# Patient Record
Sex: Female | Born: 1980
Health system: Southern US, Community
[De-identification: ages and names within clinical notes are randomized; demographics above are authoritative.]

## PROBLEM LIST (undated history)

## (undated) DIAGNOSIS — I10 Essential (primary) hypertension: Secondary | ICD-10-CM

## (undated) DIAGNOSIS — N200 Calculus of kidney: Secondary | ICD-10-CM

## (undated) DIAGNOSIS — Q613 Polycystic kidney, unspecified: Secondary | ICD-10-CM

## (undated) DIAGNOSIS — R319 Hematuria, unspecified: Secondary | ICD-10-CM

## (undated) DIAGNOSIS — Z8619 Personal history of other infectious and parasitic diseases: Secondary | ICD-10-CM

## (undated) HISTORY — DX: Personal history of other infectious and parasitic diseases: Z86.19

## (undated) HISTORY — DX: Polycystic kidney, unspecified: Q61.3

## (undated) HISTORY — PX: LITHOTRIPSY: SUR834

## (undated) HISTORY — DX: Essential (primary) hypertension: I10

## (undated) HISTORY — DX: Hematuria, unspecified: R31.9

## (undated) HISTORY — PX: TUBAL LIGATION: SHX77

## (undated) HISTORY — DX: Calculus of kidney: N20.0

---

## 2004-01-27 ENCOUNTER — Ambulatory Visit: Payer: Self-pay | Admitting: Anesthesiology

## 2004-03-03 ENCOUNTER — Ambulatory Visit: Payer: Self-pay | Admitting: Anesthesiology

## 2004-03-25 ENCOUNTER — Ambulatory Visit: Payer: Self-pay | Admitting: Anesthesiology

## 2004-04-23 ENCOUNTER — Ambulatory Visit: Payer: Self-pay | Admitting: Anesthesiology

## 2004-05-21 ENCOUNTER — Ambulatory Visit: Payer: Self-pay | Admitting: Anesthesiology

## 2004-07-15 ENCOUNTER — Ambulatory Visit: Payer: Self-pay | Admitting: Anesthesiology

## 2004-09-07 ENCOUNTER — Ambulatory Visit: Payer: Self-pay | Admitting: Anesthesiology

## 2004-10-29 ENCOUNTER — Ambulatory Visit: Payer: Self-pay | Admitting: Anesthesiology

## 2004-12-04 ENCOUNTER — Ambulatory Visit: Payer: Self-pay

## 2004-12-28 ENCOUNTER — Ambulatory Visit: Payer: Self-pay | Admitting: Anesthesiology

## 2005-02-09 ENCOUNTER — Ambulatory Visit: Payer: Self-pay | Admitting: Anesthesiology

## 2005-03-08 ENCOUNTER — Observation Stay: Payer: Self-pay | Admitting: Obstetrics & Gynecology

## 2005-03-22 ENCOUNTER — Inpatient Hospital Stay: Payer: Self-pay | Admitting: Unknown Physician Specialty

## 2005-04-08 ENCOUNTER — Ambulatory Visit: Payer: Self-pay | Admitting: Anesthesiology

## 2005-07-06 ENCOUNTER — Ambulatory Visit: Payer: Self-pay | Admitting: Anesthesiology

## 2005-07-21 ENCOUNTER — Ambulatory Visit: Payer: Self-pay | Admitting: Anesthesiology

## 2005-09-17 ENCOUNTER — Ambulatory Visit: Payer: Self-pay | Admitting: Urology

## 2005-09-21 ENCOUNTER — Ambulatory Visit: Payer: Self-pay | Admitting: Anesthesiology

## 2005-11-09 ENCOUNTER — Ambulatory Visit: Payer: Self-pay | Admitting: Anesthesiology

## 2005-12-07 ENCOUNTER — Ambulatory Visit: Payer: Self-pay | Admitting: Urology

## 2005-12-22 ENCOUNTER — Ambulatory Visit: Payer: Self-pay | Admitting: Anesthesiology

## 2006-01-18 ENCOUNTER — Ambulatory Visit: Payer: Self-pay | Admitting: Anesthesiology

## 2006-02-09 ENCOUNTER — Ambulatory Visit: Payer: Self-pay | Admitting: Anesthesiology

## 2006-03-14 ENCOUNTER — Ambulatory Visit: Payer: Self-pay | Admitting: Anesthesiology

## 2006-05-19 ENCOUNTER — Ambulatory Visit: Payer: Self-pay | Admitting: Anesthesiology

## 2006-06-15 ENCOUNTER — Ambulatory Visit: Payer: Self-pay | Admitting: Anesthesiology

## 2006-08-17 ENCOUNTER — Ambulatory Visit: Payer: Self-pay | Admitting: Anesthesiology

## 2006-09-05 ENCOUNTER — Ambulatory Visit: Payer: Self-pay | Admitting: Anesthesiology

## 2006-10-03 ENCOUNTER — Ambulatory Visit: Payer: Self-pay | Admitting: Anesthesiology

## 2006-10-07 ENCOUNTER — Ambulatory Visit: Payer: Self-pay | Admitting: Urology

## 2006-11-01 ENCOUNTER — Ambulatory Visit: Payer: Self-pay | Admitting: Anesthesiology

## 2006-11-21 ENCOUNTER — Ambulatory Visit: Payer: Self-pay | Admitting: Anesthesiology

## 2006-12-08 ENCOUNTER — Ambulatory Visit: Payer: Self-pay | Admitting: Urology

## 2006-12-14 ENCOUNTER — Ambulatory Visit: Payer: Self-pay | Admitting: Urology

## 2006-12-27 ENCOUNTER — Ambulatory Visit: Payer: Self-pay | Admitting: Anesthesiology

## 2007-01-27 ENCOUNTER — Ambulatory Visit: Payer: Self-pay | Admitting: Anesthesiology

## 2007-02-21 ENCOUNTER — Ambulatory Visit: Payer: Self-pay | Admitting: Anesthesiology

## 2007-03-23 ENCOUNTER — Ambulatory Visit: Payer: Self-pay | Admitting: Anesthesiology

## 2007-04-03 ENCOUNTER — Ambulatory Visit: Payer: Self-pay | Admitting: Urology

## 2007-04-26 ENCOUNTER — Ambulatory Visit: Payer: Self-pay | Admitting: Anesthesiology

## 2007-05-26 ENCOUNTER — Ambulatory Visit: Payer: Self-pay | Admitting: Anesthesiology

## 2007-06-26 ENCOUNTER — Ambulatory Visit: Payer: Self-pay | Admitting: Anesthesiology

## 2007-07-26 ENCOUNTER — Ambulatory Visit: Payer: Self-pay | Admitting: Anesthesiology

## 2007-08-30 ENCOUNTER — Ambulatory Visit: Payer: Self-pay | Admitting: Anesthesiology

## 2007-09-29 ENCOUNTER — Ambulatory Visit: Payer: Self-pay | Admitting: Anesthesiology

## 2007-10-26 ENCOUNTER — Ambulatory Visit: Payer: Self-pay | Admitting: Anesthesiology

## 2007-11-20 ENCOUNTER — Ambulatory Visit: Payer: Self-pay | Admitting: Anesthesiology

## 2007-11-29 ENCOUNTER — Ambulatory Visit: Payer: Self-pay | Admitting: Unknown Physician Specialty

## 2007-12-26 ENCOUNTER — Ambulatory Visit: Payer: Self-pay | Admitting: Anesthesiology

## 2008-01-23 ENCOUNTER — Ambulatory Visit: Payer: Self-pay | Admitting: Anesthesiology

## 2008-02-22 ENCOUNTER — Ambulatory Visit: Payer: Self-pay | Admitting: Anesthesiology

## 2008-04-04 ENCOUNTER — Encounter: Payer: Self-pay | Admitting: Maternal & Fetal Medicine

## 2008-04-09 ENCOUNTER — Ambulatory Visit: Payer: Self-pay | Admitting: Obstetrics & Gynecology

## 2008-04-25 ENCOUNTER — Ambulatory Visit: Payer: Self-pay | Admitting: Anesthesiology

## 2008-05-16 ENCOUNTER — Encounter: Payer: Self-pay | Admitting: Maternal and Fetal Medicine

## 2008-05-20 ENCOUNTER — Encounter: Payer: Self-pay | Admitting: Obstetrics and Gynecology

## 2008-06-18 ENCOUNTER — Ambulatory Visit: Payer: Self-pay | Admitting: Anesthesiology

## 2008-06-27 ENCOUNTER — Encounter: Payer: Self-pay | Admitting: Obstetrics and Gynecology

## 2008-07-08 ENCOUNTER — Encounter: Payer: Self-pay | Admitting: Obstetrics and Gynecology

## 2008-08-13 ENCOUNTER — Ambulatory Visit: Payer: Self-pay | Admitting: Anesthesiology

## 2008-09-11 ENCOUNTER — Ambulatory Visit: Payer: Self-pay | Admitting: Anesthesiology

## 2008-10-21 ENCOUNTER — Inpatient Hospital Stay: Payer: Self-pay | Admitting: Obstetrics and Gynecology

## 2008-11-18 ENCOUNTER — Ambulatory Visit: Payer: Self-pay | Admitting: Anesthesiology

## 2008-12-09 ENCOUNTER — Ambulatory Visit: Payer: Self-pay | Admitting: Urology

## 2008-12-13 ENCOUNTER — Ambulatory Visit: Payer: Self-pay | Admitting: Anesthesiology

## 2009-01-14 ENCOUNTER — Ambulatory Visit: Payer: Self-pay | Admitting: Anesthesiology

## 2009-02-10 ENCOUNTER — Ambulatory Visit: Payer: Self-pay | Admitting: Anesthesiology

## 2009-03-19 ENCOUNTER — Ambulatory Visit: Payer: Self-pay | Admitting: Anesthesiology

## 2009-04-22 ENCOUNTER — Ambulatory Visit: Payer: Self-pay | Admitting: Urology

## 2009-04-25 ENCOUNTER — Ambulatory Visit: Payer: Self-pay | Admitting: Urology

## 2009-05-07 ENCOUNTER — Ambulatory Visit: Payer: Self-pay | Admitting: Urology

## 2009-05-14 ENCOUNTER — Ambulatory Visit: Payer: Self-pay | Admitting: Anesthesiology

## 2009-05-22 ENCOUNTER — Ambulatory Visit: Payer: Self-pay | Admitting: Urology

## 2009-06-05 ENCOUNTER — Ambulatory Visit: Payer: Self-pay | Admitting: Urology

## 2009-06-23 ENCOUNTER — Ambulatory Visit: Payer: Self-pay | Admitting: Anesthesiology

## 2009-08-12 ENCOUNTER — Ambulatory Visit: Payer: Self-pay | Admitting: Anesthesiology

## 2009-10-08 ENCOUNTER — Ambulatory Visit: Payer: Self-pay | Admitting: Urology

## 2009-10-16 ENCOUNTER — Ambulatory Visit: Payer: Self-pay | Admitting: Anesthesiology

## 2009-12-31 ENCOUNTER — Ambulatory Visit: Payer: Self-pay | Admitting: Anesthesiology

## 2010-03-24 ENCOUNTER — Ambulatory Visit: Payer: Self-pay | Admitting: Anesthesiology

## 2010-04-01 ENCOUNTER — Ambulatory Visit: Payer: Self-pay | Admitting: Urology

## 2010-05-14 ENCOUNTER — Ambulatory Visit: Payer: Self-pay | Admitting: Anesthesiology

## 2010-07-06 ENCOUNTER — Ambulatory Visit: Payer: Self-pay | Admitting: Anesthesiology

## 2010-09-08 ENCOUNTER — Ambulatory Visit: Payer: Self-pay | Admitting: Anesthesiology

## 2010-10-05 ENCOUNTER — Ambulatory Visit: Payer: Self-pay | Admitting: Urology

## 2010-11-03 ENCOUNTER — Ambulatory Visit: Payer: Self-pay | Admitting: Anesthesiology

## 2011-01-01 ENCOUNTER — Ambulatory Visit: Payer: Self-pay | Admitting: Anesthesiology

## 2011-02-24 ENCOUNTER — Ambulatory Visit: Payer: Self-pay | Admitting: Anesthesiology

## 2011-04-21 ENCOUNTER — Ambulatory Visit: Payer: Self-pay | Admitting: Urology

## 2011-04-21 DIAGNOSIS — N23 Unspecified renal colic: Secondary | ICD-10-CM | POA: Diagnosis not present

## 2011-04-21 DIAGNOSIS — Q612 Polycystic kidney, adult type: Secondary | ICD-10-CM | POA: Diagnosis not present

## 2011-04-21 DIAGNOSIS — N200 Calculus of kidney: Secondary | ICD-10-CM | POA: Diagnosis not present

## 2011-04-23 ENCOUNTER — Ambulatory Visit: Payer: Self-pay | Admitting: Urology

## 2011-04-23 DIAGNOSIS — N200 Calculus of kidney: Secondary | ICD-10-CM | POA: Diagnosis not present

## 2011-04-23 DIAGNOSIS — N23 Unspecified renal colic: Secondary | ICD-10-CM | POA: Diagnosis not present

## 2011-04-23 DIAGNOSIS — N281 Cyst of kidney, acquired: Secondary | ICD-10-CM | POA: Diagnosis not present

## 2011-04-23 DIAGNOSIS — Q618 Other cystic kidney diseases: Secondary | ICD-10-CM | POA: Diagnosis not present

## 2011-04-27 ENCOUNTER — Ambulatory Visit: Payer: Self-pay | Admitting: Anesthesiology

## 2011-04-27 DIAGNOSIS — I10 Essential (primary) hypertension: Secondary | ICD-10-CM | POA: Diagnosis not present

## 2011-04-27 DIAGNOSIS — G894 Chronic pain syndrome: Secondary | ICD-10-CM | POA: Diagnosis not present

## 2011-04-27 DIAGNOSIS — Z9889 Other specified postprocedural states: Secondary | ICD-10-CM | POA: Diagnosis not present

## 2011-04-27 DIAGNOSIS — Z79899 Other long term (current) drug therapy: Secondary | ICD-10-CM | POA: Diagnosis not present

## 2011-04-27 DIAGNOSIS — M545 Low back pain: Secondary | ICD-10-CM | POA: Diagnosis not present

## 2011-04-27 DIAGNOSIS — Q613 Polycystic kidney, unspecified: Secondary | ICD-10-CM | POA: Diagnosis not present

## 2011-04-27 DIAGNOSIS — Z87891 Personal history of nicotine dependence: Secondary | ICD-10-CM | POA: Diagnosis not present

## 2011-07-07 ENCOUNTER — Ambulatory Visit: Payer: Self-pay | Admitting: Anesthesiology

## 2011-07-07 DIAGNOSIS — Z79899 Other long term (current) drug therapy: Secondary | ICD-10-CM | POA: Diagnosis not present

## 2011-07-07 DIAGNOSIS — I10 Essential (primary) hypertension: Secondary | ICD-10-CM | POA: Diagnosis not present

## 2011-07-07 DIAGNOSIS — F17200 Nicotine dependence, unspecified, uncomplicated: Secondary | ICD-10-CM | POA: Diagnosis not present

## 2011-07-07 DIAGNOSIS — Q613 Polycystic kidney, unspecified: Secondary | ICD-10-CM | POA: Diagnosis not present

## 2011-09-03 DIAGNOSIS — F339 Major depressive disorder, recurrent, unspecified: Secondary | ICD-10-CM | POA: Diagnosis not present

## 2011-09-07 ENCOUNTER — Ambulatory Visit: Payer: Self-pay | Admitting: Anesthesiology

## 2011-09-07 DIAGNOSIS — R109 Unspecified abdominal pain: Secondary | ICD-10-CM | POA: Diagnosis not present

## 2011-09-07 DIAGNOSIS — Z79899 Other long term (current) drug therapy: Secondary | ICD-10-CM | POA: Diagnosis not present

## 2011-09-07 DIAGNOSIS — Q613 Polycystic kidney, unspecified: Secondary | ICD-10-CM | POA: Diagnosis not present

## 2011-10-01 DIAGNOSIS — F339 Major depressive disorder, recurrent, unspecified: Secondary | ICD-10-CM | POA: Diagnosis not present

## 2011-11-03 ENCOUNTER — Ambulatory Visit: Payer: Self-pay | Admitting: Urology

## 2011-11-03 DIAGNOSIS — N23 Unspecified renal colic: Secondary | ICD-10-CM | POA: Diagnosis not present

## 2011-11-03 DIAGNOSIS — N201 Calculus of ureter: Secondary | ICD-10-CM | POA: Diagnosis not present

## 2011-11-03 DIAGNOSIS — R5381 Other malaise: Secondary | ICD-10-CM | POA: Diagnosis not present

## 2011-11-03 DIAGNOSIS — Q612 Polycystic kidney, adult type: Secondary | ICD-10-CM | POA: Diagnosis not present

## 2011-11-03 DIAGNOSIS — R310 Gross hematuria: Secondary | ICD-10-CM | POA: Diagnosis not present

## 2011-11-03 DIAGNOSIS — N200 Calculus of kidney: Secondary | ICD-10-CM | POA: Diagnosis not present

## 2011-11-03 LAB — TSH: TSH: 4.99 u[IU]/mL (ref <=5.90)

## 2011-11-05 DIAGNOSIS — N200 Calculus of kidney: Secondary | ICD-10-CM | POA: Diagnosis not present

## 2011-11-06 DIAGNOSIS — F339 Major depressive disorder, recurrent, unspecified: Secondary | ICD-10-CM | POA: Diagnosis not present

## 2011-11-18 DIAGNOSIS — F17200 Nicotine dependence, unspecified, uncomplicated: Secondary | ICD-10-CM | POA: Diagnosis not present

## 2011-11-18 DIAGNOSIS — E039 Hypothyroidism, unspecified: Secondary | ICD-10-CM | POA: Diagnosis not present

## 2011-11-29 ENCOUNTER — Ambulatory Visit: Payer: Self-pay | Admitting: Anesthesiology

## 2011-11-29 DIAGNOSIS — R109 Unspecified abdominal pain: Secondary | ICD-10-CM | POA: Diagnosis not present

## 2011-11-29 DIAGNOSIS — Q613 Polycystic kidney, unspecified: Secondary | ICD-10-CM | POA: Diagnosis not present

## 2011-11-29 DIAGNOSIS — Z79899 Other long term (current) drug therapy: Secondary | ICD-10-CM | POA: Diagnosis not present

## 2011-12-03 DIAGNOSIS — F339 Major depressive disorder, recurrent, unspecified: Secondary | ICD-10-CM | POA: Diagnosis not present

## 2011-12-24 ENCOUNTER — Ambulatory Visit: Payer: Self-pay | Admitting: Urology

## 2011-12-24 DIAGNOSIS — Q612 Polycystic kidney, adult type: Secondary | ICD-10-CM | POA: Diagnosis not present

## 2011-12-24 DIAGNOSIS — R300 Dysuria: Secondary | ICD-10-CM | POA: Diagnosis not present

## 2011-12-24 DIAGNOSIS — N23 Unspecified renal colic: Secondary | ICD-10-CM | POA: Diagnosis not present

## 2011-12-24 DIAGNOSIS — R109 Unspecified abdominal pain: Secondary | ICD-10-CM | POA: Diagnosis not present

## 2011-12-24 DIAGNOSIS — N200 Calculus of kidney: Secondary | ICD-10-CM | POA: Diagnosis not present

## 2011-12-30 ENCOUNTER — Ambulatory Visit: Payer: Self-pay | Admitting: Urology

## 2011-12-30 DIAGNOSIS — Q612 Polycystic kidney, adult type: Secondary | ICD-10-CM | POA: Diagnosis not present

## 2011-12-30 DIAGNOSIS — Z8489 Family history of other specified conditions: Secondary | ICD-10-CM | POA: Diagnosis not present

## 2011-12-30 DIAGNOSIS — F17200 Nicotine dependence, unspecified, uncomplicated: Secondary | ICD-10-CM | POA: Diagnosis not present

## 2011-12-30 DIAGNOSIS — R310 Gross hematuria: Secondary | ICD-10-CM | POA: Diagnosis not present

## 2011-12-30 DIAGNOSIS — N200 Calculus of kidney: Secondary | ICD-10-CM | POA: Diagnosis not present

## 2011-12-30 DIAGNOSIS — Z79899 Other long term (current) drug therapy: Secondary | ICD-10-CM | POA: Diagnosis not present

## 2011-12-30 DIAGNOSIS — R5381 Other malaise: Secondary | ICD-10-CM | POA: Diagnosis not present

## 2012-01-01 DIAGNOSIS — F339 Major depressive disorder, recurrent, unspecified: Secondary | ICD-10-CM | POA: Diagnosis not present

## 2012-01-22 DIAGNOSIS — F339 Major depressive disorder, recurrent, unspecified: Secondary | ICD-10-CM | POA: Diagnosis not present

## 2012-01-24 DIAGNOSIS — Z23 Encounter for immunization: Secondary | ICD-10-CM | POA: Diagnosis not present

## 2012-01-27 ENCOUNTER — Ambulatory Visit: Payer: Self-pay | Admitting: Urology

## 2012-01-27 DIAGNOSIS — R310 Gross hematuria: Secondary | ICD-10-CM | POA: Diagnosis not present

## 2012-01-27 DIAGNOSIS — Q612 Polycystic kidney, adult type: Secondary | ICD-10-CM | POA: Diagnosis not present

## 2012-01-27 DIAGNOSIS — N23 Unspecified renal colic: Secondary | ICD-10-CM | POA: Diagnosis not present

## 2012-01-27 DIAGNOSIS — N200 Calculus of kidney: Secondary | ICD-10-CM | POA: Diagnosis not present

## 2012-02-19 DIAGNOSIS — F339 Major depressive disorder, recurrent, unspecified: Secondary | ICD-10-CM | POA: Diagnosis not present

## 2012-03-06 ENCOUNTER — Ambulatory Visit: Payer: Self-pay | Admitting: Anesthesiology

## 2012-03-06 DIAGNOSIS — R109 Unspecified abdominal pain: Secondary | ICD-10-CM | POA: Diagnosis not present

## 2012-03-06 DIAGNOSIS — Z79899 Other long term (current) drug therapy: Secondary | ICD-10-CM | POA: Diagnosis not present

## 2012-03-06 DIAGNOSIS — Q613 Polycystic kidney, unspecified: Secondary | ICD-10-CM | POA: Diagnosis not present

## 2012-03-18 DIAGNOSIS — F339 Major depressive disorder, recurrent, unspecified: Secondary | ICD-10-CM | POA: Diagnosis not present

## 2012-04-15 DIAGNOSIS — F339 Major depressive disorder, recurrent, unspecified: Secondary | ICD-10-CM | POA: Diagnosis not present

## 2012-05-13 DIAGNOSIS — F339 Major depressive disorder, recurrent, unspecified: Secondary | ICD-10-CM | POA: Diagnosis not present

## 2012-05-30 ENCOUNTER — Ambulatory Visit: Payer: Self-pay | Admitting: Anesthesiology

## 2012-05-30 DIAGNOSIS — R109 Unspecified abdominal pain: Secondary | ICD-10-CM | POA: Diagnosis not present

## 2012-05-30 DIAGNOSIS — Q613 Polycystic kidney, unspecified: Secondary | ICD-10-CM | POA: Diagnosis not present

## 2012-05-30 DIAGNOSIS — Z79899 Other long term (current) drug therapy: Secondary | ICD-10-CM | POA: Diagnosis not present

## 2012-05-30 DIAGNOSIS — M545 Low back pain: Secondary | ICD-10-CM | POA: Diagnosis not present

## 2012-06-10 DIAGNOSIS — F339 Major depressive disorder, recurrent, unspecified: Secondary | ICD-10-CM | POA: Diagnosis not present

## 2012-07-08 DIAGNOSIS — F339 Major depressive disorder, recurrent, unspecified: Secondary | ICD-10-CM | POA: Diagnosis not present

## 2012-07-18 ENCOUNTER — Ambulatory Visit: Payer: Self-pay | Admitting: Urology

## 2012-07-18 DIAGNOSIS — N200 Calculus of kidney: Secondary | ICD-10-CM | POA: Diagnosis not present

## 2012-07-18 DIAGNOSIS — N23 Unspecified renal colic: Secondary | ICD-10-CM | POA: Diagnosis not present

## 2012-07-21 DIAGNOSIS — N23 Unspecified renal colic: Secondary | ICD-10-CM | POA: Diagnosis not present

## 2012-07-21 DIAGNOSIS — Q612 Polycystic kidney, adult type: Secondary | ICD-10-CM | POA: Diagnosis not present

## 2012-07-21 DIAGNOSIS — N200 Calculus of kidney: Secondary | ICD-10-CM | POA: Diagnosis not present

## 2012-08-01 DIAGNOSIS — N281 Cyst of kidney, acquired: Secondary | ICD-10-CM | POA: Diagnosis not present

## 2012-08-01 DIAGNOSIS — N200 Calculus of kidney: Secondary | ICD-10-CM | POA: Diagnosis not present

## 2012-08-01 DIAGNOSIS — Q613 Polycystic kidney, unspecified: Secondary | ICD-10-CM | POA: Diagnosis not present

## 2012-08-03 DIAGNOSIS — Q612 Polycystic kidney, adult type: Secondary | ICD-10-CM | POA: Insufficient documentation

## 2012-08-03 DIAGNOSIS — N200 Calculus of kidney: Secondary | ICD-10-CM | POA: Diagnosis not present

## 2012-08-03 DIAGNOSIS — R310 Gross hematuria: Secondary | ICD-10-CM | POA: Diagnosis not present

## 2012-08-03 DIAGNOSIS — N23 Unspecified renal colic: Secondary | ICD-10-CM | POA: Diagnosis not present

## 2012-08-05 DIAGNOSIS — F339 Major depressive disorder, recurrent, unspecified: Secondary | ICD-10-CM | POA: Diagnosis not present

## 2012-09-05 ENCOUNTER — Ambulatory Visit: Payer: Self-pay | Admitting: Anesthesiology

## 2012-09-05 DIAGNOSIS — R109 Unspecified abdominal pain: Secondary | ICD-10-CM | POA: Diagnosis not present

## 2012-09-05 DIAGNOSIS — M545 Low back pain: Secondary | ICD-10-CM | POA: Diagnosis not present

## 2012-09-05 DIAGNOSIS — G894 Chronic pain syndrome: Secondary | ICD-10-CM | POA: Diagnosis not present

## 2012-09-05 DIAGNOSIS — Z79899 Other long term (current) drug therapy: Secondary | ICD-10-CM | POA: Diagnosis not present

## 2012-09-05 DIAGNOSIS — Q613 Polycystic kidney, unspecified: Secondary | ICD-10-CM | POA: Diagnosis not present

## 2012-09-30 DIAGNOSIS — F339 Major depressive disorder, recurrent, unspecified: Secondary | ICD-10-CM | POA: Diagnosis not present

## 2012-11-28 ENCOUNTER — Ambulatory Visit: Payer: Self-pay | Admitting: Anesthesiology

## 2012-11-28 DIAGNOSIS — R109 Unspecified abdominal pain: Secondary | ICD-10-CM | POA: Diagnosis not present

## 2012-11-28 DIAGNOSIS — Q613 Polycystic kidney, unspecified: Secondary | ICD-10-CM | POA: Diagnosis not present

## 2012-11-28 DIAGNOSIS — Z79899 Other long term (current) drug therapy: Secondary | ICD-10-CM | POA: Diagnosis not present

## 2012-12-05 DIAGNOSIS — Z23 Encounter for immunization: Secondary | ICD-10-CM | POA: Diagnosis not present

## 2012-12-23 DIAGNOSIS — F339 Major depressive disorder, recurrent, unspecified: Secondary | ICD-10-CM | POA: Diagnosis not present

## 2013-01-19 ENCOUNTER — Ambulatory Visit: Payer: Self-pay | Admitting: Urology

## 2013-01-19 DIAGNOSIS — K5900 Constipation, unspecified: Secondary | ICD-10-CM | POA: Diagnosis not present

## 2013-01-19 DIAGNOSIS — N23 Unspecified renal colic: Secondary | ICD-10-CM | POA: Diagnosis not present

## 2013-01-19 DIAGNOSIS — N200 Calculus of kidney: Secondary | ICD-10-CM | POA: Diagnosis not present

## 2013-01-24 DIAGNOSIS — Q612 Polycystic kidney, adult type: Secondary | ICD-10-CM | POA: Diagnosis not present

## 2013-01-24 DIAGNOSIS — N23 Unspecified renal colic: Secondary | ICD-10-CM | POA: Diagnosis not present

## 2013-01-24 DIAGNOSIS — N200 Calculus of kidney: Secondary | ICD-10-CM | POA: Diagnosis not present

## 2013-02-19 DIAGNOSIS — M7650 Patellar tendinitis, unspecified knee: Secondary | ICD-10-CM | POA: Diagnosis not present

## 2013-02-19 DIAGNOSIS — E039 Hypothyroidism, unspecified: Secondary | ICD-10-CM | POA: Diagnosis not present

## 2013-03-01 ENCOUNTER — Ambulatory Visit: Payer: Self-pay | Admitting: Anesthesiology

## 2013-03-01 DIAGNOSIS — M545 Low back pain, unspecified: Secondary | ICD-10-CM | POA: Diagnosis not present

## 2013-03-01 DIAGNOSIS — Z79899 Other long term (current) drug therapy: Secondary | ICD-10-CM | POA: Diagnosis not present

## 2013-03-01 DIAGNOSIS — Q613 Polycystic kidney, unspecified: Secondary | ICD-10-CM | POA: Diagnosis not present

## 2013-03-17 DIAGNOSIS — F339 Major depressive disorder, recurrent, unspecified: Secondary | ICD-10-CM | POA: Diagnosis not present

## 2013-03-20 DIAGNOSIS — M2240 Chondromalacia patellae, unspecified knee: Secondary | ICD-10-CM | POA: Diagnosis not present

## 2013-05-15 ENCOUNTER — Ambulatory Visit: Payer: Self-pay | Admitting: Anesthesiology

## 2013-05-15 DIAGNOSIS — M545 Low back pain, unspecified: Secondary | ICD-10-CM | POA: Diagnosis not present

## 2013-05-15 DIAGNOSIS — Z79899 Other long term (current) drug therapy: Secondary | ICD-10-CM | POA: Diagnosis not present

## 2013-05-15 DIAGNOSIS — Q613 Polycystic kidney, unspecified: Secondary | ICD-10-CM | POA: Diagnosis not present

## 2013-06-09 DIAGNOSIS — F339 Major depressive disorder, recurrent, unspecified: Secondary | ICD-10-CM | POA: Diagnosis not present

## 2013-08-22 ENCOUNTER — Ambulatory Visit: Payer: Self-pay | Admitting: Anesthesiology

## 2013-08-22 DIAGNOSIS — Z79899 Other long term (current) drug therapy: Secondary | ICD-10-CM | POA: Diagnosis not present

## 2013-08-22 DIAGNOSIS — Q612 Polycystic kidney, adult type: Secondary | ICD-10-CM | POA: Diagnosis not present

## 2013-08-22 DIAGNOSIS — Q613 Polycystic kidney, unspecified: Secondary | ICD-10-CM | POA: Diagnosis not present

## 2013-08-22 DIAGNOSIS — R109 Unspecified abdominal pain: Secondary | ICD-10-CM | POA: Diagnosis not present

## 2013-08-22 DIAGNOSIS — N23 Unspecified renal colic: Secondary | ICD-10-CM | POA: Diagnosis not present

## 2013-08-22 DIAGNOSIS — N200 Calculus of kidney: Secondary | ICD-10-CM | POA: Diagnosis not present

## 2013-09-01 DIAGNOSIS — F339 Major depressive disorder, recurrent, unspecified: Secondary | ICD-10-CM | POA: Diagnosis not present

## 2013-11-22 ENCOUNTER — Ambulatory Visit: Payer: Self-pay | Admitting: Anesthesiology

## 2013-11-22 DIAGNOSIS — M545 Low back pain, unspecified: Secondary | ICD-10-CM | POA: Diagnosis not present

## 2013-11-22 DIAGNOSIS — Z79899 Other long term (current) drug therapy: Secondary | ICD-10-CM | POA: Diagnosis not present

## 2013-11-22 DIAGNOSIS — Q613 Polycystic kidney, unspecified: Secondary | ICD-10-CM | POA: Diagnosis not present

## 2013-11-24 DIAGNOSIS — F339 Major depressive disorder, recurrent, unspecified: Secondary | ICD-10-CM | POA: Diagnosis not present

## 2013-12-24 DIAGNOSIS — Z23 Encounter for immunization: Secondary | ICD-10-CM | POA: Diagnosis not present

## 2014-02-13 DIAGNOSIS — N200 Calculus of kidney: Secondary | ICD-10-CM | POA: Diagnosis not present

## 2014-02-13 DIAGNOSIS — N23 Unspecified renal colic: Secondary | ICD-10-CM | POA: Diagnosis not present

## 2014-02-13 DIAGNOSIS — N3941 Urge incontinence: Secondary | ICD-10-CM | POA: Diagnosis not present

## 2014-02-13 DIAGNOSIS — Q612 Polycystic kidney, adult type: Secondary | ICD-10-CM | POA: Diagnosis not present

## 2014-02-13 DIAGNOSIS — Z720 Tobacco use: Secondary | ICD-10-CM | POA: Diagnosis not present

## 2014-02-13 DIAGNOSIS — R5383 Other fatigue: Secondary | ICD-10-CM | POA: Diagnosis not present

## 2014-02-13 DIAGNOSIS — Q613 Polycystic kidney, unspecified: Secondary | ICD-10-CM | POA: Diagnosis not present

## 2014-02-13 DIAGNOSIS — R934 Abnormal findings on diagnostic imaging of urinary organs: Secondary | ICD-10-CM | POA: Diagnosis not present

## 2014-02-13 DIAGNOSIS — R5381 Other malaise: Secondary | ICD-10-CM | POA: Diagnosis not present

## 2014-02-13 DIAGNOSIS — R310 Gross hematuria: Secondary | ICD-10-CM | POA: Diagnosis not present

## 2014-02-13 DIAGNOSIS — Z79899 Other long term (current) drug therapy: Secondary | ICD-10-CM | POA: Diagnosis not present

## 2014-02-16 DIAGNOSIS — F339 Major depressive disorder, recurrent, unspecified: Secondary | ICD-10-CM | POA: Diagnosis not present

## 2014-03-19 ENCOUNTER — Ambulatory Visit: Payer: Self-pay | Admitting: Anesthesiology

## 2014-03-19 DIAGNOSIS — Z79891 Long term (current) use of opiate analgesic: Secondary | ICD-10-CM | POA: Diagnosis not present

## 2014-03-19 DIAGNOSIS — M545 Low back pain: Secondary | ICD-10-CM | POA: Diagnosis not present

## 2014-03-19 DIAGNOSIS — R109 Unspecified abdominal pain: Secondary | ICD-10-CM | POA: Diagnosis not present

## 2014-04-25 DIAGNOSIS — H1110 Unspecified conjunctival degenerations: Secondary | ICD-10-CM | POA: Diagnosis not present

## 2014-05-09 DIAGNOSIS — H1110 Unspecified conjunctival degenerations: Secondary | ICD-10-CM | POA: Diagnosis not present

## 2014-05-10 DIAGNOSIS — F339 Major depressive disorder, recurrent, unspecified: Secondary | ICD-10-CM | POA: Diagnosis not present

## 2014-06-12 ENCOUNTER — Ambulatory Visit: Payer: Self-pay | Admitting: Anesthesiology

## 2014-06-12 DIAGNOSIS — Q613 Polycystic kidney, unspecified: Secondary | ICD-10-CM | POA: Diagnosis not present

## 2014-06-12 DIAGNOSIS — I10 Essential (primary) hypertension: Secondary | ICD-10-CM | POA: Diagnosis not present

## 2014-06-12 DIAGNOSIS — F17200 Nicotine dependence, unspecified, uncomplicated: Secondary | ICD-10-CM | POA: Diagnosis not present

## 2014-06-12 DIAGNOSIS — Z79891 Long term (current) use of opiate analgesic: Secondary | ICD-10-CM | POA: Diagnosis not present

## 2014-06-12 DIAGNOSIS — M545 Low back pain: Secondary | ICD-10-CM | POA: Diagnosis not present

## 2014-07-22 DIAGNOSIS — F339 Major depressive disorder, recurrent, unspecified: Secondary | ICD-10-CM | POA: Diagnosis not present

## 2014-08-13 DIAGNOSIS — N23 Unspecified renal colic: Secondary | ICD-10-CM | POA: Diagnosis not present

## 2014-08-13 DIAGNOSIS — N200 Calculus of kidney: Secondary | ICD-10-CM | POA: Diagnosis not present

## 2014-08-13 DIAGNOSIS — Q612 Polycystic kidney, adult type: Secondary | ICD-10-CM | POA: Diagnosis not present

## 2014-09-18 ENCOUNTER — Ambulatory Visit: Payer: Medicare Other | Attending: Anesthesiology | Admitting: Anesthesiology

## 2014-09-18 ENCOUNTER — Encounter: Payer: Self-pay | Admitting: Anesthesiology

## 2014-09-18 VITALS — BP 147/95 | HR 84 | Temp 98.8°F | Resp 16 | Ht 64.0 in | Wt 160.0 lb

## 2014-09-18 DIAGNOSIS — Q613 Polycystic kidney, unspecified: Secondary | ICD-10-CM

## 2014-09-18 DIAGNOSIS — G8929 Other chronic pain: Secondary | ICD-10-CM

## 2014-09-18 DIAGNOSIS — Z79891 Long term (current) use of opiate analgesic: Secondary | ICD-10-CM | POA: Diagnosis not present

## 2014-09-18 DIAGNOSIS — M545 Low back pain, unspecified: Secondary | ICD-10-CM

## 2014-09-18 DIAGNOSIS — F1190 Opioid use, unspecified, uncomplicated: Secondary | ICD-10-CM

## 2014-09-18 MED ORDER — HYDROCODONE-ACETAMINOPHEN 10-325 MG PO TABS: 1.0000 | ORAL_TABLET | Freq: Two times a day (BID) | ORAL | Status: DC

## 2014-09-18 MED ORDER — MORPHINE SULFATE ER 60 MG PO TBCR: 60.0000 mg | EXTENDED_RELEASE_TABLET | Freq: Three times a day (TID) | ORAL | Status: DC

## 2014-09-18 NOTE — Progress Notes (Signed)
Discharged to home. Scripts given as ordered. Patient to call July-august for return before 12-19-14.

## 2014-09-18 NOTE — Progress Notes (Addendum)
Chief complaint is low back pain and flank pain  Procedure: none  History of present illness: Megan Brock continues to do reasonably  well with current medication management. Pain continues to stay  well controlled with current medications and no significant changes are  noted in baseline symptom complex. No change in lower extremity strength or function or bowel bladder function. Based on the  narcotic assessment sheet, the  patient continues to do well with this current regimen with no evidence of diverting or illicit use. She continues follow-up with Dr. Achilles Dunkope.  Physical exam pupils are equally round and reactive to light  Extraocular muscles are intact   Heart is regular rate and rhythm and lower extremity strength and function remains a baseline with no significant changes noted. She still has baseline flank muscle tenderness.  Assessment #1 chronic low back pain and flank pain #2 chronic opioid management #3 Polycystic Kidney Disease  Plan: We'll refill medications at present with return to clinic in the next 2-3 months for reevaluation. Patient is to continue with physical therapy exercises and aerobic conditioning as tolerated and continue follow-up with her primary care physician  Dr. Gwenyth BenderJames Adams 2:21 PM

## 2014-09-18 NOTE — Patient Instructions (Signed)

## 2014-10-03 ENCOUNTER — Other Ambulatory Visit: Payer: Self-pay | Admitting: Anesthesiology

## 2014-10-23 DIAGNOSIS — F339 Major depressive disorder, recurrent, unspecified: Secondary | ICD-10-CM | POA: Diagnosis not present

## 2014-12-05 ENCOUNTER — Ambulatory Visit: Payer: Medicare Other | Attending: Anesthesiology | Admitting: Anesthesiology

## 2014-12-05 ENCOUNTER — Encounter: Payer: Self-pay | Admitting: Anesthesiology

## 2014-12-05 VITALS — BP 136/80 | HR 99 | Temp 98.3°F | Resp 18 | Ht 64.0 in | Wt 155.0 lb

## 2014-12-05 DIAGNOSIS — Q613 Polycystic kidney, unspecified: Secondary | ICD-10-CM | POA: Insufficient documentation

## 2014-12-05 DIAGNOSIS — M545 Low back pain, unspecified: Secondary | ICD-10-CM

## 2014-12-05 DIAGNOSIS — F1190 Opioid use, unspecified, uncomplicated: Secondary | ICD-10-CM | POA: Diagnosis not present

## 2014-12-05 DIAGNOSIS — G8929 Other chronic pain: Secondary | ICD-10-CM | POA: Diagnosis not present

## 2014-12-05 DIAGNOSIS — R109 Unspecified abdominal pain: Secondary | ICD-10-CM | POA: Diagnosis present

## 2014-12-05 MED ORDER — MORPHINE SULFATE ER 60 MG PO TBCR: 60.0000 mg | EXTENDED_RELEASE_TABLET | Freq: Three times a day (TID) | ORAL | Status: DC

## 2014-12-05 MED ORDER — HYDROCODONE-ACETAMINOPHEN 10-325 MG PO TABS: 1.0000 | ORAL_TABLET | Freq: Two times a day (BID) | ORAL | Status: DC

## 2014-12-05 NOTE — Progress Notes (Signed)
Safety precautions to be maintained throughout the outpatient stay will include: orient to surroundings, keep bed in low position, maintain call bell within reach at all times, provide assistance with transfer out of bed and ambulation.  

## 2014-12-09 NOTE — Progress Notes (Signed)
Chief complaint is low back pain and flank pain  Procedure: none  History of present illness: CABRINI RUGGIERI continues to do reasonably  well with current medication management. Pain continues to stay  well controlled with current medications and no significant changes are  noted in baseline symptom complex. No change in lower extremity strength or function or bowel bladder function. Based on the  narcotic assessment sheet, the  patient continues to do well with this current regimen with no evidence of diverting or illicit use. She continues follow-up with Dr. Achilles Dunk. Overall she seems to be doing quite well and stable with this regimen. It is of any diverting or illicit use is noted.  Physical exam pupils are equally round and reactive to light  Extraocular muscles are intact   Heart is regular rate and rhythm and lower extremity strength and function remains a baseline with no significant changes noted. She still has baseline flank muscle tenderness.  Assessment #1 chronic low back pain and flank pain #2 chronic opioid management #3 Polycystic Kidney Disease  Plan: We'll refill medications at present with return to clinic in the next 2-3 months for reevaluation. Patient is to continue with physical therapy exercises and aerobic conditioning as tolerated and continue follow-up with her primary care physician  Dr. Gwenyth Bender 4:43 PM

## 2014-12-25 DIAGNOSIS — Z23 Encounter for immunization: Secondary | ICD-10-CM | POA: Diagnosis not present

## 2015-01-08 DIAGNOSIS — N23 Unspecified renal colic: Secondary | ICD-10-CM | POA: Diagnosis not present

## 2015-01-08 DIAGNOSIS — Q612 Polycystic kidney, adult type: Secondary | ICD-10-CM | POA: Diagnosis not present

## 2015-01-08 DIAGNOSIS — N200 Calculus of kidney: Secondary | ICD-10-CM | POA: Diagnosis not present

## 2015-01-08 DIAGNOSIS — R938 Abnormal findings on diagnostic imaging of other specified body structures: Secondary | ICD-10-CM | POA: Diagnosis not present

## 2015-01-21 DIAGNOSIS — F339 Major depressive disorder, recurrent, unspecified: Secondary | ICD-10-CM | POA: Diagnosis not present

## 2015-03-20 ENCOUNTER — Encounter: Payer: Self-pay | Admitting: Anesthesiology

## 2015-03-20 ENCOUNTER — Ambulatory Visit: Payer: Medicare Other | Attending: Anesthesiology | Admitting: Anesthesiology

## 2015-03-20 ENCOUNTER — Other Ambulatory Visit: Payer: Self-pay | Admitting: Anesthesiology

## 2015-03-20 VITALS — BP 146/99 | HR 117 | Temp 98.4°F | Resp 18 | Ht 64.0 in | Wt 155.0 lb

## 2015-03-20 DIAGNOSIS — R109 Unspecified abdominal pain: Secondary | ICD-10-CM | POA: Diagnosis present

## 2015-03-20 DIAGNOSIS — F1190 Opioid use, unspecified, uncomplicated: Secondary | ICD-10-CM | POA: Insufficient documentation

## 2015-03-20 DIAGNOSIS — M545 Low back pain, unspecified: Secondary | ICD-10-CM

## 2015-03-20 DIAGNOSIS — Q613 Polycystic kidney, unspecified: Secondary | ICD-10-CM | POA: Diagnosis not present

## 2015-03-20 DIAGNOSIS — G8929 Other chronic pain: Secondary | ICD-10-CM | POA: Diagnosis not present

## 2015-03-20 MED ORDER — HYDROCODONE-ACETAMINOPHEN 10-325 MG PO TABS: 1.0000 | ORAL_TABLET | Freq: Two times a day (BID) | ORAL | Status: DC

## 2015-03-20 MED ORDER — MORPHINE SULFATE ER 60 MG PO TBCR: 60.0000 mg | EXTENDED_RELEASE_TABLET | Freq: Three times a day (TID) | ORAL | Status: DC

## 2015-03-20 MED ORDER — HYDROCODONE-ACETAMINOPHEN 10-325 MG PO TABS: 1.0000 | ORAL_TABLET | Freq: Four times a day (QID) | ORAL | Status: DC | PRN

## 2015-03-20 NOTE — Progress Notes (Signed)
[facility], was put on bedrest for 3 weeks.

## 2015-03-21 NOTE — Progress Notes (Signed)
Chief complaint is low back pain and flank pain  Procedure: none  History of present illness: Stefanie Libelshley D Lehmkuhl continues to do reasonably  well with current medication management. Pain continues to stay  well controlled with current medications and no significant changes are  noted in baseline symptom complex. No change in lower extremity strength or function or bowel bladder function. She did have a recent evaluation with Dr. cope secondary to a sudden increase in her flank pain which was attributed to the bursting of a cyst in her kidney. Since then her pain is under more reasonable control and more well managed by her medication regimen which has been stable. Based on the  narcotic assessment sheet, the  patient continues to do well with this current regimen with no evidence of diverting or illicit use. She continues follow-up with Dr. Achilles Dunkope. Overall she seems to be doing quite well and stable with this regimen.  Physical exam pupils are equally round and reactive to light  Extraocular muscles are intact   Heart is regular rate and rhythm and lower extremity strength and function remains a baseline with no significant changes noted. She still has baseline flank muscle tenderness.  Assessment #1 chronic low back pain and flank pain #2 chronic opioid management #3 Polycystic Kidney Disease  Plan: We'll refill medications at present with return to clinic in the next 2 months for reevaluation. Patient is to continue with physical therapy exercises and aerobic conditioning as tolerated and continue follow-up with her primary care physician  Dr. Gwenyth BenderJames Adams 4:54 PM

## 2015-03-28 LAB — TOXASSURE SELECT 13 (MW), URINE: PDF: 0

## 2015-05-14 ENCOUNTER — Ambulatory Visit: Payer: Medicare Other | Attending: Anesthesiology | Admitting: Anesthesiology

## 2015-05-14 ENCOUNTER — Encounter: Payer: Self-pay | Admitting: Anesthesiology

## 2015-05-14 VITALS — BP 150/80 | HR 120 | Temp 98.3°F | Resp 18 | Ht 64.0 in | Wt 155.0 lb

## 2015-05-14 DIAGNOSIS — R109 Unspecified abdominal pain: Secondary | ICD-10-CM | POA: Diagnosis not present

## 2015-05-14 DIAGNOSIS — Q613 Polycystic kidney, unspecified: Secondary | ICD-10-CM | POA: Diagnosis not present

## 2015-05-14 DIAGNOSIS — F339 Major depressive disorder, recurrent, unspecified: Secondary | ICD-10-CM | POA: Diagnosis not present

## 2015-05-14 DIAGNOSIS — M545 Low back pain, unspecified: Secondary | ICD-10-CM

## 2015-05-14 DIAGNOSIS — G8929 Other chronic pain: Secondary | ICD-10-CM | POA: Diagnosis not present

## 2015-05-14 DIAGNOSIS — F1190 Opioid use, unspecified, uncomplicated: Secondary | ICD-10-CM

## 2015-05-14 MED ORDER — HYDROCODONE-ACETAMINOPHEN 10-325 MG PO TABS: 1.0000 | ORAL_TABLET | Freq: Four times a day (QID) | ORAL | Status: DC | PRN

## 2015-05-14 MED ORDER — MORPHINE SULFATE ER 60 MG PO TBCR: 60.0000 mg | EXTENDED_RELEASE_TABLET | Freq: Three times a day (TID) | ORAL | Status: DC

## 2015-05-14 NOTE — Progress Notes (Signed)
Safety precautions to be maintained throughout the outpatient stay will include: orient to surroundings, keep bed in low position, maintain call bell within reach at all times, provide assistance with transfer out of bed and ambulation.  

## 2015-05-15 NOTE — Progress Notes (Signed)
Chief complaint is low back pain and flank pain  Procedure: none  History of present illness: Megan Brock continues to do reasonably  well with current medication management. Pain continues to stay  well controlled with current medications and no significant changes are  noted in baseline symptom complex. No change in lower extremity strength or function or bowel bladder function. She did have a recent evaluation with Dr. cope secondary to a sudden increase in her flank pain which was attributed to the bursting of a cyst in her kidney. Since then her pain is under more reasonable control and more well managed by her medication regimen which has been stable. Based on the  narcotic assessment sheet, the  patient continues to do well with this current regimen with no evidence of diverting or illicit use. She continues follow-up with Dr. Achilles Dunk.     Physical exam pupils are equally round and reactive to light  Extraocular muscles are intact   Heart is regular rate and rhythm and lower extremity strength and function remains a baseline with no significant changes noted. She still has baseline flank muscle tenderness.  Assessment #1 chronic low back pain and flank pain #2 chronic opioid management #3 Polycystic Kidney Disease  Plan: We'll refill medications at present with return to clinic in the next 2 months for reevaluation. Patient is to continue with physical therapy exercises and aerobic conditioning as tolerated and continue follow-up with her primary care physician furthermore I am going to have her evaluated by Dr. Laban Emperor  for consideration for a dorsal column stimulator to see if this could help manage some of the intermittent severe pain she is experiencing despite the chronic opioids that she takes.  Dr. Gwenyth Bender 10:00 AM

## 2015-07-03 ENCOUNTER — Encounter: Payer: Self-pay | Admitting: Anesthesiology

## 2015-07-03 ENCOUNTER — Ambulatory Visit: Payer: Medicare Other | Attending: Anesthesiology | Admitting: Anesthesiology

## 2015-07-03 VITALS — BP 130/65 | HR 105 | Resp 18 | Ht 64.0 in | Wt 155.0 lb

## 2015-07-03 DIAGNOSIS — G8929 Other chronic pain: Secondary | ICD-10-CM | POA: Diagnosis not present

## 2015-07-03 DIAGNOSIS — R109 Unspecified abdominal pain: Secondary | ICD-10-CM | POA: Insufficient documentation

## 2015-07-03 DIAGNOSIS — M545 Low back pain, unspecified: Secondary | ICD-10-CM

## 2015-07-03 DIAGNOSIS — Q613 Polycystic kidney, unspecified: Secondary | ICD-10-CM

## 2015-07-03 DIAGNOSIS — F1120 Opioid dependence, uncomplicated: Secondary | ICD-10-CM | POA: Diagnosis not present

## 2015-07-03 DIAGNOSIS — F1190 Opioid use, unspecified, uncomplicated: Secondary | ICD-10-CM

## 2015-07-03 MED ORDER — HYDROCODONE-ACETAMINOPHEN 10-325 MG PO TABS: 1.0000 | ORAL_TABLET | Freq: Four times a day (QID) | ORAL | Status: DC | PRN

## 2015-07-03 MED ORDER — MORPHINE SULFATE ER 60 MG PO TBCR: 60.0000 mg | EXTENDED_RELEASE_TABLET | Freq: Three times a day (TID) | ORAL | Status: DC

## 2015-07-03 NOTE — Progress Notes (Signed)
Safety precautions to be maintained throughout the outpatient stay will include: orient to surroundings, keep bed in low position, maintain call bell within reach at all times, provide assistance with transfer out of bed and ambulation.  

## 2015-07-04 ENCOUNTER — Encounter: Payer: Self-pay | Admitting: Anesthesiology

## 2015-07-04 DIAGNOSIS — K759 Inflammatory liver disease, unspecified: Secondary | ICD-10-CM | POA: Insufficient documentation

## 2015-07-04 NOTE — Progress Notes (Signed)
Chief complaint is low back pain and flank pain  Procedure: none  History of present illness: Megan Brock presents today for reevaluation. The characteristic distribution of her pain has been stable and she is tolerating her medications well. She continues to follow-up with Dr.Cope. No significant changes are noted in her pain complaint and based on her narcotic assessment sheet she is doing well    Physical exam pupils are equally round and reactive to light  Extraocular muscles are intact   Heart is regular rate and rhythm and lower extremity strength and function remains a baseline with no significant changes noted. She still has baseline flank muscle tenderness.  Assessment #1 chronic low back pain and flank pain #2 chronic opioid management #3 Polycystic Kidney Disease  Plan: We'll refill medications at present with return to clinic in the next 2 months for reevaluation. Patient is to continue with physical therapy exercises and aerobic conditioning as tolerated and continue follow-up with her primary care physician furthermore I am going to have her evaluated by Dr. Laban Brock  for consideration for a dorsal column stimulator to see if this could help manage some of the intermittent severe pain she is experiencing despite the chronic opioids that she takes.  Dr. Gwenyth BenderJames Faustine Brock 5:21 PM

## 2015-07-15 DIAGNOSIS — Q612 Polycystic kidney, adult type: Secondary | ICD-10-CM | POA: Diagnosis not present

## 2015-07-15 DIAGNOSIS — R5381 Other malaise: Secondary | ICD-10-CM | POA: Diagnosis not present

## 2015-07-15 DIAGNOSIS — N23 Unspecified renal colic: Secondary | ICD-10-CM | POA: Diagnosis not present

## 2015-07-15 DIAGNOSIS — N200 Calculus of kidney: Secondary | ICD-10-CM | POA: Diagnosis not present

## 2015-07-15 DIAGNOSIS — R5383 Other fatigue: Secondary | ICD-10-CM | POA: Diagnosis not present

## 2015-07-15 DIAGNOSIS — R310 Gross hematuria: Secondary | ICD-10-CM | POA: Diagnosis not present

## 2015-07-16 ENCOUNTER — Ambulatory Visit (INDEPENDENT_AMBULATORY_CARE_PROVIDER_SITE_OTHER): Payer: Medicare Other | Admitting: Family Medicine

## 2015-07-16 ENCOUNTER — Encounter: Payer: Self-pay | Admitting: Family Medicine

## 2015-07-16 VITALS — BP 144/82 | HR 93 | Temp 98.0°F | Resp 16 | Ht 64.0 in | Wt 172.0 lb

## 2015-07-16 DIAGNOSIS — E039 Hypothyroidism, unspecified: Secondary | ICD-10-CM

## 2015-07-16 DIAGNOSIS — R030 Elevated blood-pressure reading, without diagnosis of hypertension: Secondary | ICD-10-CM

## 2015-07-16 DIAGNOSIS — N200 Calculus of kidney: Secondary | ICD-10-CM

## 2015-07-16 DIAGNOSIS — Q613 Polycystic kidney, unspecified: Secondary | ICD-10-CM

## 2015-07-16 DIAGNOSIS — IMO0001 Reserved for inherently not codable concepts without codable children: Secondary | ICD-10-CM

## 2015-07-16 DIAGNOSIS — Z136 Encounter for screening for cardiovascular disorders: Secondary | ICD-10-CM | POA: Insufficient documentation

## 2015-07-16 DIAGNOSIS — F419 Anxiety disorder, unspecified: Secondary | ICD-10-CM | POA: Insufficient documentation

## 2015-07-16 NOTE — Progress Notes (Signed)
Patient ID: Megan Brock, female   DOB: March 23, 1981, 35 y.o.   MRN: [id number]       Patient: Megan Brock Female    DOB: March 23, 1981   35 y.o.   MRN: [id number] Visit Date: 07/16/2015  Today's Provider: Mila Merryonald Fisher, MD   Chief Complaint  Patient presents with  . Hypertension   Subjective:    HPI Hypertension: Patient here for follow-up of elevated blood pressure. She is exercising and is not adherent to low salt diet.  Blood pressure is not well controlled at home. Cardiac symptoms none. Patient denies chest pain.  Cardiovascular risk factors: smoking/ tobacco exposure. Use of agents associated with hypertension: none. History of target organ damage: chronic kidney disease. Patient reports that at Urology yesterday her BP was 158/89 and was advised to schedule ov. She states BP has consistently in the 150s at her urology appointments. She sees. Dr Achilles Dunkope for PCKD and chronic kidney stones. She does have strong family history for hypertension. Also has family history of strokes and MI.   She states Dr. Achilles Dunkope checks her kidney functions every 6 months and it has been normal. Was last checked today be her report.      No Known Allergies Previous Medications   HYDROCODONE-ACETAMINOPHEN (NORCO) 10-325 MG TABLET    Take 1 tablet by mouth every 6 (six) hours as needed. prn   MORPHINE (MS CONTIN) 60 MG 12 HR TABLET    Take 1 tablet (60 mg total) by mouth 3 (three) times daily. prn    Review of Systems  Constitutional: Negative.  Negative for fever, chills, appetite change and fatigue.  Respiratory: Negative for chest tightness and shortness of breath.   Cardiovascular: Negative.  Negative for chest pain and palpitations.  Gastrointestinal: Negative for nausea, vomiting and abdominal pain.  Neurological: Negative for dizziness and weakness.    Social History  Substance Use Topics  . Smoking status: Current Every Day Smoker -- 1.00 packs/day    Types: Cigarettes  . Smokeless tobacco:  Never Used  . Alcohol Use: No   Family History  Problem Relation Age of Onset  . Arthritis Mother   . Hypertension Mother   . Arthritis Father   . Cancer Father   . Early death Father   . Heart disease Father   . Hypertension Father   . Kidney disease Father     POLYCYSTIC KIDNEY DISEASE  . Heart attack Father   . Hypertension Brother     Objective:   BP 130/76 mmHg  Pulse 93  Temp(Src) 98 F (36.7 C) (Oral)  Resp 16  Ht 5\' 4"  (1.626 m)  Wt 172 lb (78.019 kg)  BMI 29.51 kg/m2  SpO2 97%  LMP 07/04/2015 (Approximate)  Physical Exam   General Appearance:    Alert, cooperative, no distress  Eyes:    PERRL, conjunctiva/corneas clear, EOM's intact       Lungs:     Clear to auscultation bilaterally, respirations unlabored  Heart:    Regular rate and rhythm  Neurologic:   Awake, alert, oriented x 3. No apparent focal neurological           defect.          Assessment & Plan:     1. Elevated blood pressure Although BP is fairly good today, it has been consistently elevated outside the office. Considering this, family history of vascular diseases, and PCKD, she is interested in starting antihypertensives to help protect her kidneys. She has had  BTL so childbearing is not an issue. Discussed regular exercise, weight control and avoid sodium in diet.   2. Hypothyroidism, unspecified hypothyroidism type  - TSH  3. Screening for cardiovascular condition  - Lipid panel  4. Polycystic kidney disease Followed by Dr. Achilles Dunk  5. Nephrolithiasis Followed by Dr. Achilles Dunk.        Mila Merry, MD  [facility]

## 2015-07-17 DIAGNOSIS — Z136 Encounter for screening for cardiovascular disorders: Secondary | ICD-10-CM | POA: Diagnosis not present

## 2015-07-17 DIAGNOSIS — E039 Hypothyroidism, unspecified: Secondary | ICD-10-CM | POA: Diagnosis not present

## 2015-07-18 ENCOUNTER — Encounter: Payer: Self-pay | Admitting: Family Medicine

## 2015-07-18 ENCOUNTER — Telehealth: Payer: Self-pay | Admitting: *Deleted

## 2015-07-18 LAB — TSH: TSH: 4.62 u[IU]/mL — AB (ref 0.450–4.500)

## 2015-07-18 LAB — LIPID PANEL
CHOL/HDL RATIO: 4.2 ratio (ref 0.0–4.4)
Cholesterol, Total: 174 mg/dL (ref 100–199)
HDL: 41 mg/dL (ref >39)
LDL CALC: 105 mg/dL — AB (ref 0–99)
TRIGLYCERIDES: 139 mg/dL (ref 0–149)
VLDL Cholesterol Cal: 28 mg/dL (ref 5–40)

## 2015-07-18 MED ORDER — LISINOPRIL 5 MG PO TABS: 5.0000 mg | ORAL_TABLET | Freq: Every day | ORAL | Status: DC

## 2015-07-18 NOTE — Telephone Encounter (Signed)
Patient was notified of results. Patient expressed understanding. Rx sent to pharmacy. Scheduled f/u appt.

## 2015-07-18 NOTE — Telephone Encounter (Signed)
-----   Message from Malva Limesonald E Fisher, MD sent at 07/18/2015 10:11 AM EDT ----- Cholesterol is good at 174, normal thyroid functions. Can go ahead and start lisinopril 5mg  daily, #30, rf x 1 for blood pressure. Follow up 4 weeks for BP check .

## 2015-08-12 DIAGNOSIS — F339 Major depressive disorder, recurrent, unspecified: Secondary | ICD-10-CM | POA: Diagnosis not present

## 2015-08-18 ENCOUNTER — Encounter: Payer: Self-pay | Admitting: Family Medicine

## 2015-08-18 ENCOUNTER — Ambulatory Visit (INDEPENDENT_AMBULATORY_CARE_PROVIDER_SITE_OTHER): Payer: Medicare Other | Admitting: Family Medicine

## 2015-08-18 VITALS — BP 128/90 | HR 83 | Temp 99.2°F | Resp 16 | Ht 64.0 in | Wt 170.0 lb

## 2015-08-18 DIAGNOSIS — Q613 Polycystic kidney, unspecified: Secondary | ICD-10-CM

## 2015-08-18 DIAGNOSIS — R030 Elevated blood-pressure reading, without diagnosis of hypertension: Secondary | ICD-10-CM | POA: Diagnosis not present

## 2015-08-18 DIAGNOSIS — IMO0001 Reserved for inherently not codable concepts without codable children: Secondary | ICD-10-CM

## 2015-08-18 NOTE — Progress Notes (Signed)
       Patient: Megan Brock Female    DOB: 03-Apr-1981   35 y.o.   MRN: [id number] Visit Date: 08/18/2015  Today's Provider: Mila Merryonald Carlita Whitcomb, MD   Chief Complaint  Patient presents with  . Follow-up  . Blood Pressure Check   Subjective:    HPI     Hypertension, follow-up:  BP Readings from Last 3 Encounters:  08/18/15 120/90  07/16/15 144/82  07/03/15 130/65    She was last seen for hypertension 1 months ago.  BP at that visit was 144/82. Management since that visit includes; started lisinopril 5 mg qd.She reports good compliance with treatment. She is not having side effects. none  She is exercising. She is adherent to low salt diet.   Outside blood pressures are n/a. She is experiencing fatigue.  Patient denies none.   Cardiovascular risk factors include none.  Use of agents associated with hypertension: none.   ----------------------------------------------------------------------    No Known Allergies Previous Medications   HYDROCODONE-ACETAMINOPHEN (NORCO) 10-325 MG TABLET    Take 1 tablet by mouth every 6 (six) hours as needed. prn   LISINOPRIL (PRINIVIL,ZESTRIL) 5 MG TABLET    Take 1 tablet (5 mg total) by mouth daily.   MORPHINE (MS CONTIN) 60 MG 12 HR TABLET    Take 1 tablet (60 mg total) by mouth 3 (three) times daily. prn    Review of Systems  Constitutional: Negative for fever, chills, appetite change and fatigue.  Respiratory: Negative for chest tightness and shortness of breath.   Cardiovascular: Negative for chest pain and palpitations.  Gastrointestinal: Negative for nausea, vomiting and abdominal pain.  Neurological: Negative for dizziness and weakness.    Social History  Substance Use Topics  . Smoking status: Current Every Day Smoker -- 1.00 packs/day    Types: Cigarettes  . Smokeless tobacco: Never Used  . Alcohol Use: No   Objective:   BP 120/90 mmHg  Pulse 83  Temp(Src) 99.2 F (37.3 C) (Oral)  Resp 16  Ht 5\' 4"  (1.626 m)   Wt 170 lb (77.111 kg)  BMI 29.17 kg/m2  SpO2 98%   Repeat BP=128/90  Physical Exam  General appearance: alert, well developed, well nourished, cooperative and in no distress Head: Normocephalic, without obvious abnormality, atraumatic Lungs: Respirations even and unlabored Extremities: No gross deformities Skin: Skin color, texture, turgor normal. No rashes seen  Psych: Appropriate mood and affect. Neurologic: Mental status: Alert, oriented to person, place, and time, thought content appropriate.     Assessment & Plan:     1. Polycystic kidney disease  - Renal function panel  2. Elevated blood pressure BP is borderline on 5mg  lisinopril. If renal functions are stable will increase to 10mg  daily. She just had rx filled so will double on 5mg  tablets until gone.  - Renal function panel       Mila Merryonald Arvind Mexicano, MD  [facility]

## 2015-08-19 LAB — RENAL FUNCTION PANEL
Albumin: 4.4 g/dL (ref 3.5–5.5)
BUN / CREAT RATIO: 27 — AB (ref 9–23)
BUN: 23 mg/dL — AB (ref 6–20)
CALCIUM: 9.5 mg/dL (ref 8.7–10.2)
CO2: 19 mmol/L (ref 18–29)
CREATININE: 0.84 mg/dL (ref 0.57–1.00)
Chloride: 104 mmol/L (ref 96–106)
GFR calc non Af Amer: 92 mL/min/{1.73_m2} (ref >59)
GFR, EST AFRICAN AMERICAN: 106 mL/min/{1.73_m2} (ref >59)
Glucose: 99 mg/dL (ref 65–99)
Phosphorus: 4.2 mg/dL (ref 2.5–4.5)
Potassium: 4.2 mmol/L (ref 3.5–5.2)
SODIUM: 142 mmol/L (ref 134–144)

## 2015-08-25 ENCOUNTER — Other Ambulatory Visit: Payer: Self-pay | Admitting: Family Medicine

## 2015-08-25 MED ORDER — LISINOPRIL 10 MG PO TABS: 10.0000 mg | ORAL_TABLET | Freq: Every day | ORAL | Status: DC

## 2015-09-01 ENCOUNTER — Encounter: Payer: Self-pay | Admitting: Anesthesiology

## 2015-09-01 ENCOUNTER — Ambulatory Visit: Payer: Medicare Other | Attending: Anesthesiology | Admitting: Anesthesiology

## 2015-09-01 VITALS — BP 160/94 | HR 76 | Temp 98.4°F | Resp 16 | Ht 64.0 in | Wt 160.0 lb

## 2015-09-01 DIAGNOSIS — M545 Low back pain, unspecified: Secondary | ICD-10-CM

## 2015-09-01 DIAGNOSIS — Q613 Polycystic kidney, unspecified: Secondary | ICD-10-CM | POA: Diagnosis not present

## 2015-09-01 DIAGNOSIS — F1190 Opioid use, unspecified, uncomplicated: Secondary | ICD-10-CM | POA: Diagnosis not present

## 2015-09-01 DIAGNOSIS — R109 Unspecified abdominal pain: Secondary | ICD-10-CM | POA: Diagnosis not present

## 2015-09-01 DIAGNOSIS — G8929 Other chronic pain: Secondary | ICD-10-CM | POA: Insufficient documentation

## 2015-09-01 DIAGNOSIS — M5431 Sciatica, right side: Secondary | ICD-10-CM

## 2015-09-01 DIAGNOSIS — F1120 Opioid dependence, uncomplicated: Secondary | ICD-10-CM | POA: Insufficient documentation

## 2015-09-01 DIAGNOSIS — R52 Pain, unspecified: Secondary | ICD-10-CM | POA: Diagnosis present

## 2015-09-01 MED ORDER — HYDROCODONE-ACETAMINOPHEN 10-325 MG PO TABS: 1.0000 | ORAL_TABLET | Freq: Four times a day (QID) | ORAL | Status: DC | PRN

## 2015-09-01 MED ORDER — MORPHINE SULFATE ER 60 MG PO TBCR: 60.0000 mg | EXTENDED_RELEASE_TABLET | Freq: Three times a day (TID) | ORAL | Status: DC

## 2015-09-01 NOTE — Progress Notes (Signed)
Patient here for medication management.  Patient is being managed for chronic kidney disease.  Safety precautions to be maintained throughout the outpatient stay will include: orient to surroundings, keep bed in low position, maintain call bell within reach at all times, provide assistance with transfer out of bed and ambulation.

## 2015-09-01 NOTE — Patient Instructions (Signed)
Epidural Steroid Injection Patient Information  Description: The epidural space surrounds the nerves as they exit the spinal cord.  In some patients, the nerves can be compressed and inflamed by a bulging disc or a tight spinal canal (spinal stenosis).  By injecting steroids into the epidural space, we can bring irritated nerves into direct contact with a potentially helpful medication.  These steroids act directly on the irritated nerves and can reduce swelling and inflammation which often leads to decreased pain.  Epidural steroids may be injected anywhere along the spine and from the neck to the low back depending upon the location of your pain.   After numbing the skin with local anesthetic (like Novocaine), a small needle is passed into the epidural space slowly.  You may experience a sensation of pressure while this is being done.  The entire block usually last less than 10 minutes.  Conditions which may be treated by epidural steroids:   Low back and leg pain  Neck and arm pain  Spinal stenosis  Post-laminectomy syndrome  Herpes zoster (shingles) pain  Pain from compression fractures  Preparation for the injection:  1. Do not eat any solid food or dairy products within 8 hours of your appointment.  2. You may drink clear liquids up to 3 hours before appointment.  Clear liquids include water, black coffee, juice or soda.  No milk or cream please. 3. You may take your regular medication, including pain medications, with a sip of water before your appointment  Diabetics should hold regular insulin (if taken separately) and take 1/2 normal NPH dos the morning of the procedure.  Carry some sugar containing items with you to your appointment. 4. A driver must accompany you and be prepared to drive you home after your procedure.  5. Bring all your current medications with your. 6. An IV may be inserted and sedation may be given at the discretion of the physician.   7. A blood pressure  cuff, EKG and other monitors will often be applied during the procedure.  Some patients may need to have extra oxygen administered for a short period. 8. You will be asked to provide medical information, including your allergies, prior to the procedure.  We must know immediately if you are taking blood thinners (like Coumadin/Warfarin)  Or if you are allergic to IV iodine contrast (dye). We must know if you could possible be pregnant.  Possible side-effects:  Bleeding from needle site  Infection (rare, may require surgery)  Nerve injury (rare)  Numbness & tingling (temporary)  Difficulty urinating (rare, temporary)  Spinal headache ( a headache worse with upright posture)  Light -headedness (temporary)  Pain at injection site (several days)  Decreased blood pressure (temporary)  Weakness in arm/leg (temporary)  Pressure sensation in back/neck (temporary)  Call if you experience:  Fever/chills associated with headache or increased back/neck pain.  Headache worsened by an upright position.  New onset weakness or numbness of an extremity below the injection site  Hives or difficulty breathing (go to the emergency room)  Inflammation or drainage at the infection site  Severe back/neck pain  Any new symptoms which are concerning to you  Please note:  Although the local anesthetic injected can often make your back or neck feel good for several hours after the injection, the pain will likely return.  It takes 3-7 days for steroids to work in the epidural space.  You may not notice any pain relief for at least that one week.    If effective, we will often do a series of three injections spaced 3-6 weeks apart to maximally decrease your pain.  After the initial series, we generally will wait several months before considering a repeat injection of the same type.  If you have any questions, please call [phone] [facility] R[facility] AND  COMPLICATIONS  What are the risk, side effects and possible complications? Generally speaking, most procedures are safe.  However, with any procedure there are risks, side effects, and the possibility of complications.  The risks and complications are dependent upon the sites that are lesioned, or the type of nerve block to be performed.  The closer the procedure is to the spine, the more serious the risks are.  Great care is taken when placing the radio frequency needles, block needles or lesioning probes, but sometimes complications can occur. 1. Infection: Any time there is an injection through the skin, there is a risk of infection.  This is why sterile conditions are used for these blocks.  There are four possible types of infection. 1. Localized skin infection. 2. Central Nervous System Infection-This can be in the form of Meningitis, which can be deadly. 3. Epidural Infections-This can be in the form of an epidural abscess, which can cause pressure inside of the spine, causing compression of the spinal cord with subsequent paralysis. This would require an emergency surgery to decompress, and there are no guarantees that the patient would recover from the paralysis. 4. Discitis-This is an infection of the intervertebral discs.  It occurs in about 1% of discography procedures.  It is difficult to treat and it may lead to surgery.        2. Pain: the needles have to go through skin and soft tissues, will cause soreness.       3. Damage to internal structures:  The nerves to be lesioned may be near blood vessels or    other nerves which can be potentially damaged.       4. Bleeding: Bleeding is more common if the patient is taking blood thinners such as  aspirin, Coumadin, Ticiid, Plavix, etc., or if he/she have some genetic predisposition  such as hemophilia. Bleeding into the spinal canal can cause compression of the spinal  cord with subsequent paralysis.  This would require an emergency surgery  to  decompress and there are no guarantees that the patient would recover from the  paralysis.       5. Pneumothorax:  Puncturing of a lung is a possibility, every time a needle is introduced in  the area of the chest or upper back.  Pneumothorax refers to free air around the  collapsed lung(s), inside of the thoracic cavity (chest cavity).  Another two possible  complications related to a similar event would include: Hemothorax and Chylothorax.   These are variations of the Pneumothorax, where instead of air around the collapsed  lung(s), you may have blood or chyle, respectively.       6. Spinal headaches: They may occur with any procedures in the area of the spine.       7. Persistent CSF (Cerebro-Spinal Fluid) leakage: This is a rare problem, but may occur  with prolonged intrathecal or epidural catheters either due to the formation of a fistulous  track or a dural tear.       8. Nerve damage: By working so close to the spinal cord, there is always a possibility of  nerve damage, which could be   as serious as a permanent spinal cord injury with  paralysis.       9. Death:  Although rare, severe deadly allergic reactions known as "Anaphylactic  reaction" can occur to any of the medications used.      10. Worsening of the symptoms:  We can always make thing worse.  What are the chances of something like this happening? Chances of any of this occuring are extremely low.  By statistics, you have more of a chance of getting killed in a motor vehicle accident: while driving to the hospital than any of the above occurring .  Nevertheless, you should be aware that they are possibilities.  In general, it is similar to taking a shower.  Everybody knows that you can slip, hit your head and get killed.  Does that mean that you should not shower again?  Nevertheless always keep in mind that statistics do not mean anything if you happen to be on the wrong side of them.  Even if a procedure has a 1 (one) in a 1,000,000  (million) chance of going wrong, it you happen to be that one..Also, keep in mind that by statistics, you have more of a chance of having something go wrong when taking medications.  Who should not have this procedure? If you are on a blood thinning medication (e.g. Coumadin, Plavix, see list of "Blood Thinners"), or if you have an active infection going on, you should not have the procedure.  If you are taking any blood thinners, please inform your physician.  How should I prepare for this procedure?  Do not eat or drink anything at least six hours prior to the procedure.  Bring a driver with you .  It cannot be a taxi.  Come accompanied by an adult that can drive you back, and that is strong enough to help you if your legs get weak or numb from the local anesthetic.  Take all of your medicines the morning of the procedure with just enough water to swallow them.  If you have diabetes, make sure that you are scheduled to have your procedure done first thing in the morning, whenever possible.  If you have diabetes, take only half of your insulin dose and notify our nurse that you have done so as soon as you arrive at the clinic.  If you are diabetic, but only take blood sugar pills (oral hypoglycemic), then do not take them on the morning of your procedure.  You may take them after you have had the procedure.  Do not take aspirin or any aspirin-containing medications, at least eleven (11) days prior to the procedure.  They may prolong bleeding.  Wear loose fitting clothing that may be easy to take off and that you would not mind if it got stained with Betadine or blood.  Do not wear any jewelry or perfume  Remove any nail coloring.  It will interfere with some of our monitoring equipment.  NOTE: Remember that this is not meant to be interpreted as a complete list of all possible complications.  Unforeseen problems may occur.  BLOOD THINNERS The following drugs contain aspirin or other  products, which can cause increased bleeding during surgery and should not be taken for 2 weeks prior to and 1 week after surgery.  If you should need take something for relief of minor pain, you may take acetaminophen which is found in Tylenol,m Datril, Anacin-3 and Panadol. It is not blood thinner. The products listed below   are.  Do not take any of the products listed below in addition to any listed on your instruction sheet.  A.P.C or A.P.C with Codeine Codeine Phosphate Capsules #3 Ibuprofen Ridaura  ABC compound Congesprin Imuran rimadil  Advil Cope Indocin Robaxisal  Alka-Seltzer Effervescent Pain Reliever and Antacid Coricidin or Coricidin-D  Indomethacin Rufen  Alka-Seltzer plus Cold Medicine Cosprin Ketoprofen S-A-C Tablets  Anacin Analgesic Tablets or Capsules Coumadin Korlgesic Salflex  Anacin Extra Strength Analgesic tablets or capsules CP-2 Tablets Lanoril Salicylate  Anaprox Cuprimine Capsules Levenox Salocol  Anexsia-D Dalteparin Magan Salsalate  Anodynos Darvon compound Magnesium Salicylate Sine-off  Ansaid Dasin Capsules Magsal Sodium Salicylate  Anturane Depen Capsules Marnal Soma  APF Arthritis pain formula Dewitt's Pills Measurin Stanback  Argesic Dia-Gesic Meclofenamic Sulfinpyrazone  Arthritis Bayer Timed Release Aspirin Diclofenac Meclomen Sulindac  Arthritis pain formula Anacin Dicumarol Medipren Supac  Analgesic (Safety coated) Arthralgen Diffunasal Mefanamic Suprofen  Arthritis Strength Bufferin Dihydrocodeine Mepro Compound Suprol  Arthropan liquid Dopirydamole Methcarbomol with Aspirin Synalgos  ASA tablets/Enseals Disalcid Micrainin Tagament  Ascriptin Doan's Midol Talwin  Ascriptin A/D Dolene Mobidin Tanderil  Ascriptin Extra Strength Dolobid Moblgesic Ticlid  Ascriptin with Codeine Doloprin or Doloprin with Codeine Momentum Tolectin  Asperbuf Duoprin Mono-gesic Trendar  Aspergum Duradyne Motrin or Motrin IB Triminicin  Aspirin plain, buffered or enteric  coated Durasal Myochrisine Trigesic  Aspirin Suppositories Easprin Nalfon Trillsate  Aspirin with Codeine Ecotrin Regular or Extra Strength Naprosyn Uracel  Atromid-S Efficin Naproxen Ursinus  Auranofin Capsules Elmiron Neocylate Vanquish  Axotal Emagrin Norgesic Verin  Azathioprine Empirin or Empirin with Codeine Normiflo Vitamin E  Azolid Emprazil Nuprin Voltaren  Bayer Aspirin plain, buffered or children's or timed BC Tablets or powders Encaprin Orgaran Warfarin Sodium  Buff-a-Comp Enoxaparin Orudis Zorpin  Buff-a-Comp with Codeine Equegesic Os-Cal-Gesic   Buffaprin Excedrin plain, buffered or Extra Strength Oxalid   Bufferin Arthritis Strength Feldene Oxphenbutazone   Bufferin plain or Extra Strength Feldene Capsules Oxycodone with Aspirin   Bufferin with Codeine Fenoprofen Fenoprofen Pabalate or Pabalate-SF   Buffets II Flogesic Panagesic   Buffinol plain or Extra Strength Florinal or Florinal with Codeine Panwarfarin   Buf-Tabs Flurbiprofen Penicillamine   Butalbital Compound Four-way cold tablets Penicillin   Butazolidin Fragmin Pepto-Bismol   Carbenicillin Geminisyn Percodan   Carna Arthritis Reliever Geopen Persantine   Carprofen Gold's salt Persistin   Chloramphenicol Goody's Phenylbutazone   Chloromycetin Haltrain Piroxlcam   Clmetidine heparin Plaquenil   Cllnoril Hyco-pap Ponstel   Clofibrate Hydroxy chloroquine Propoxyphen         Before stopping any of these medications, be sure to consult the physician who ordered them.  Some, such as Coumadin (Warfarin) are ordered to prevent or treat serious conditions such as "deep thrombosis", "pumonary embolisms", and other heart problems.  The amount of time that you may need off of the medication may also vary with the medication and the reason for which you were taking it.  If you are taking any of these medications, please make sure you notify your pain physician before you undergo any procedures.          

## 2015-09-02 NOTE — Progress Notes (Signed)
Chief complaint is low back pain and flank pain  Procedure: none  History of present illness: Megan Brock presents today for reevaluation. She has been doing reasonably well since her last visit. Quality characteristic distribution of pain have remained stable. She is taking her medications as prescribed and these continue to work reasonably well. She did follow-up with Dr. cope and has evidence that the renal cysts are enlarging and they are causing some compression upon the lumbar vertebral complex. She is also occasionally getting some radiating lower extremity pain recently. No lower extremity weakness or bowel bladder changes are noted.    Physical exam pupils are equally round and reactive to light  Extraocular muscles are intact   Heart is regular rate and rhythm and lower extremity strength and function remains a baseline with no significant changes noted. She still has baseline flank muscle tenderness.  Assessment #1 chronic low back pain and flank pain #2 chronic opioid management #3 Polycystic Kidney Disease  Plan: We'll refill medications at present with return to clinic in the next 2 months for reevaluation. Patient is to continue with physical therapy exercises and aerobic conditioning as tolerated and continue follow-up with her primary care physician furthermore she was unable to follow-up with [name] because of insurance reasons. We will continue her on her baseline opioid medications today.   Dr. Gwenyth BenderJames Jayleena Stille 4:49 PM

## 2015-09-09 LAB — TOXASSURE SELECT 13 (MW), URINE

## 2015-10-23 ENCOUNTER — Telehealth: Payer: Self-pay | Admitting: Anesthesiology

## 2015-10-23 NOTE — Telephone Encounter (Signed)
Patient needs epid and meds refill appt, none available, please ask Dr. Pernell DupreAdams when patient can be added

## 2015-10-28 ENCOUNTER — Telehealth: Payer: Self-pay | Admitting: *Deleted

## 2015-10-28 NOTE — Telephone Encounter (Signed)
Patient scheduled for 11/10/2015. Notified and confirmed.

## 2015-11-04 DIAGNOSIS — F339 Major depressive disorder, recurrent, unspecified: Secondary | ICD-10-CM | POA: Diagnosis not present

## 2015-11-10 ENCOUNTER — Ambulatory Visit: Payer: Medicare Other | Attending: Anesthesiology | Admitting: Anesthesiology

## 2015-11-10 ENCOUNTER — Encounter: Payer: Self-pay | Admitting: Anesthesiology

## 2015-11-10 VITALS — BP 110/72 | HR 76 | Temp 98.7°F | Resp 16 | Ht 64.0 in | Wt 160.0 lb

## 2015-11-10 DIAGNOSIS — M5431 Sciatica, right side: Secondary | ICD-10-CM

## 2015-11-10 DIAGNOSIS — M5136 Other intervertebral disc degeneration, lumbar region: Secondary | ICD-10-CM

## 2015-11-10 DIAGNOSIS — Q613 Polycystic kidney, unspecified: Secondary | ICD-10-CM | POA: Insufficient documentation

## 2015-11-10 DIAGNOSIS — G8929 Other chronic pain: Secondary | ICD-10-CM | POA: Diagnosis not present

## 2015-11-10 DIAGNOSIS — M5116 Intervertebral disc disorders with radiculopathy, lumbar region: Secondary | ICD-10-CM | POA: Diagnosis not present

## 2015-11-10 DIAGNOSIS — M545 Low back pain: Secondary | ICD-10-CM | POA: Diagnosis not present

## 2015-11-10 DIAGNOSIS — R202 Paresthesia of skin: Secondary | ICD-10-CM | POA: Insufficient documentation

## 2015-11-10 DIAGNOSIS — Z79891 Long term (current) use of opiate analgesic: Secondary | ICD-10-CM | POA: Insufficient documentation

## 2015-11-10 DIAGNOSIS — R109 Unspecified abdominal pain: Secondary | ICD-10-CM | POA: Insufficient documentation

## 2015-11-10 DIAGNOSIS — M791 Myalgia: Secondary | ICD-10-CM | POA: Diagnosis not present

## 2015-11-10 MED ORDER — MORPHINE SULFATE ER 60 MG PO TBCR: 60.0000 mg | EXTENDED_RELEASE_TABLET | Freq: Three times a day (TID) | ORAL | 0 refills | Status: DC

## 2015-11-10 MED ORDER — IOPAMIDOL (ISOVUE-M 200) INJECTION 41%
INTRAMUSCULAR | Status: AC
  Administered 2015-11-10: 14:00:00
  Filled 2015-11-10: qty 10

## 2015-11-10 MED ORDER — ROPIVACAINE HCL 2 MG/ML IJ SOLN
INTRAMUSCULAR | Status: AC
  Administered 2015-11-10: 14:00:00
  Filled 2015-11-10: qty 10

## 2015-11-10 MED ORDER — LIDOCAINE HCL (PF) 1 % IJ SOLN
INTRAMUSCULAR | Status: AC
  Administered 2015-11-10: 14:00:00
  Filled 2015-11-10: qty 5

## 2015-11-10 MED ORDER — HYDROCODONE-ACETAMINOPHEN 10-325 MG PO TABS: 1.0000 | ORAL_TABLET | Freq: Four times a day (QID) | ORAL | 0 refills | Status: DC | PRN

## 2015-11-10 MED ORDER — TRIAMCINOLONE ACETONIDE 40 MG/ML IJ SUSP
INTRAMUSCULAR | Status: AC
  Administered 2015-11-10: 14:00:00
  Filled 2015-11-10: qty 1

## 2015-11-10 MED ORDER — SODIUM CHLORIDE 0.9 % IJ SOLN
INTRAMUSCULAR | Status: AC
  Administered 2015-11-10: 14:00:00
  Filled 2015-11-10: qty 20

## 2015-11-10 NOTE — Patient Instructions (Signed)

## 2015-11-10 NOTE — Progress Notes (Signed)
Chief complaint is low back pain and flank pain  Procedure: L5-S1 epidural steroid injection under Fluoroscopic guidance with no sedation  History of present illness: Megan Brock presents for reevaluation. She has had an issue with some severe right lower back pain and radiation down her leg into the right calf that is been severe. She has a history of degenerative disc disease and this is generally been quiescent and also has a long-standing history of chronic low back pain with bilateral flank pain associated with her polycystic kidney disease. The pain going down the buttock is associated with some numbness and tingling radiating into the right foot with calf cramping that's worse with prolonged standing. No problems with bowel or bladder function have been noted and she does not report any motor weakness. She has been taking her medications as prescribed.   Physical exam pupils are equally round and reactive to light  Extraocular muscles are intact   Heart is regular rate and rhythm and lower extremity strength and function remains a baseline with no significant changes noted. She still has baseline flank muscle tenderness.  In the supine position she does have a positive straight leg raise on the right side. Negative on the left.  Assessment #1 L5 S1 radiculitis right side with lumbar degenerative disc disease  #2 chronic low back pain and flank pain # 3 chronic opioid management # 4 Polycystic Kidney Disease  Plan: We will plan on a lumbar epidural steroid injection today as discussed and reviewed with the patient. The risks and benefits have been reviewed with her in detail. We'll have her return to clinic in 2 months for reevaluation and med refill at that time. Should she have any acute occurrence of this same pain or worsening she is instructed to contact us for possible early return for an epidural repeat procedure. We'll refill medications at present with return to clinic in the next  2 months for reevaluation. Patient is to continue with physical therapy exercises and aerobic conditioning as tolerated and continue follow-up with her primary care physician furthermore she was unable to follow-up with Dr. Laban Emperor because of insurance reasons. We will continue her on her baseline opioid medications today.   Procedure: L5-S1 LESI with fluoroscopic guidance and moderate sedation  NOTE: The risks, benefits, and expectations of the procedure have been discussed and explained to the patient who was understanding and in agreement with suggested treatment plan. No guarantees were made.  DESCRIPTION OF PROCEDURE: Lumbar epidural steroid injection with IV Versed, EKG, blood pressure, pulse, and pulse oximetry monitoring. The procedure was performed with the patient in the prone position under fluoroscopic guidance. A local anesthetic skin wheal of 1.5% plain lidocaine was performed at the appropriate site after fluoroscopic identifictation  Using strict aseptic technique, I then advanced an 18-gauge Tuohy epidural needle in the midline via loss-of-resistance to saline technique. There was negative aspiration for heme or  CSF.  I then confirmed position with both AP and Lateral fluoroscan. At L5-S1  A total of 5 mL of Preservative-Free normal saline mixed with 40 mg of Kenalog and 1cc Ropicaine 0.2 percent was injected incrementally via the  epidurally placed needle. The needle was removed. The patient tolerated the injection well and was convalesced and discharged to home in stable condition. Should the patient have any post procedure difficulty they have been instructed on how to contact us for assistance.   Dr. Gwenyth Bender 3:58 PM

## 2015-11-19 ENCOUNTER — Ambulatory Visit: Payer: Self-pay | Admitting: Family Medicine

## 2015-11-25 ENCOUNTER — Ambulatory Visit (INDEPENDENT_AMBULATORY_CARE_PROVIDER_SITE_OTHER): Payer: Medicare Other | Admitting: Family Medicine

## 2015-11-25 ENCOUNTER — Encounter: Payer: Self-pay | Admitting: Family Medicine

## 2015-11-25 VITALS — BP 110/70 | HR 96 | Temp 98.8°F | Resp 16 | Ht 64.0 in | Wt 164.0 lb

## 2015-11-25 DIAGNOSIS — Q613 Polycystic kidney, unspecified: Secondary | ICD-10-CM | POA: Diagnosis not present

## 2015-11-25 NOTE — Progress Notes (Signed)
       Patient: Megan Brock Female    DOB: 03-Jul-1980   35 y.o.   MRN: [id number] Visit Date: 11/25/2015  Today's Provider: Mila Merryonald Adit Riddles, MD   Chief Complaint  Patient presents with  . Follow-up  . Hypertension   Subjective:    HPI    Hypertension, follow-up:  BP Readings from Last 3 Encounters:  11/25/15 110/70  11/10/15 (P) 129/74  09/01/15 (!) 160/94    She was last seen for hypertension 3 months ago.  BP at that visit was 160/94. Management since that visit includes; increased lisinopril to 10 mg qd.She reports good compliance with treatment. She is not having side effects.  She is exercising. She is adherent to low salt diet.   Outside blood pressures are variable from 110s to 150s.May be affected by chronic pain medications.  Has follow up with urology next month. . .  ----------------------------------------------------------------    No Known Allergies Current Meds  Medication Sig  . HYDROcodone-acetaminophen (NORCO) 10-325 MG tablet Take 1 tablet by mouth every 6 (six) hours as needed. Do not fill until [id number]  . lisinopril (PRINIVIL,ZESTRIL) 10 MG tablet Take 1 tablet (10 mg total) by mouth daily.  Marland Kitchen. morphine (MS CONTIN) 60 MG 12 hr tablet Take 1 tablet (60 mg total) by mouth 3 (three) times daily. Do not fill until [id number]    Review of Systems  Constitutional: Negative for appetite change, chills, fatigue and fever.  Respiratory: Negative for chest tightness and shortness of breath.   Cardiovascular: Negative for chest pain and palpitations.  Gastrointestinal: Negative for abdominal pain, nausea and vomiting.  Neurological: Negative for dizziness and weakness.    Social History  Substance Use Topics  . Smoking status: Current Every Day Smoker    Packs/day: 1.00    Types: Cigarettes  . Smokeless tobacco: Never Used  . Alcohol use No   Objective:   BP 110/70 (BP Location: Right Arm, Patient Position: Sitting, Cuff Size: Normal)   Pulse 96    Temp 98.8 F (37.1 C) (Oral)   Resp 16   Ht 5\' 4"  (1.626 m)   Wt 164 lb (74.4 kg)   LMP 10/27/2015   SpO2 99%   BMI 28.15 kg/m   Physical Exam  General appearance: alert, well developed, well nourished, cooperative and in no distress Head: Normocephalic, without obvious abnormality, atraumatic Respiratory: Respirations even and unlabored, normal respiratory rate Extremities: No gross deformities Skin: Skin color, texture, turgor normal. No rashes seen  Psych: Appropriate mood and affect. Neurologic: Mental status: Alert, oriented to person, place, and time, thought content appropriate.     Assessment & Plan:     1. Polycystic kidney disease Doing well since being on ACEI. Continue routine follow up with Dr. Achilles Dunkope who will bet checking renal functions. .Follow up annually if renal functions are stable.        Mila Merryonald Kamali Sakata, MD  [facility]

## 2015-12-22 ENCOUNTER — Other Ambulatory Visit: Payer: Self-pay | Admitting: Family Medicine

## 2015-12-26 ENCOUNTER — Ambulatory Visit: Payer: Medicare Other | Attending: Anesthesiology | Admitting: Anesthesiology

## 2015-12-26 ENCOUNTER — Encounter: Payer: Self-pay | Admitting: Anesthesiology

## 2015-12-26 VITALS — BP 138/89 | HR 65 | Temp 98.6°F | Resp 16 | Ht 64.0 in | Wt 160.0 lb

## 2015-12-26 DIAGNOSIS — Q613 Polycystic kidney, unspecified: Secondary | ICD-10-CM | POA: Diagnosis not present

## 2015-12-26 DIAGNOSIS — M545 Low back pain, unspecified: Secondary | ICD-10-CM

## 2015-12-26 DIAGNOSIS — F1120 Opioid dependence, uncomplicated: Secondary | ICD-10-CM | POA: Diagnosis not present

## 2015-12-26 DIAGNOSIS — R200 Anesthesia of skin: Secondary | ICD-10-CM | POA: Diagnosis not present

## 2015-12-26 DIAGNOSIS — M5431 Sciatica, right side: Secondary | ICD-10-CM

## 2015-12-26 DIAGNOSIS — G8929 Other chronic pain: Secondary | ICD-10-CM | POA: Insufficient documentation

## 2015-12-26 DIAGNOSIS — M5116 Intervertebral disc disorders with radiculopathy, lumbar region: Secondary | ICD-10-CM | POA: Diagnosis not present

## 2015-12-26 DIAGNOSIS — R109 Unspecified abdominal pain: Secondary | ICD-10-CM | POA: Diagnosis present

## 2015-12-26 MED ORDER — SODIUM CHLORIDE 0.9% FLUSH: 10.0000 mL | Freq: Once | INTRAVENOUS | Status: DC

## 2015-12-26 MED ORDER — ROPIVACAINE HCL 2 MG/ML IJ SOLN
10.0000 mL | Freq: Once | INTRAMUSCULAR | Status: AC
  Administered 2015-12-26: 10 mL via EPIDURAL

## 2015-12-26 MED ORDER — SODIUM CHLORIDE 0.9 % IJ SOLN
INTRAMUSCULAR | Status: AC
  Administered 2015-12-26: 13:00:00
  Filled 2015-12-26: qty 10

## 2015-12-26 MED ORDER — TRIAMCINOLONE ACETONIDE 40 MG/ML IJ SUSP
40.0000 mg | Freq: Once | INTRAMUSCULAR | Status: AC
Start: 2015-12-26 — End: 2015-12-26
  Administered 2015-12-26: 40 mg

## 2015-12-26 MED ORDER — IOPAMIDOL (ISOVUE-M 200) INJECTION 41%
20.0000 mL | Freq: Once | INTRAMUSCULAR | Status: DC | PRN
  Administered 2016-01-19: 20 mL
  Filled 2015-12-26: qty 20

## 2015-12-26 MED ORDER — HYDROCODONE-ACETAMINOPHEN 10-325 MG PO TABS: 1.0000 | ORAL_TABLET | Freq: Four times a day (QID) | ORAL | 0 refills | Status: DC | PRN

## 2015-12-26 MED ORDER — LIDOCAINE HCL (PF) 1 % IJ SOLN: 5.0000 mL | Freq: Once | INTRAMUSCULAR | Status: DC

## 2015-12-26 MED ORDER — MIDAZOLAM HCL 2 MG/2ML IJ SOLN: 5.0000 mg | Freq: Once | INTRAMUSCULAR | Status: DC

## 2015-12-26 MED ORDER — LIDOCAINE HCL (PF) 1 % IJ SOLN
INTRAMUSCULAR | Status: AC
  Administered 2015-12-26: 13:00:00
  Filled 2015-12-26: qty 5

## 2015-12-26 MED ORDER — MORPHINE SULFATE ER 60 MG PO TBCR: 60.0000 mg | EXTENDED_RELEASE_TABLET | Freq: Three times a day (TID) | ORAL | 0 refills | Status: DC

## 2015-12-26 MED ORDER — ROPIVACAINE HCL 2 MG/ML IJ SOLN
INTRAMUSCULAR | Status: AC
  Filled 2015-12-26: qty 10

## 2015-12-26 MED ORDER — IOPAMIDOL (ISOVUE-M 200) INJECTION 41%
INTRAMUSCULAR | Status: AC
  Administered 2015-12-26: 13:00:00
  Filled 2015-12-26: qty 10

## 2015-12-26 MED ORDER — LACTATED RINGERS IV SOLN
1000.0000 mL | INTRAVENOUS | Status: DC
  Administered 2015-12-26: 1000 mL via INTRAVENOUS

## 2015-12-26 MED ORDER — TRIAMCINOLONE ACETONIDE 40 MG/ML IJ SUSP
INTRAMUSCULAR | Status: AC
  Filled 2015-12-26: qty 1

## 2015-12-26 NOTE — Progress Notes (Signed)
Chief complaint is low back pain and flank pain  Procedure: L 4 L5 epidural steroid injection #2 under Fluoroscopic guidance with no sedation  History of present illness: Megan Brock presents for reevaluation. She has had an issue with some severe right lower back pain and radiation down her leg into the right calf that is been severe. She has a history of degenerative disc disease and this is generally been quiescent and also has a long-standing history of chronic low back pain with bilateral flank pain associated with her polycystic kidney disease. At her last visit she had an L5-S1 epidural steroid injection without significant improvement in her leg pain. No other change in her symptom complex is noted and her strength and sensory function are at baseline. The pain going down the buttock is associated with some numbness and tingling radiating into the right foot with calf cramping that's worse with prolonged standing. No problems with bowel or bladder function have been noted and she does not report any motor weakness. She has been taking her medications as prescribed.  Previous procedure: L5-S1 epidural steroid injection in August 2017   Physical exam pupils are equally round and reactive to light  Extraocular muscles are intact   Heart is regular rate and rhythm and lower extremity strength and function remains a baseline with no significant changes noted. She still has baseline flank muscle tenderness.  In the supine position she does have a positive straight leg raise on the right side. Negative on the left.  Assessment #1 L5 S1 radiculitis right side with lumbar degenerative disc disease  #2 chronic low back pain and flank pain # 3 chronic opioid management # 4 Polycystic Kidney Disease  Plan: We will plan on a lumbar epidural steroid injection #2 today at the L4 L5 interspace as discussed and reviewed with the patient. The risks and benefits have been reviewed with her in detail.  We'll have her return to clinic in 2 months for reevaluation and med refill at that time. Should she have any acute occurrence of this same pain or worsening she is instructed to contact us for possible early return for an epidural repeat procedure. We'll refill medications at present with return to clinic in the next 2 months for reevaluation. Patient is to continue with physical therapy exercises and aerobic conditioning as tolerated and continue follow-up with her primary care physician furthermore she was unable to follow-up with Megan Brock because of insurance reasons. We will continue her on her baseline opioid medications today.   Procedure: L 4 L5 LESI with fluoroscopic guidance and moderate sedation  NOTE: The risks, benefits, and expectations of the procedure have been discussed and explained to the patient who was understanding and in agreement with suggested treatment plan. No guarantees were made.  DESCRIPTION OF PROCEDURE: Lumbar epidural steroid injection with IV Versed, EKG, blood pressure, pulse, and pulse oximetry monitoring. The procedure was performed with the patient in the prone position under fluoroscopic guidance. A local anesthetic skin wheal of 1.5% plain lidocaine was performed at the appropriate site after fluoroscopic identifictation  Using strict aseptic technique, I then advanced an 18-gauge Tuohy epidural needle in the midline via loss-of-resistance to saline technique. There was negative aspiration for heme or  CSF.  I then confirmed position with both AP and Lateral fluoroscan. At L 4 L5  A total of 5 mL of Preservative-Free normal saline mixed with 40 mg of Kenalog and 1cc Ropicaine 0.2 percent was injected incrementally via the  epidurally placed needle. The needle was removed. The patient tolerated the injection well and was convalesced and discharged to home in stable condition. Should the patient have any post procedure difficulty they have been instructed on how to  contact us for assistance.   Dr. Gwenyth Brock 12:28 PM

## 2015-12-26 NOTE — Progress Notes (Signed)
Safety precautions to be maintained throughout the outpatient stay will include: orient to surroundings, keep bed in low position, maintain call bell within reach at all times, provide assistance with transfer out of bed and ambulation.  

## 2015-12-26 NOTE — Patient Instructions (Signed)

## 2016-01-14 DIAGNOSIS — E039 Hypothyroidism, unspecified: Secondary | ICD-10-CM | POA: Diagnosis not present

## 2016-01-14 DIAGNOSIS — N200 Calculus of kidney: Secondary | ICD-10-CM | POA: Diagnosis not present

## 2016-01-14 DIAGNOSIS — Q612 Polycystic kidney, adult type: Secondary | ICD-10-CM | POA: Diagnosis not present

## 2016-01-14 DIAGNOSIS — N23 Unspecified renal colic: Secondary | ICD-10-CM | POA: Diagnosis not present

## 2016-01-14 LAB — TSH: TSH: 1.6 (ref <=5.90)

## 2016-01-19 ENCOUNTER — Encounter: Payer: Self-pay | Admitting: Anesthesiology

## 2016-01-19 ENCOUNTER — Ambulatory Visit: Payer: Medicare Other | Attending: Anesthesiology | Admitting: Anesthesiology

## 2016-01-19 VITALS — BP 110/77 | HR 77 | Temp 98.7°F | Resp 16 | Ht 64.0 in | Wt 160.0 lb

## 2016-01-19 DIAGNOSIS — M5116 Intervertebral disc disorders with radiculopathy, lumbar region: Secondary | ICD-10-CM | POA: Diagnosis not present

## 2016-01-19 DIAGNOSIS — F1120 Opioid dependence, uncomplicated: Secondary | ICD-10-CM | POA: Diagnosis not present

## 2016-01-19 DIAGNOSIS — G894 Chronic pain syndrome: Secondary | ICD-10-CM

## 2016-01-19 DIAGNOSIS — R109 Unspecified abdominal pain: Secondary | ICD-10-CM | POA: Insufficient documentation

## 2016-01-19 DIAGNOSIS — M5136 Other intervertebral disc degeneration, lumbar region: Secondary | ICD-10-CM

## 2016-01-19 DIAGNOSIS — M539 Dorsopathy, unspecified: Secondary | ICD-10-CM | POA: Diagnosis not present

## 2016-01-19 DIAGNOSIS — Q613 Polycystic kidney, unspecified: Secondary | ICD-10-CM | POA: Insufficient documentation

## 2016-01-19 DIAGNOSIS — M5441 Lumbago with sciatica, right side: Secondary | ICD-10-CM

## 2016-01-19 DIAGNOSIS — F1190 Opioid use, unspecified, uncomplicated: Secondary | ICD-10-CM

## 2016-01-19 DIAGNOSIS — M545 Low back pain: Secondary | ICD-10-CM | POA: Insufficient documentation

## 2016-01-19 DIAGNOSIS — G8929 Other chronic pain: Secondary | ICD-10-CM | POA: Insufficient documentation

## 2016-01-19 DIAGNOSIS — M5386 Other specified dorsopathies, lumbar region: Secondary | ICD-10-CM

## 2016-01-19 MED ORDER — TRIAMCINOLONE ACETONIDE 40 MG/ML IJ SUSP
40.0000 mg | Freq: Once | INTRAMUSCULAR | Status: AC
  Administered 2016-01-19: 40 mg
  Filled 2016-01-19: qty 1

## 2016-01-19 MED ORDER — MORPHINE SULFATE ER 60 MG PO TBCR: 60.0000 mg | EXTENDED_RELEASE_TABLET | Freq: Three times a day (TID) | ORAL | 0 refills | Status: DC

## 2016-01-19 MED ORDER — SODIUM CHLORIDE 0.9% FLUSH
10.0000 mL | Freq: Once | INTRAVENOUS | Status: AC
Start: 2016-01-19 — End: 2016-01-19
  Administered 2016-01-19: 10 mL

## 2016-01-19 MED ORDER — ROPIVACAINE HCL 2 MG/ML IJ SOLN
10.0000 mL | Freq: Once | INTRAMUSCULAR | Status: AC
  Administered 2016-01-19: 10 mL via EPIDURAL
  Filled 2016-01-19: qty 10

## 2016-01-19 MED ORDER — HYDROCODONE-ACETAMINOPHEN 10-325 MG PO TABS: 1.0000 | ORAL_TABLET | Freq: Four times a day (QID) | ORAL | 0 refills | Status: DC | PRN

## 2016-01-19 MED ORDER — LIDOCAINE HCL (PF) 1 % IJ SOLN
5.0000 mL | Freq: Once | INTRAMUSCULAR | Status: AC
  Administered 2016-01-19: 5 mL via SUBCUTANEOUS
  Filled 2016-01-19: qty 5

## 2016-01-19 MED ORDER — IOPAMIDOL (ISOVUE-M 200) INJECTION 41%
20.0000 mL | Freq: Once | INTRAMUSCULAR | Status: DC | PRN
  Administered 2016-01-19: 20 mL
  Filled 2016-01-19: qty 20

## 2016-01-19 NOTE — Patient Instructions (Addendum)
Pain Management Discharge Instructions  General Discharge Instructions :  If you need to reach your doctor call: Monday-Friday 8:00 am - 4:00 pm at [phone] or toll free [phone].  After clinic hours [phone] to have operator reach doctor.  Bring all of your medication bottles to all your appointments in the pain clinic.  To cancel or reschedule your appointment with Pain Management please remember to call 24 hours in advance to avoid a fee.  Refer to the educational materials which you have been given on: General Risks, I had my Procedure. Discharge Instructions, Post Sedation.  Post Procedure Instructions:  The drugs you were given will stay in your system until tomorrow, so for the next 24 hours you should not drive, make any legal decisions or drink any alcoholic beverages.  You may eat anything you prefer, but it is better to start with liquids then soups and crackers, and gradually work up to solid foods.  Please notify your doctor immediately if you have any unusual bleeding, trouble breathing or pain that is not related to your normal pain.  Depending on the type of procedure that was done, some parts of your body may feel week and/or numb.  This usually clears up by tonight or the next day.  Walk with the use of an assistive device or accompanied by an adult for the 24 hours.  You may use ice on the affected area for the first 24 hours.  Put ice in a Ziploc bag and cover with a towel and place against area 15 minutes on 15 minutes off.  You may switch to heat after 24 hours.Epidural Steroid Injection Patient Information  Description: The epidural space surrounds the nerves as they exit the spinal cord.  In some patients, the nerves can be compressed and inflamed by a bulging disc or a tight spinal canal (spinal stenosis).  By injecting steroids into the epidural space, we can bring irritated nerves into direct contact with a potentially helpful medication.  These  steroids act directly on the irritated nerves and can reduce swelling and inflammation which often leads to decreased pain.  Epidural steroids may be injected anywhere along the spine and from the neck to the low back depending upon the location of your pain.   After numbing the skin with local anesthetic (like Novocaine), a small needle is passed into the epidural space slowly.  You may experience a sensation of pressure while this is being done.  The entire block usually last less than 10 minutes.  Conditions which may be treated by epidural steroids:   Low back and leg pain  Neck and arm pain  Spinal stenosis  Post-laminectomy syndrome  Herpes zoster (shingles) pain  Pain from compression fractures  Preparation for the injection:  1. Do not eat any solid food or dairy products within 8 hours of your appointment.  2. You may drink clear liquids up to 3 hours before appointment.  Clear liquids include water, black coffee, juice or soda.  No milk or cream please. 3. You may take your regular medication, including pain medications, with a sip of water before your appointment  Diabetics should hold regular insulin (if taken separately) and take 1/2 normal NPH dos the morning of the procedure.  Carry some sugar containing items with you to your appointment. 4. A driver must accompany you and be prepared to drive you home after your procedure.  5. Bring all your current medications with your. 6. An IV may be inserted and   sedation may be given at the discretion of the physician.   7. A blood pressure cuff, EKG and other monitors will often be applied during the procedure.  Some patients may need to have extra oxygen administered for a short period. 8. You will be asked to provide medical information, including your allergies, prior to the procedure.  We must know immediately if you are taking blood thinners (like Coumadin/Warfarin)  Or if you are allergic to IV iodine contrast (dye). We must  know if you could possible be pregnant.  Possible side-effects:  Bleeding from needle site  Infection (rare, may require surgery)  Nerve injury (rare)  Numbness & tingling (temporary)  Difficulty urinating (rare, temporary)  Spinal headache ( a headache worse with upright posture)  Light -headedness (temporary)  Pain at injection site (several days)  Decreased blood pressure (temporary)  Weakness in arm/leg (temporary)  Pressure sensation in back/neck (temporary)  Call if you experience:  Fever/chills associated with headache or increased back/neck pain.  Headache worsened by an upright position.  New onset weakness or numbness of an extremity below the injection site  Hives or difficulty breathing (go to the emergency room)  Inflammation or drainage at the infection site  Severe back/neck pain  Any new symptoms which are concerning to you  Please note:  Although the local anesthetic injected can often make your back or neck feel good for several hours after the injection, the pain will likely return.  It takes 3-7 days for steroids to work in the epidural space.  You may not notice any pain relief for at least that one week.  If effective, we will often do a series of three injections spaced 3-6 weeks apart to maximally decrease your pain.  After the initial series, we generally will wait several months before considering a repeat injection of the same type.  If you have any questions, please call [phone] [facility]  General Discharge Instructions :  If you need to reach your doctor call: Monday-Friday 8:00 am - 4:00 pm at [phone] or toll free [phone].  After clinic hours [phone] to have operator reach doctor.  Bring all of your medication bottles to all your appointments in the pain clinic.  To cancel or reschedule your appointment with Pain Management please  remember to call 24 hours in advance to avoid a fee.  Refer to the educational materials which you have been given on: General Risks, I had my Procedure. Discharge Instructions, Post Sedation.  Post Procedure Instructions:  The drugs you were given will stay in your system until tomorrow, so for the next 24 hours you should not drive, make any legal decisions or drink any alcoholic beverages.  You may eat anything you prefer, but it is better to start with liquids then soups and crackers, and gradually work up to solid foods.  Please notify your doctor immediately if you have any unusual bleeding, trouble breathing or pain that is not related to your normal pain.  Depending on the type of procedure that was done, some parts of your body may feel week and/or numb.  This usually clears up by tonight or the next day.  Walk with the use of an assistive device or accompanied by an adult for the 24 hours.  You may use ice on the affected area for the first 24 hours.  Put ice in a Ziploc bag and cover with a towel and place against area 15  minutes on 15 minutes off.  You may switch to heat after 24 hours.Lumbar Sympathetic Block Patient Information  Description: The lumbar plexus is a group of nerves that are part of the sympathetic nervous system.  These nerves supply organs in the pelvis and legs.  Lumbar sympathetic blocks are utilized for the diagnosis and treatment of painful conditions in these areas.   The lumbar plexus is located on both sides of the aorta at approximately the level of the second lumbar vertebral body.  The block will be performed with you lying on your abdomen with a pillow underneath.  Using direct x-ray guidance,   The plexus will be located on both sides of the spine.  Numbing medicine will be used to deaden the skin prior to needle insertion.  In most cases, a small amount of sedation can be give by IV prior to the numbing medicine.  One or two small needles will be placed  near the plexus and local anesthetic will be injected.  This may make your leg(s) feel warm.  The Entire block usually lasts about 15-25 minutes.  Conditions which may be treated by lumbar sympathetic block:   Reflex sympathetic dystrophy  Phantom limb pain  Peripheral neuropathy  Peripheral vascular disease ( inadequate blood flow )  Cancer pain of pelvis, leg and kidney  Preparation for the injection:  12. Do note eat any solid food or diary products within 8 hours of your appointment. 13. You may drink clear liquids up to 3 hours before appointment.  Clear liquids include water, black coffee, juice or soda.  No milk or cream please. 14. You may take your regular medication, including pain medications, with a sip of water before you appointment.  Diabetics should hold regular insulin ( if taken separately ) and take 1/2 NPH dose the morning of the procedure .  Carry some sugar containing items with you to your appointment. 15. A driver must accompany you and be prepared to drive you home after your procedure. 16. Bring all your current medication with you. 17. An IV may be inserted and sedation may be given at the discretion of the physician.  18. A blood pressure cuff, EKG and other monitors will often be applied during the procedure.  Some patients may need to have extra oxygen administered for a short period. 19. You will be asked to provide medical information, including your allergies and medications, prior to the procedure.  We must know immediately if your taking blood thinners (like Coumadin/Warfarin) or if you are allergic to IV iodine contrast (dye).  We must know if you could possibly be pregnant.  Possible side-effects   Bleeding from needle site or deeper  Infection (rare, can require surgery)  Nerve injury (rare)  Numbness & tingling (temporary)  Collapsed lung (rare)  Spinal headache (a headache worse with upright posture)  Light-headedness  (temporary)  Pain at injection site (several days)  Decreased blood pressure (temporary)  Weakness in legs (temporary)  Seizure or other drug reaction (rare)  Call if you experience:   Fever/chills associated with headache or increased back/ neck pain  Headache worsened by an upright position  New onset weakness or numbness of an extremity below the injection site  Hives or difficulty breathing ( go to the emergency room)  Inflammation or drainage at the injections site(s)  New symptoms which are concerning to you  Please note:  If effective, we will often do a series of 2-3 injections spaced 3-6 weeks apart  to maximally decrease your pain.  If initial series is effective, you may be a candidate for a more permanent block of the lumbar sympathetic plexus.  If you have any questions please call [phone] [facility] GENERAL RISKS AND COMPLICATIONS  What are the risk, side effects and possible complications? Generally speaking, most procedures are safe.  However, with any procedure there are risks, side effects, and the possibility of complications.  The risks and complications are dependent upon the sites that are lesioned, or the type of nerve block to be performed.  The closer the procedure is to the spine, the more serious the risks are.  Great care is taken when placing the radio frequency needles, block needles or lesioning probes, but sometimes complications can occur. 1. Infection: Any time there is an injection through the skin, there is a risk of infection.  This is why sterile conditions are used for these blocks.  There are four possible types of infection. 1. Localized skin infection. 2. Central Nervous System Infection-This can be in the form of Meningitis, which can be deadly. 3. Epidural Infections-This can be in the form of an epidural abscess, which can cause pressure inside of the spine, causing compression of the spinal cord  with subsequent paralysis. This would require an emergency surgery to decompress, and there are no guarantees that the patient would recover from the paralysis. 4. Discitis-This is an infection of the intervertebral discs.  It occurs in about 1% of discography procedures.  It is difficult to treat and it may lead to surgery.        2. Pain: the needles have to go through skin and soft tissues, will cause soreness.       3. Damage to internal structures:  The nerves to be lesioned may be near blood vessels or    other nerves which can be potentially damaged.       4. Bleeding: Bleeding is more common if the patient is taking blood thinners such as  aspirin, Coumadin, Ticiid, Plavix, etc., or if he/she have some genetic predisposition  such as hemophilia. Bleeding into the spinal canal can cause compression of the spinal  cord with subsequent paralysis.  This would require an emergency surgery to  decompress and there are no guarantees that the patient would recover from the  paralysis.       5. Pneumothorax:  Puncturing of a lung is a possibility, every time a needle is introduced in  the area of the chest or upper back.  Pneumothorax refers to free air around the  collapsed lung(s), inside of the thoracic cavity (chest cavity).  Another two possible  complications related to a similar event would include: Hemothorax and Chylothorax.   These are variations of the Pneumothorax, where instead of air around the collapsed  lung(s), you may have blood or chyle, respectively.       6. Spinal headaches: They may occur with any procedures in the area of the spine.       7. Persistent CSF (Cerebro-Spinal Fluid) leakage: This is a rare problem, but may occur  with prolonged intrathecal or epidural catheters either due to the formation of a fistulous  track or a dural tear.       8. Nerve damage: By working so close to the spinal cord, there is always a possibility of  nerve damage, which could be as serious as a  permanent spinal cord injury with  paralysis.  9. Death:  Although rare, severe deadly allergic reactions known as "Anaphylactic  reaction" can occur to any of the medications used.      10. Worsening of the symptoms:  We can always make thing worse.  What are the chances of something like this happening? Chances of any of this occuring are extremely low.  By statistics, you have more of a chance of getting killed in a motor vehicle accident: while driving to the hospital than any of the above occurring .  Nevertheless, you should be aware that they are possibilities.  In general, it is similar to taking a shower.  Everybody knows that you can slip, hit your head and get killed.  Does that mean that you should not shower again?  Nevertheless always keep in mind that statistics do not mean anything if you happen to be on the wrong side of them.  Even if a procedure has a 1 (one) in a 1,000,000 (million) chance of going wrong, it you happen to be that one..Also, keep in mind that by statistics, you have more of a chance of having something go wrong when taking medications.  Who should not have this procedure? If you are on a blood thinning medication (e.g. Coumadin, Plavix, see list of "Blood Thinners"), or if you have an active infection going on, you should not have the procedure.  If you are taking any blood thinners, please inform your physician.  How should I prepare for this procedure?  Do not eat or drink anything at least six hours prior to the procedure.  Bring a driver with you .  It cannot be a taxi.  Come accompanied by an adult that can drive you back, and that is strong enough to help you if your legs get weak or numb from the local anesthetic.  Take all of your medicines the morning of the procedure with just enough water to swallow them.  If you have diabetes, make sure that you are scheduled to have your procedure done first thing in the morning, whenever possible.  If you  have diabetes, take only half of your insulin dose and notify our nurse that you have done so as soon as you arrive at the clinic.  If you are diabetic, but only take blood sugar pills (oral hypoglycemic), then do not take them on the morning of your procedure.  You may take them after you have had the procedure.  Do not take aspirin or any aspirin-containing medications, at least eleven (11) days prior to the procedure.  They may prolong bleeding.  Wear loose fitting clothing that may be easy to take off and that you would not mind if it got stained with Betadine or blood.  Do not wear any jewelry or perfume  Remove any nail coloring.  It will interfere with some of our monitoring equipment.  NOTE: Remember that this is not meant to be interpreted as a complete list of all possible complications.  Unforeseen problems may occur.  BLOOD THINNERS The following drugs contain aspirin or other products, which can cause increased bleeding during surgery and should not be taken for 2 weeks prior to and 1 week after surgery.  If you should need take something for relief of minor pain, you may take acetaminophen which is found in Tylenol,m Datril, Anacin-3 and Panadol. It is not blood thinner. The products listed below are.  Do not take any of the products listed below in addition to any listed on your  instruction sheet.  A.P.C or A.P.C with Codeine Codeine Phosphate Capsules #3 Ibuprofen Ridaura  ABC compound Congesprin Imuran rimadil  Advil Cope Indocin Robaxisal  Alka-Seltzer Effervescent Pain Reliever and Antacid Coricidin or Coricidin-D  Indomethacin Rufen  Alka-Seltzer plus Cold Medicine Cosprin Ketoprofen S-A-C Tablets  Anacin Analgesic Tablets or Capsules Coumadin Korlgesic Salflex  Anacin Extra Strength Analgesic tablets or capsules CP-2 Tablets Lanoril Salicylate  Anaprox Cuprimine Capsules Levenox Salocol  Anexsia-D Dalteparin Magan Salsalate  Anodynos Darvon compound Magnesium  Salicylate Sine-off  Ansaid Dasin Capsules Magsal Sodium Salicylate  Anturane Depen Capsules Marnal Soma  APF Arthritis pain formula Dewitt's Pills Measurin Stanback  Argesic Dia-Gesic Meclofenamic Sulfinpyrazone  Arthritis Bayer Timed Release Aspirin Diclofenac Meclomen Sulindac  Arthritis pain formula Anacin Dicumarol Medipren Supac  Analgesic (Safety coated) Arthralgen Diffunasal Mefanamic Suprofen  Arthritis Strength Bufferin Dihydrocodeine Mepro Compound Suprol  Arthropan liquid Dopirydamole Methcarbomol with Aspirin Synalgos  ASA tablets/Enseals Disalcid Micrainin Tagament  Ascriptin Doan's Midol Talwin  Ascriptin A/D Dolene Mobidin Tanderil  Ascriptin Extra Strength Dolobid Moblgesic Ticlid  Ascriptin with Codeine Doloprin or Doloprin with Codeine Momentum Tolectin  Asperbuf Duoprin Mono-gesic Trendar  Aspergum Duradyne Motrin or Motrin IB Triminicin  Aspirin plain, buffered or enteric coated Durasal Myochrisine Trigesic  Aspirin Suppositories Easprin Nalfon Trillsate  Aspirin with Codeine Ecotrin Regular or Extra Strength Naprosyn Uracel  Atromid-S Efficin Naproxen Ursinus  Auranofin Capsules Elmiron Neocylate Vanquish  Axotal Emagrin Norgesic Verin  Azathioprine Empirin or Empirin with Codeine Normiflo Vitamin E  Azolid Emprazil Nuprin Voltaren  Bayer Aspirin plain, buffered or children's or timed BC Tablets or powders Encaprin Orgaran Warfarin Sodium  Buff-a-Comp Enoxaparin Orudis Zorpin  Buff-a-Comp with Codeine Equegesic Os-Cal-Gesic   Buffaprin Excedrin plain, buffered or Extra Strength Oxalid   Bufferin Arthritis Strength Feldene Oxphenbutazone   Bufferin plain or Extra Strength Feldene Capsules Oxycodone with Aspirin   Bufferin with Codeine Fenoprofen Fenoprofen Pabalate or Pabalate-SF   Buffets II Flogesic Panagesic   Buffinol plain or Extra Strength Florinal or Florinal with Codeine Panwarfarin   Buf-Tabs Flurbiprofen Penicillamine   Butalbital Compound Four-way  cold tablets Penicillin   Butazolidin Fragmin Pepto-Bismol   Carbenicillin Geminisyn Percodan   Carna Arthritis Reliever Geopen Persantine   Carprofen Gold's salt Persistin   Chloramphenicol Goody's Phenylbutazone   Chloromycetin Haltrain Piroxlcam   Clmetidine heparin Plaquenil   Cllnoril Hyco-pap Ponstel   Clofibrate Hydroxy chloroquine Propoxyphen         Before stopping any of these medications, be sure to consult the physician who ordered them.  Some, such as Coumadin (Warfarin) are ordered to prevent or treat serious conditions such as "deep thrombosis", "pumonary embolisms", and other heart problems.  The amount of time that you may need off of the medication may also vary with the medication and the reason for which you were taking it.  If you are taking any of these medications, please make sure you notify your pain physician before you undergo any procedures.

## 2016-01-20 NOTE — Progress Notes (Signed)
Chief complaint is low back pain and flank pain  Procedure: L 4 L5 epidural steroid injection #3 under Fluoroscopic guidance with no sedation  History of present illness: Megan Brock presents for reevaluation. She was last seen approximately 1 month ago and had her second epidural injection. She states that the right leg and posterior lateral leg pain is significantly better. The pain generally is worse on the right more so than left side. She is having some right anterior thigh pain but the right posterior leg pain is significantly improved. Otherwise she is in her usual state of health. She did follow-up with Dr. cope and is due for an ultrasound of her kidney seen to see if there are any cysts that need to be decompressed. She's taking her medications as prescribed without any diverting or illicit use or problems based on her narcotic assessment sheet. She desires to proceed with her third epidural injection today.   Previous procedure: L5-S1 2 epidural steroid injection in  2017   Physical exam pupils are equally round and reactive to light  Extraocular muscles are intact   Heart is regular rate and rhythm and lower extremity strength and function remains a baseline with no significant changes noted. She still has baseline flank muscle tenderness.  In the supine position she does have a positive straight leg raise on the right side. Negative on the left.  Assessment #1 L5 S1 radiculitis right side with lumbar degenerative disc disease that has improved but persistent right anterior thigh pain #2 chronic low back pain and flank pain # 3 chronic opioid management # 4 Polycystic Kidney Disease  Plan: We will plan on a lumbar epidural steroid injection #2 today at the L4 L5 interspace as discussed and reviewed with the patient. She reports that the relief with the second epidural was superior 10 the first which was done at L5-S1. The risks and benefits have been reviewed with her in detail.  We'll have her return to clinic in 2 months for reevaluation and med refill at that time.  Patient is to continue with physical therapy exercises and aerobic conditioning as tolerated and continue follow-up with her primary care physician furthermore she was unable to follow-up with [name] because of insurance reasons. We will continue her on her baseline opioid medications today.   Procedure: L 4 L5 LESI #3 with fluoroscopic guidance and moderate sedation  NOTE: The risks, benefits, and expectations of the procedure have been discussed and explained to the patient who was understanding and in agreement with suggested treatment plan. No guarantees were made.  DESCRIPTION OF PROCEDURE: Lumbar epidural steroid injection with IV Versed, EKG, blood pressure, pulse, and pulse oximetry monitoring. The procedure was performed with the patient in the prone position under fluoroscopic guidance. A local anesthetic skin wheal of 1.5% plain lidocaine was performed at the appropriate site after fluoroscopic identifictation  Using strict aseptic technique, I then advanced an 18-gauge Tuohy epidural needle in the midline via loss-of-resistance to saline technique. There was negative aspiration for heme or  CSF.  I then confirmed position with both AP and Lateral fluoroscan. At L 4 L5  A total of 5 mL of Preservative-Free normal saline mixed with 40 mg of Kenalog and 1cc Ropicaine 0.2 percent was injected incrementally via the  epidurally placed needle. The needle was removed. The patient tolerated the injection well and was convalesced and discharged to home in stable condition. Should the patient have any post procedure difficulty they have been  instructed on how to contact us for assistance.   Dr. Gwenyth Bender 5:28 PM

## 2016-02-03 DIAGNOSIS — F339 Major depressive disorder, recurrent, unspecified: Secondary | ICD-10-CM | POA: Diagnosis not present

## 2016-02-26 ENCOUNTER — Telehealth: Payer: Self-pay | Admitting: Family Medicine

## 2016-02-26 NOTE — Telephone Encounter (Signed)
Pt called wanting to know if she needs to get a pnumonia vaccine.  She said her My Chart acct says she does, but she has never had one before  Her call back is [phone]  Thanks Barth Kirkseri

## 2016-02-26 NOTE — Telephone Encounter (Signed)
Returned call to pt and left detailed message explaining that she does not need a pneumonia vaccine.

## 2016-02-27 DIAGNOSIS — Z23 Encounter for immunization: Secondary | ICD-10-CM | POA: Diagnosis not present

## 2016-04-14 ENCOUNTER — Ambulatory Visit: Payer: Medicare Other | Attending: Anesthesiology | Admitting: Anesthesiology

## 2016-04-14 ENCOUNTER — Encounter: Payer: Self-pay | Admitting: Anesthesiology

## 2016-04-14 VITALS — BP 130/78 | HR 115 | Temp 98.2°F | Resp 16 | Ht 64.0 in | Wt 160.0 lb

## 2016-04-14 DIAGNOSIS — Q613 Polycystic kidney, unspecified: Secondary | ICD-10-CM | POA: Insufficient documentation

## 2016-04-14 DIAGNOSIS — M5441 Lumbago with sciatica, right side: Secondary | ICD-10-CM | POA: Diagnosis not present

## 2016-04-14 DIAGNOSIS — G8929 Other chronic pain: Secondary | ICD-10-CM | POA: Diagnosis not present

## 2016-04-14 DIAGNOSIS — M5116 Intervertebral disc disorders with radiculopathy, lumbar region: Secondary | ICD-10-CM | POA: Diagnosis not present

## 2016-04-14 DIAGNOSIS — G894 Chronic pain syndrome: Secondary | ICD-10-CM

## 2016-04-14 DIAGNOSIS — M545 Low back pain: Secondary | ICD-10-CM | POA: Insufficient documentation

## 2016-04-14 DIAGNOSIS — M79669 Pain in unspecified lower leg: Secondary | ICD-10-CM | POA: Diagnosis not present

## 2016-04-14 DIAGNOSIS — M5137 Other intervertebral disc degeneration, lumbosacral region: Secondary | ICD-10-CM | POA: Insufficient documentation

## 2016-04-14 DIAGNOSIS — R109 Unspecified abdominal pain: Secondary | ICD-10-CM | POA: Diagnosis not present

## 2016-04-14 MED ORDER — MORPHINE SULFATE ER 60 MG PO TBCR: 60.0000 mg | EXTENDED_RELEASE_TABLET | Freq: Three times a day (TID) | ORAL | 0 refills | Status: DC

## 2016-04-14 MED ORDER — HYDROCODONE-ACETAMINOPHEN 10-325 MG PO TABS: 1.0000 | ORAL_TABLET | Freq: Four times a day (QID) | ORAL | 0 refills | Status: DC | PRN

## 2016-04-14 MED ORDER — CYCLOBENZAPRINE HCL 10 MG PO TABS: 10.0000 mg | ORAL_TABLET | Freq: Every day | ORAL | 3 refills | Status: DC

## 2016-04-14 NOTE — Progress Notes (Signed)
Safety precautions to be maintained throughout the outpatient stay will include: orient to surroundings, keep bed in low position, maintain call bell within reach at all times, provide assistance with transfer out of bed and ambulation.  

## 2016-04-15 ENCOUNTER — Other Ambulatory Visit: Payer: Self-pay | Admitting: Anesthesiology

## 2016-04-15 NOTE — Progress Notes (Signed)
Chief complaint is low back pain and flank pain  Procedure: None Previous Procedure: L 4 L5 epidural steroid injection #3 October, under Fluoroscopic guidance with no sedation  History of present illness: Megan Brock presents for reevaluation. She has been doing fairly well with her lower leg pain. She still having some back pain of the same nature. She is followed by Dr. cope in this regards. She has chronic polycystic kidney disease causing chronic low back pain. Her radicular type pain has improved following the series of epidural steroids. She is tolerating her medications well. Based on her narcotic assessment sheet she feels that the medications are doing and overall lifestyle and affect. No significant side effects are noted. Otherwise she is in her usual state of health at this time.   Previous procedure: L5-S1 2 epidural steroid injection in  2017   Physical exam pupils are equally round and reactive to light  Extraocular muscles are intact   Heart is regular rate and rhythm and lower extremity strength and function remains a baseline with no significant changes noted. She still has baseline flank muscle tenderness.  She has some mild paraspinous muscle tenderness but no overt trigger points. She is ambulating well   Assessment   #1 L5 S1 radiculitis right side with lumbar degenerative disc disease that has improved following epidural steroid intervention. #2 chronic low back pain and flank pain  # 3 chronic opioid management  # 4 Polycystic Kidney Disease with chronic opioid maintenance therapy  Plan:  At this point I think it's reasonable to defer on any repeat injections. I will refill her medications today for the next 2 months. She has been compliant with the regimen. We have reviewed the practitioner database and it is appropriate. She is to continue following up with her primary care physicians and Dr. cope.  Dr. Gwenyth BenderJames Adams 4:19 PM

## 2016-04-16 LAB — SPECIMEN STATUS REPORT

## 2016-04-20 ENCOUNTER — Other Ambulatory Visit: Payer: Self-pay | Admitting: *Deleted

## 2016-04-20 DIAGNOSIS — F1190 Opioid use, unspecified, uncomplicated: Secondary | ICD-10-CM

## 2016-04-24 LAB — TOXASSURE SELECT 13 (MW), URINE

## 2016-04-24 LAB — SPECIMEN STATUS REPORT

## 2016-05-04 DIAGNOSIS — F339 Major depressive disorder, recurrent, unspecified: Secondary | ICD-10-CM | POA: Diagnosis not present

## 2016-05-20 DIAGNOSIS — Q612 Polycystic kidney, adult type: Secondary | ICD-10-CM | POA: Diagnosis not present

## 2016-05-20 DIAGNOSIS — N23 Unspecified renal colic: Secondary | ICD-10-CM | POA: Diagnosis not present

## 2016-05-20 DIAGNOSIS — N200 Calculus of kidney: Secondary | ICD-10-CM | POA: Diagnosis not present

## 2016-05-21 DIAGNOSIS — N289 Disorder of kidney and ureter, unspecified: Secondary | ICD-10-CM | POA: Diagnosis not present

## 2016-05-21 DIAGNOSIS — N200 Calculus of kidney: Secondary | ICD-10-CM | POA: Diagnosis not present

## 2016-05-21 DIAGNOSIS — N281 Cyst of kidney, acquired: Secondary | ICD-10-CM | POA: Diagnosis not present

## 2016-05-21 DIAGNOSIS — N2889 Other specified disorders of kidney and ureter: Secondary | ICD-10-CM | POA: Diagnosis not present

## 2016-05-21 DIAGNOSIS — Q612 Polycystic kidney, adult type: Secondary | ICD-10-CM | POA: Diagnosis not present

## 2016-05-21 DIAGNOSIS — N2881 Hypertrophy of kidney: Secondary | ICD-10-CM | POA: Diagnosis not present

## 2016-06-02 ENCOUNTER — Ambulatory Visit: Payer: Medicare Other | Attending: Anesthesiology | Admitting: Anesthesiology

## 2016-06-02 ENCOUNTER — Encounter: Payer: Self-pay | Admitting: Anesthesiology

## 2016-06-02 VITALS — BP 140/85 | HR 108 | Temp 99.9°F | Resp 18 | Ht 64.0 in | Wt 156.0 lb

## 2016-06-02 DIAGNOSIS — Q613 Polycystic kidney, unspecified: Secondary | ICD-10-CM | POA: Diagnosis not present

## 2016-06-02 DIAGNOSIS — G8929 Other chronic pain: Secondary | ICD-10-CM | POA: Diagnosis not present

## 2016-06-02 DIAGNOSIS — M5117 Intervertebral disc disorders with radiculopathy, lumbosacral region: Secondary | ICD-10-CM | POA: Insufficient documentation

## 2016-06-02 DIAGNOSIS — M5136 Other intervertebral disc degeneration, lumbar region: Secondary | ICD-10-CM | POA: Diagnosis not present

## 2016-06-02 DIAGNOSIS — M5116 Intervertebral disc disorders with radiculopathy, lumbar region: Secondary | ICD-10-CM | POA: Insufficient documentation

## 2016-06-02 DIAGNOSIS — M539 Dorsopathy, unspecified: Secondary | ICD-10-CM

## 2016-06-02 DIAGNOSIS — R109 Unspecified abdominal pain: Secondary | ICD-10-CM | POA: Diagnosis not present

## 2016-06-02 DIAGNOSIS — G894 Chronic pain syndrome: Secondary | ICD-10-CM

## 2016-06-02 DIAGNOSIS — M5386 Other specified dorsopathies, lumbar region: Secondary | ICD-10-CM

## 2016-06-02 MED ORDER — HYDROCODONE-ACETAMINOPHEN 10-325 MG PO TABS: 1.0000 | ORAL_TABLET | Freq: Four times a day (QID) | ORAL | 0 refills | Status: DC | PRN

## 2016-06-02 MED ORDER — MORPHINE SULFATE ER 60 MG PO TBCR: 60.0000 mg | EXTENDED_RELEASE_TABLET | Freq: Three times a day (TID) | ORAL | 0 refills | Status: DC

## 2016-06-02 NOTE — Progress Notes (Signed)
Safety precautions to be maintained throughout the outpatient stay will include: orient to surroundings, keep bed in low position, maintain call bell within reach at all times, provide assistance with transfer out of bed and ambulation.  

## 2016-06-02 NOTE — Progress Notes (Signed)
Chief complaint is low back pain and flank pain  Procedure: None Previous Procedure: L 4 L5 epidural steroid injection #3 October, under Fluoroscopic guidance with no sedation  History of present illness: Megan Brock present for reevaluation. She was last seen at the end of December. Since that time she has followed up with Dr. cope because of some exacerbation of her right-sided flank pain. He feels she has had a rupture of one of her kidney cysts. This pain is now under better control and her medications helped with this. She's been taking them as prescribed without any difficulties. We have reviewed [place] practitioner database information from [place] and it is appropriate. She denies diverting or illicit use and has been compliant and I have reviewed her narcotic assessment sheet and she continues to derive good benefit. Of further notes she is having some recurrence of the right side radiating radicular pain going into the right foot for which she responded well with her previous epidural steroid injection.   Previous procedure: L5-S1 2 epidural steroid injection in  2017   Physical exam pupils are equally round and reactive to light  Extraocular muscles are intact   Heart is regular rate and rhythm and lower extremity strength and function remains a baseline with no significant changes noted. She still has baseline flank muscle tenderness.  She has some mild paraspinous muscle tenderness but no overt trigger points. She is ambulating well she does have a positive straight leg raise on the right side at 45.  Assessment   #1 L5 S1 radiculitis right side with lumbar degenerative disc disease that has improved following epidural steroid intervention. #2 chronic low back pain and flank pain  # 3 chronic opioid management  # 4 Polycystic Kidney Disease with chronic opioid maintenance therapy  Plan:  1. We will have her return to clinic in 1 month for a lumbar epidural steroid for  her right side L5 and S1 radiculitis 2. We will refill her medications for the next 2 months. 3. Continue follow-up with Dr. cope for her polycystic kidney disease 4. Continue follow-up with her primary care physicians for her baseline medical care.  Dr. Gwenyth BenderJames Elvenia Godden 2:52 PM

## 2016-06-03 ENCOUNTER — Ambulatory Visit: Payer: Medicare Other | Admitting: Anesthesiology

## 2016-07-20 DIAGNOSIS — F339 Major depressive disorder, recurrent, unspecified: Secondary | ICD-10-CM | POA: Diagnosis not present

## 2016-07-26 ENCOUNTER — Ambulatory Visit (HOSPITAL_BASED_OUTPATIENT_CLINIC_OR_DEPARTMENT_OTHER): Payer: Medicare Other | Admitting: Anesthesiology

## 2016-07-26 ENCOUNTER — Encounter: Payer: Self-pay | Admitting: Anesthesiology

## 2016-07-26 ENCOUNTER — Other Ambulatory Visit: Payer: Self-pay | Admitting: Anesthesiology

## 2016-07-26 ENCOUNTER — Ambulatory Visit
Admission: RE | Admit: 2016-07-26 | Discharge: 2016-07-26 | Disposition: A | Discharge status: Home / Self Care | Payer: Medicare Other | Source: Ambulatory Visit | Attending: Anesthesiology | Admitting: Anesthesiology

## 2016-07-26 VITALS — BP 126/87 | HR 105 | Temp 98.5°F | Resp 14 | Ht 64.0 in | Wt 160.0 lb

## 2016-07-26 DIAGNOSIS — M545 Low back pain: Secondary | ICD-10-CM | POA: Insufficient documentation

## 2016-07-26 DIAGNOSIS — Q613 Polycystic kidney, unspecified: Secondary | ICD-10-CM | POA: Diagnosis not present

## 2016-07-26 DIAGNOSIS — M5116 Intervertebral disc disorders with radiculopathy, lumbar region: Secondary | ICD-10-CM | POA: Insufficient documentation

## 2016-07-26 DIAGNOSIS — G8929 Other chronic pain: Secondary | ICD-10-CM

## 2016-07-26 DIAGNOSIS — M5136 Other intervertebral disc degeneration, lumbar region: Secondary | ICD-10-CM

## 2016-07-26 DIAGNOSIS — R52 Pain, unspecified: Secondary | ICD-10-CM

## 2016-07-26 DIAGNOSIS — R109 Unspecified abdominal pain: Secondary | ICD-10-CM | POA: Insufficient documentation

## 2016-07-26 DIAGNOSIS — M5441 Lumbago with sciatica, right side: Secondary | ICD-10-CM

## 2016-07-26 MED ORDER — TRIAMCINOLONE ACETONIDE 40 MG/ML IJ SUSP: 40.0000 mg | Freq: Once | INTRAMUSCULAR | Status: DC

## 2016-07-26 MED ORDER — MORPHINE SULFATE ER 60 MG PO TBCR: 60.0000 mg | EXTENDED_RELEASE_TABLET | Freq: Three times a day (TID) | ORAL | 0 refills | Status: DC

## 2016-07-26 MED ORDER — HYDROCODONE-ACETAMINOPHEN 10-325 MG PO TABS: 1.0000 | ORAL_TABLET | Freq: Four times a day (QID) | ORAL | 0 refills | Status: DC | PRN

## 2016-07-26 MED ORDER — LACTATED RINGERS IV SOLN: 1000.0000 mL | INTRAVENOUS | Status: DC

## 2016-07-26 MED ORDER — ROPIVACAINE HCL 2 MG/ML IJ SOLN
INTRAMUSCULAR | Status: AC
  Filled 2016-07-26: qty 10

## 2016-07-26 MED ORDER — SODIUM CHLORIDE 0.9% FLUSH: 10.0000 mL | Freq: Once | INTRAVENOUS | Status: DC

## 2016-07-26 MED ORDER — SODIUM CHLORIDE 0.9 % IJ SOLN
INTRAMUSCULAR | Status: AC
  Filled 2016-07-26: qty 10

## 2016-07-26 MED ORDER — TRIAMCINOLONE ACETONIDE 40 MG/ML IJ SUSP
INTRAMUSCULAR | Status: AC
  Filled 2016-07-26: qty 1

## 2016-07-26 MED ORDER — IOPAMIDOL (ISOVUE-M 200) INJECTION 41%
20.0000 mL | Freq: Once | INTRAMUSCULAR | Status: DC | PRN
Start: 2016-07-26 — End: 2016-07-27

## 2016-07-26 MED ORDER — LIDOCAINE HCL (PF) 1 % IJ SOLN
INTRAMUSCULAR | Status: AC
  Filled 2016-07-26: qty 5

## 2016-07-26 MED ORDER — IOPAMIDOL (ISOVUE-M 200) INJECTION 41%
INTRAMUSCULAR | Status: AC
  Filled 2016-07-26: qty 10

## 2016-07-26 MED ORDER — MIDAZOLAM HCL 2 MG/2ML IJ SOLN
5.0000 mg | Freq: Once | INTRAMUSCULAR | Status: DC
  Filled 2016-07-26: qty 5

## 2016-07-26 MED ORDER — LIDOCAINE HCL (PF) 1 % IJ SOLN: 5.0000 mL | Freq: Once | INTRAMUSCULAR | Status: DC

## 2016-07-26 MED ORDER — ROPIVACAINE HCL 2 MG/ML IJ SOLN: 10.0000 mL | Freq: Once | INTRAMUSCULAR | Status: DC

## 2016-07-26 NOTE — Patient Instructions (Addendum)
You were given 2 scripts for morphine and 2 scripts for hydrocodone.   Preparing for your procedure (without sedation) Instructions: . Oral Intake: Do not eat or drink anything for at least 3 hours prior to your procedure. . Transportation: Unless otherwise stated by your physician, you may drive yourself after the procedure. . Blood Pressure Medicine: Take your blood pressure medicine with a sip of water the morning of the procedure. . Insulin: Take only  of your normal insulin dose. . Preventing infections: Shower with an antibacterial soap the morning of your procedure. . Build-up your immune system: Take 1000 mg of Vitamin C with every meal (3 times a day) the day prior to your procedure. . Pregnancy: If you are pregnant, call and cancel the procedure. . Sickness: If you have a cold, fever, or any active infections, call and cancel the procedure. . Arrival: You must be in the facility at least 30 minutes prior to your scheduled procedure. . Children: Do not bring any children with you. . Dress appropriately: Bring dark clothing that you would not mind if they get stained. . Valuables: Do not bring any jewelry or valuables. Procedure appointments are reserved for interventional treatments only. Marland Kitchen No Prescription Refills. . No medication changes will be discussed during procedure appointments. No disability issues will be discussed.Epidural Steroid Injection Patient Information  Description: The epidural space surrounds the nerves as they exit the spinal cord.  In some patients, the nerves can be compressed and inflamed by a bulging disc or a tight spinal canal (spinal stenosis).  By injecting steroids into the epidural space, we can bring irritated nerves into direct contact with a potentially helpful medication.  These steroids act directly on the irritated nerves and can reduce swelling and inflammation which often leads to decreased pain.  Epidural steroids may be injected anywhere  along the spine and from the neck to the low back depending upon the location of your pain.   After numbing the skin with local anesthetic (like Novocaine), a small needle is passed into the epidural space slowly.  You may experience a sensation of pressure while this is being done.  The entire block usually last less than 10 minutes.  Conditions which may be treated by epidural steroids:  Low back and leg pain Neck and arm pain Spinal stenosis Post-laminectomy syndrome Herpes zoster (shingles) pain Pain from compression fractures  Preparation for the injection:  Do not eat any solid food or dairy products within 8 hours of your appointment.  You may drink clear liquids up to 3 hours before appointment.  Clear liquids include water, black coffee, juice or soda.  No milk or cream please. You may take your regular medication, including pain medications, with a sip of water before your appointment  Diabetics should hold regular insulin (if taken separately) and take 1/2 normal NPH dos the morning of the procedure.  Carry some sugar containing items with you to your appointment. A driver must accompany you and be prepared to drive you home after your procedure.  Bring all your current medications with your. An IV may be inserted and sedation may be given at the discretion of the physician.   A blood pressure cuff, EKG and other monitors will often be applied during the procedure.  Some patients may need to have extra oxygen administered for a short period. You will be asked to provide medical information, including your allergies, prior to the procedure.  We must know immediately if you are taking  blood thinners (like Coumadin/Warfarin)  Or if you are allergic to IV iodine contrast (dye). We must know if you could possible be pregnant.  Possible side-effects: Bleeding from needle site Infection (rare, may require surgery) Nerve injury (rare) Numbness & tingling (temporary) Difficulty  urinating (rare, temporary) Spinal headache ( a headache worse with upright posture) Light -headedness (temporary) Pain at injection site (several days) Decreased blood pressure (temporary) Weakness in arm/leg (temporary) Pressure sensation in back/neck (temporary)  Call if you experience: Fever/chills associated with headache or increased back/neck pain. Headache worsened by an upright position. New onset weakness or numbness of an extremity below the injection site Hives or difficulty breathing (go to the emergency room) Inflammation or drainage at the infection site Severe back/neck pain Any new symptoms which are concerning to you  Please note:  Although the local anesthetic injected can often make your back or neck feel good for several hours after the injection, the pain will likely return.  It takes 3-7 days for steroids to work in the epidural space.  You may not notice any pain relief for at least that one week.  If effective, we will often do a series of three injections spaced 3-6 weeks apart to maximally decrease your pain.  After the initial series, we generally will wait several months before considering a repeat injection of the same type.  If you have any questions, please call [phone] . [facility]

## 2016-07-27 ENCOUNTER — Telehealth: Payer: Self-pay | Admitting: *Deleted

## 2016-07-27 NOTE — Telephone Encounter (Signed)
denies problems post procedure 

## 2016-07-27 NOTE — Progress Notes (Signed)
Chief complaint is low back pain and flank pain  Procedure: L4-5 lumbar epidural steroid under fluoroscopic guidance with moderate sedation Previous Procedure: L 4 L5 epidural steroid injection #3 October, under Fluoroscopic guidance with no sedation  History of present illness: Megan Brock presents for reevaluation. She was last seen a couple months ago and since at times had some worsening of her low back and leg pain. This generally is worse on the right side with radiation in the posterior lateral leg with an L5 radiculitis. It is responded favorably in the past to epidural steroids that she receives periodically for her pain. Generally she gets 70% relief lasting several months. She unfortunately has polycystic kidney disease with compression and flank pain of a chronic and intractable nature. She is followed by Dr. cope for this. The quality characteristic addition we should've her flank pain a been stable in nature. She continues to follow up with Dr. cope. She is taking her medications as prescribed with no diverting or illicit use. Minimal side effects are noted and based on her narcotic assessment sheet she does well with this regimen with good lifestyle improvement.   Previous procedure: L5-S1 2 epidural steroid injection in  2017   Physical exam pupils are equally round and reactive to light  Extraocular muscles are intact   Heart is regular rate and rhythm and lower extremity strength and function remains a baseline with no significant changes noted. She still has baseline flank muscle tenderness.  She has some mild paraspinous muscle tenderness but no overt trigger points. She is ambulating well she does have a positive straight leg raise on the right side at 45.  Assessment   #1 L 4 and L4-5 radiculitis right side with lumbar degenerative disc disease that has improved following epidural steroid intervention. #2 chronic low back pain and flank pain  # 3 chronic opioid  management  # 4 Polycystic Kidney Disease with chronic opioid maintenance therapy  Plan:  1. We will have her return to clinic in 2 months for reevaluation and possible repeat epidural steroid injection depending on her response to today's therapy. In the meantime on her to continue with her back stretching strengthening exercises 2. We will refill her medications for the next 2 months. For both Percocet and MS Contin. We have reviewed the [facility] practitioner database and it is appropriate. 3. Continue follow-up with Dr. cope for her polycystic kidney disease 4. Continue follow-up with her primary care physicians for her baseline medical care.  Procedure: L4-5 epidural steroid under fluoroscopic guidance with moderate sedation   Procedure: L4-L5 LESI with fluoroscopic guidance and moderate sedation  NOTE: The risks, benefits, and expectations of the procedure have been discussed and explained to the patient who was understanding and in agreement with suggested treatment plan. No guarantees were made.  DESCRIPTION OF PROCEDURE: Lumbar epidural steroid injection with IV Versed 2 mg, EKG, blood pressure, pulse, and pulse oximetry monitoring. The procedure was performed with the patient in the prone position under fluoroscopic guidance. A local anesthetic skin wheal of 1.5% plain lidocaine was performed at the appropriate site after fluoroscopic identifictation  Using strict aseptic technique, I then advanced an 18-gauge Tuohy epidural needle in the midline via loss-of-resistance to saline technique. There was negative aspiration for heme or  CSF.  I then confirmed position with both AP and Lateral fluoroscan. At L4-5  A total of 5 mL of Preservative-Free normal saline mixed with 40 mg of Kenalog and 1cc Ropicaine 0.2  percent was injected incrementally via the  epidurally placed needle. The needle was removed. The patient tolerated the injection well and was convalesced and discharged to home in  stable condition. Should the patient have any post procedure difficulty they have been instructed on how to contact us for assistance.    Dr. Gwenyth Bender 10:59 AM

## 2016-08-04 ENCOUNTER — Ambulatory Visit: Payer: Medicare Other | Admitting: Anesthesiology

## 2016-08-09 ENCOUNTER — Other Ambulatory Visit: Payer: Self-pay | Admitting: Anesthesiology

## 2016-08-12 NOTE — Telephone Encounter (Signed)
Needs refill on muscle relaxer e scripted only has a couple left, she ask him about it at last appt and dr Pernell Dupre told her to call in to clinic and he would take care of it.

## 2016-08-16 ENCOUNTER — Telehealth: Payer: Self-pay | Admitting: Anesthesiology

## 2016-08-16 NOTE — Telephone Encounter (Signed)
Refill of Flexeril  one at [facility] as needed called in to [facility] with one refill. Patient notified.

## 2016-08-16 NOTE — Telephone Encounter (Signed)
Did not get refill on muscle relaxer called in at visit, per dr Pernell Dupre told patient to call and we would send to pharmacy.  She says pharmacy told her refill was denied. Please call patient as she will be out of these meds soon.

## 2016-10-11 ENCOUNTER — Ambulatory Visit: Payer: Medicare Other | Attending: Anesthesiology | Admitting: Nurse Practitioner

## 2016-10-11 ENCOUNTER — Encounter: Payer: Self-pay | Admitting: Nurse Practitioner

## 2016-10-11 VITALS — BP 134/84 | HR 144 | Temp 98.7°F | Resp 18

## 2016-10-11 DIAGNOSIS — Q613 Polycystic kidney, unspecified: Secondary | ICD-10-CM | POA: Diagnosis not present

## 2016-10-11 DIAGNOSIS — G8929 Other chronic pain: Secondary | ICD-10-CM | POA: Diagnosis not present

## 2016-10-11 DIAGNOSIS — R5381 Other malaise: Secondary | ICD-10-CM | POA: Insufficient documentation

## 2016-10-11 DIAGNOSIS — M545 Low back pain: Secondary | ICD-10-CM | POA: Insufficient documentation

## 2016-10-11 DIAGNOSIS — R310 Gross hematuria: Secondary | ICD-10-CM | POA: Diagnosis not present

## 2016-10-11 DIAGNOSIS — K759 Inflammatory liver disease, unspecified: Secondary | ICD-10-CM | POA: Insufficient documentation

## 2016-10-11 DIAGNOSIS — Z79891 Long term (current) use of opiate analgesic: Secondary | ICD-10-CM | POA: Insufficient documentation

## 2016-10-11 DIAGNOSIS — M5441 Lumbago with sciatica, right side: Secondary | ICD-10-CM

## 2016-10-11 DIAGNOSIS — M47816 Spondylosis without myelopathy or radiculopathy, lumbar region: Secondary | ICD-10-CM | POA: Diagnosis not present

## 2016-10-11 DIAGNOSIS — F419 Anxiety disorder, unspecified: Secondary | ICD-10-CM | POA: Insufficient documentation

## 2016-10-11 DIAGNOSIS — E039 Hypothyroidism, unspecified: Secondary | ICD-10-CM | POA: Insufficient documentation

## 2016-10-11 DIAGNOSIS — G894 Chronic pain syndrome: Secondary | ICD-10-CM | POA: Insufficient documentation

## 2016-10-11 DIAGNOSIS — Q612 Polycystic kidney, adult type: Secondary | ICD-10-CM | POA: Diagnosis not present

## 2016-10-11 DIAGNOSIS — R5383 Other fatigue: Secondary | ICD-10-CM | POA: Diagnosis not present

## 2016-10-11 DIAGNOSIS — N200 Calculus of kidney: Secondary | ICD-10-CM | POA: Insufficient documentation

## 2016-10-11 MED ORDER — HYDROCODONE-ACETAMINOPHEN 10-325 MG PO TABS: 1.0000 | ORAL_TABLET | Freq: Four times a day (QID) | ORAL | 0 refills | Status: DC | PRN

## 2016-10-11 MED ORDER — CYCLOBENZAPRINE HCL 10 MG PO TABS: 10.0000 mg | ORAL_TABLET | Freq: Every day | ORAL | 1 refills | Status: AC

## 2016-10-11 MED ORDER — MORPHINE SULFATE ER 60 MG PO TBCR: 60.0000 mg | EXTENDED_RELEASE_TABLET | Freq: Three times a day (TID) | ORAL | 0 refills | Status: DC

## 2016-10-11 NOTE — Progress Notes (Signed)
Patient's Name: Megan Brock  MRN: [id number]  Referring Provider: Birdie Sons, MD  DOB: [birth year]  PCP: Birdie Sons, MD  DOS: 10/11/2016  Note by: Vevelyn Francois NP  Service setting: Ambulatory outpatient  Specialty: Interventional Pain Management  Location: [facility] (AMB) Pain Management Facility    Patient type: Established    Primary Reason(s) for Visit: Encounter for prescription drug management (Level of risk: moderate) CC: Back Pain (lower)  HPI  Megan Brock is a 36 y.o. year old, female patient, who comes today for a medication management evaluation. She has Polycystic kidney disease; Chronic lower back pain; Chronic, continuous use of opioids; Hepatitis; Kidney stone; Hypothyroidism; Anxiety; Gross hematuria; Malaise and fatigue; Disorder of calcium metabolism; Polycystic kidney, autosomal dominant; Long term current use of opiate analgesic; Chronic pain syndrome; and Lumbar spondylosis on her problem list. Her primarily concern today is the Back Pain (lower)  Pain Assessment: Location: Lower Back Radiating: abdomen Onset: More than a month ago Duration: Chronic pain Quality: Aching, Sharp Severity: 4 /10 (self-reported pain score)  Note:             Reported level is compatible with observation.       Effect on ADL:   Timing: Constant Modifying factors: heating pad, medications, rest  Megan Brock was last scheduled for an appointment on Visit date not found for medication management. During today's appointment we reviewed Megan Brock's chronic pain status, as well as her outpatient medication regimen. She has chronic low back pain. She has radicular symptoms that do down into her right leg posteriorly to the knee mainly. She has any numbness, tingling. She denies any weakness. She states that she has swelling on the right that radiates into her abdomen. She admits that her pain is related to large cyst that she has on her kidneys.  The patient  reports that she does not  use drugs. Her body mass index is unknown because there is no height or weight on file.  Further details on both, my assessment(s), as well as the proposed treatment plan, please see below.  Controlled Substance Pharmacotherapy Assessment REMS (Risk Evaluation and Mitigation Strategy)  Analgesic: Morphine sulfate extended release 60 mg twice daily with hydrocodone/acetaminophen 10/325 mg twice daily  MME/day: 155 mg/day.  Landis Martins, RN  10/11/2016  2:55 PM  Sign at close encounter Nursing Pain Medication Assessment:  Safety precautions to be maintained throughout the outpatient stay will include: orient to surroundings, keep bed in low position, maintain call bell within reach at all times, provide assistance with transfer out of bed and ambulation.  Medication Inspection Compliance: Megan Brock did not comply with our request to bring her pills to be counted. She was reminded that bringing the medication bottles, even when empty, is a requirement.  Medication: None brought in. Pill/Patch Count: None available to be counted. Bottle Appearance: No container available. Did not bring bottle(s) to appointment. Filled Date: N/A Last Medication intake:  Today   Pharmacokinetics: Liberation and absorption (onset of action): WNL Distribution (time to peak effect): WNL Metabolism and excretion (duration of action): WNL         Pharmacodynamics: Desired effects: Analgesia: Megan Brock reports >50% benefit. Functional ability: Patient reports that medication allows her to accomplish basic ADLs Clinically meaningful improvement in function (CMIF): Sustained CMIF goals met Perceived effectiveness: Described as relatively effective, allowing for increase in activities of daily living (ADL) Undesirable effects: Side-effects or Adverse reactions: None reported Monitoring: [place] PMP: Online  review of the past 13-monthperiod conducted. Compliant with practice rules and regulations List of all UDS  test(s) done:  Lab Results  Component Value Date   TOXASSSELUR FINAL 04/15/2016   [place] 09/01/2015   [place] 03/20/2015   Last UDS on record: ToxAssure Select 13  Date Value Ref Range Status  04/15/2016 FINAL  Final    Comment:    ==================================================================== TOXASSURE SELECT 13 (MW) ==================================================================== Test                             Result       Flag       Units Drug Present and Declared for Prescription Verification   Morphine                       >4608        EXPECTED   ng/mg creat    Potential sources of large amounts of morphine in the absence of    codeine include administration of morphine or use of heroin.   Hydrocodone                    88           EXPECTED   ng/mg creat   Hydromorphone                  257          EXPECTED   ng/mg creat   Dihydrocodeine                 25           EXPECTED   ng/mg creat   Norhydrocodone                 359          EXPECTED   ng/mg creat    Sources of hydrocodone include scheduled prescription    medications. Hydromorphone, dihydrocodeine and norhydrocodone are    expected metabolites of hydrocodone. Hydromorphone and    dihydrocodeine are also available as scheduled prescription    medications. ==================================================================== Test                      Result    Flag   Units      Ref Range   Creatinine              217              mg/dL      >=20 ==================================================================== Declared Medications:  The flagging and interpretation on this report are based on the  following declared medications.  Unexpected results may arise from  inaccuracies in the declared medications.  **Note: The testing scope of this panel includes these medications:  Hydrocodone (Hydrocodone-Acetaminophen)  Morphine (Morphine Sulfate)  **Note: The testing scope of this  panel does not include following  reported medications:  Acetaminophen (Hydrocodone-Acetaminophen)  Lisinopril ==================================================================== For clinical consultation, please call ([phone] ====================================================================    UDS interpretation: Compliant          Medication Assessment Form: Reviewed. Patient indicates being compliant with therapy Treatment compliance: Compliant Risk Assessment Profile: Aberrant behavior: See prior evaluations. None observed or detected today Comorbid factors increasing risk of overdose: See prior notes. No additional risks detected today Risk of substance use disorder (SUD): Low Opioid Risk Tool (ORT) Total Score: 1  Interpretation Table:  Score <3 = Low Risk for SUD  Score between 4-7 = Moderate Risk for SUD  Score >8 = High Risk for Opioid Abuse   Risk Mitigation Strategies:  Patient Counseling: Covered Patient-Prescriber Agreement (PPA): Present and active  Notification to other healthcare providers: Done  Pharmacologic Plan: No change in therapy, at this time  Laboratory Chemistry  Inflammation Markers No results found for: CRP, ESRSEDRATE (CRP: Acute Phase) (ESR: Chronic Phase) Renal Function Markers Lab Results  Component Value Date   BUN 23 (H) 08/18/2015   CREATININE 0.84 08/18/2015   GFRAA 106 08/18/2015   GFRNONAA 92 08/18/2015   Hepatic Function Markers Lab Results  Component Value Date   ALBUMIN 4.4 08/18/2015   Electrolytes Lab Results  Component Value Date   NA 142 08/18/2015   K 4.2 08/18/2015   CL 104 08/18/2015   CALCIUM 9.5 08/18/2015   Neuropathy Markers No results found for: [id number] Bone Pathology Markers Lab Results  Component Value Date   CALCIUM 9.5 08/18/2015   Coagulation Parameters No results found for: INR, LABPROT, APTT, PLT Cardiovascular Markers No results found for: BNP, HGB, HCT Note: Lab results  reviewed.  Recent Diagnostic Imaging Review  Dg C-arm 1-60 Min-no Report  Result Date: 07/26/2016 Fluoroscopy was utilized by the requesting physician.  No radiographic interpretation.   Note: Imaging results reviewed.          Meds    Current Outpatient Prescriptions (Cardiovascular):  .  lisinopril (PRINIVIL,ZESTRIL) 10 MG tablet, Take 1 tablet (10 mg total) by mouth daily. Marland Kitchen  lisinopril (PRINIVIL,ZESTRIL) 10 MG tablet, Take 10 mg by mouth.   Current Outpatient Prescriptions (Analgesics):  .  [START ON 11/10/2016] HYDROcodone-acetaminophen (NORCO) 10-325 MG tablet, Take 1 tablet by mouth every 6 (six) hours as needed. Derrill Memo ON 11/10/2016] morphine (MS CONTIN) 60 MG 12 hr tablet, Take 1 tablet (60 mg total) by mouth 3 (three) times daily.   Current Outpatient Prescriptions (Other):  .  cyclobenzaprine (FLEXERIL) 10 MG tablet, Take 1 tablet (10 mg total) by mouth at bedtime. Marland Kitchen  FLUVIRIN 0.5 ML SUSY, ADM 0.5ML IM UTD .  cyclobenzaprine (FLEXERIL) 10 MG tablet, Take 10 mg by mouth.  ROS  Constitutional: Denies any fever or chills Gastrointestinal: No reported hemesis, hematochezia, vomiting, or acute GI distress Musculoskeletal: Denies any acute onset joint swelling, redness, loss of ROM, or weakness Neurological: No reported episodes of acute onset apraxia, aphasia, dysarthria, agnosia, amnesia, paralysis, loss of coordination, or loss of consciousness  Allergies  Ms. Sample has No Known Allergies.  [place]  Drug: Ms. Chervenak  reports that she does not use drugs. Alcohol:  reports that she does not drink alcohol. Tobacco:  reports that she has been smoking Cigarettes.  She has been smoking about 1.00 pack per day. She has never used smokeless tobacco. Medical:  has a past medical history of History of chicken pox. Surgical: Ms. Bartley  has a past surgical history that includes Cesarean section and Tubal ligation. Family: family history includes Arthritis in her father and  mother; Cancer in her father; Early death in her father; Heart attack in her father; Heart disease in her father; Hypertension in her brother, father, and mother; Kidney disease in her father.  Constitutional Exam  General appearance: Well nourished, well developed, and well hydrated. In no apparent acute distress Vitals:   10/11/16 1451  BP: 134/84  Pulse: (!) 144  Resp: 18  Temp: 98.7 F (37.1 C)  TempSrc: Oral  SpO2: 100%  BMI Assessment: Estimated body mass index is 27.46 kg/m as calculated from the following:   Height as of 07/26/16: 5' 4"  (1.626 m).   Weight as of 07/26/16: 160 lb (72.6 kg).  BMI interpretation table: BMI level Category Range association with higher incidence of chronic pain  <18 kg/m2 Underweight   18.5-24.9 kg/m2 Ideal body weight   25-29.9 kg/m2 Overweight Increased incidence by 20%  30-34.9 kg/m2 Obese (Class I) Increased incidence by 68%  35-39.9 kg/m2 Severe obesity (Class II) Increased incidence by 136%  >40 kg/m2 Extreme obesity (Class III) Increased incidence by 254%   BMI Readings from Last 4 Encounters:  07/26/16 27.46 kg/m  06/02/16 26.78 kg/m  04/14/16 27.46 kg/m  01/19/16 27.46 kg/m   Wt Readings from Last 4 Encounters:  07/26/16 160 lb (72.6 kg)  06/02/16 156 lb (70.8 kg)  04/14/16 160 lb (72.6 kg)  01/19/16 160 lb (72.6 kg)  Psych/Mental status: Alert, oriented x 3 (person, place, & time)       Eyes: PERLA Respiratory: No evidence of acute respiratory distress  Cervical Spine Exam  Inspection: No masses, redness, or swelling Alignment: Symmetrical Functional ROM: Unrestricted ROM      Stability: No instability detected Muscle strength & Tone: Functionally intact Sensory: Unimpaired Palpation: No palpable anomalies              Upper Extremity (UE) Exam    Side: Right upper extremity  Side: Left upper extremity  Inspection: No masses, redness, swelling, or asymmetry. No contractures  Inspection: No masses, redness, swelling,  or asymmetry. No contractures  Functional ROM: Unrestricted ROM          Functional ROM: Unrestricted ROM          Muscle strength & Tone: Functionally intact  Muscle strength & Tone: Functionally intact  Sensory: Unimpaired  Sensory: Unimpaired  Palpation: No palpable anomalies              Palpation: No palpable anomalies              Specialized Test(s): Deferred         Specialized Test(s): Deferred          Thoracic Spine Exam  Inspection: No masses, redness, or swelling Alignment: Symmetrical Functional ROM: Unrestricted ROM Stability: No instability detected Sensory: Unimpaired Muscle strength & Tone: No palpable anomalies  Lumbar Spine Exam  Inspection: swellingto Right lumbar and  Flank area Alignment: Symmetrical Functional ROM: Pain restricted ROM      Stability: No instability detected Muscle strength & Tone: Increased muscle tone over affected area Sensory: Unimpaired Palpation: Complains of area being tender to palpation       Provocative Tests: Lumbar Hyperextension and rotation test: Unable to perform       Patrick's Maneuver: evaluation deferred today                    Gait & Posture Assessment  Ambulation: Unassisted Gait: Relatively normal for age and body habitus Posture: WNL   Lower Extremity Exam    Side: Right lower extremity  Side: Left lower extremity  Inspection: No masses, redness, swelling, or asymmetry. No contractures  Inspection: No masses, redness, swelling, or asymmetry. No contractures  Functional ROM: Unrestricted ROM          Functional ROM: Unrestricted ROM          Muscle strength & Tone: Functionally intact  Muscle strength & Tone: Functionally intact  Sensory: Unimpaired  Sensory: Unimpaired  Palpation: No palpable anomalies  Palpation: No palpable anomalies   Assessment  Primary Diagnosis & Pertinent Problem List: The primary encounter diagnosis was Lumbar spondylosis. Diagnoses of Chronic bilateral low back pain with right-sided  sciatica, Polycystic kidney disease, Chronic pain syndrome, and Long term current use of opiate analgesic were also pertinent to this visit.  Status Diagnosis  Controlled Controlled Controlled 1. Lumbar spondylosis   2. Chronic bilateral low back pain with right-sided sciatica   3. Polycystic kidney disease   4. Chronic pain syndrome   5. Long term current use of opiate analgesic     Problems updated and reviewed during this visit: Problem  Chronic Pain Syndrome  Lumbar Spondylosis  Chronic Lower Back Pain  Long Term Current Use of Opiate Analgesic  Chronic, Continuous Use of Opioids  Kidney Stone  Hypothyroidism  Anxiety  Hepatitis  Polycystic Kidney Disease  Polycystic Kidney, Autosomal Dominant  Disorder of Calcium Metabolism  Malaise and Fatigue  Gross Hematuria   Plan of Care  Pharmacotherapy (Medications Ordered): Meds ordered this encounter  Medications  . cyclobenzaprine (FLEXERIL) 10 MG tablet    Sig: Take 1 tablet (10 mg total) by mouth at bedtime.    Dispense:  30 tablet    Refill:  1    Order Specific Question:   Supervising Provider    Answer:   Molli Barrows [[id number]]  . HYDROcodone-acetaminophen (NORCO) 10-325 MG tablet    Sig: Take 1 tablet by mouth every 6 (six) hours as needed.    Dispense:  45 tablet    Refill:  0    Do not fill until 11/10/16    Order Specific Question:   Supervising Provider    Answer:   Molli Barrows [[id number]]  . morphine (MS CONTIN) 60 MG 12 hr tablet    Sig: Take 1 tablet (60 mg total) by mouth 3 (three) times daily.    Dispense:  75 tablet    Refill:  0    Do not fill until 11/10/16    Order Specific Question:   Supervising Provider    Answer:   Molli Barrows [[id number]]   New Prescriptions   No medications on file   Medications administered today: Ms. Newsom had no medications administered during this visit. Lab-work, procedure(s), and/or referral(s): Orders Placed This Encounter  Procedures  . ToxASSURE Select 13 (MW),  Urine   Imaging and/or referral(s): None  Interventional therapies: Planned, scheduled, and/or pending:   Not at this time.   Considering:   Lumbar epidural steroid injection    Palliative PRN treatment(s):   Palliative lumbar epidural steroid injection    Provider-requested follow-up: Return in about 2 months (around 12/11/2016) for MedMgmt.  No future appointments. Primary Care Physician: Birdie Sons, MD Location: [facility] Note by: Vevelyn Francois NP Date: 10/11/2016; Time: 4:01 PM  Pain Score Disclaimer: We use the NRS-11 scale. This is a self-reported, subjective measurement of pain severity with only modest accuracy. It is used primarily to identify changes within a particular patient. It must be understood that outpatient pain scales are significantly less accurate that those used for research, where they can be applied under ideal controlled circumstances with minimal exposure to variables. In reality, the score is likely to be a combination of pain intensity and pain affect, where pain affect describes the degree of emotional arousal or changes in action readiness caused by the sensory experience of pain. Factors such as social and work situation,  setting, emotional state, anxiety levels, expectation, and prior pain experience may influence pain perception and show large inter-individual differences that may also be affected by time variables.  Patient instructions provided during this appointment: Patient Instructions   ____________________________________________________________________________________________  Medication Rules  Applies to: All patients receiving prescriptions (written or electronic).  Pharmacy of record: Pharmacy where electronic prescriptions will be sent. If written prescriptions are taken to a different pharmacy, please inform the nursing staff. The pharmacy listed in the electronic medical record should be the one  where you would like electronic prescriptions to be sent.  Prescription refills: Only during scheduled appointments. Applies to both, written and electronic prescriptions.  NOTE: The following applies primarily to controlled substances (Opioid Pain Medications)  Patient's responsibilities: 1. Pain Pills: Bring all pain pills to every appointment (except for procedure appointments). 2. Pill Bottles: Bring pills in original pharmacy bottle. Always bring newest bottle. Bring bottle, even if empty. 3. Medication refills: You are responsible for knowing and keeping track of what medications you need refilled. The day before your appointment, write a list of all prescriptions that need to be refilled. Bring that list to your appointment and give it to the admitting nurse. Prescriptions will be written only during appointments. If you forget a medication, it will not be "Called in", "Faxed", or "electronically sent". You will need to get another appointment to get these prescribed. 4. Prescription Accuracy: You are responsible for carefully inspecting your prescriptions before leaving our office. Have the discharge nurse carefully go over each prescription with you, before taking them home. Make sure that your name is accurately spelled, that your address is correct. Check the name and dose of your medication to make sure it is accurate. Check the number of pills, and the written instructions to make sure they are clear and accurate. Make sure that you are given enough medication to last until your next medication refill appointment. 5. Taking Medication: Take medication as prescribed. Never take more pills than instructed. Never take medication more frequently than prescribed. Taking less pills or less frequently is permitted and encouraged, when it comes to controlled substances (written prescriptions).  6. Inform other Doctors: Always inform, all of your healthcare providers, of all the medications you  take. 7. Pain Medication from other Providers: You are not allowed to accept any additional pain medication from any other Doctor or Healthcare provider. There are two exceptions to this rule. (see below) In the event that you require additional pain medication, you are responsible for notifying us, as stated below. 8. Medication Agreement: You are responsible for carefully reading and following our Medication Agreement. This must be signed before receiving any prescriptions from our practice. Safely store a copy of your signed Agreement. Violations to the Agreement will result in no further prescriptions. (Additional copies of our Medication Agreement are available upon request.) 9. Laws, Rules, & Regulations: All patients are expected to follow all Federal and [employer], Rules, [employer]. Ignorance of the Laws does not constitute a valid excuse.  Exceptions: There are only two exceptions to the rule of not receiving pain medications from other Healthcare Providers. 1. Exception #1 (Emergencies): In the event of an emergency (i.e.: accident requiring emergency care), you are allowed to receive additional pain medication. However, you are responsible for: As soon as you are able, call our office [phone], at any time of the day or night, and leave a message stating your name, the date and nature of the emergency, and  the name and dose of the medication prescribed. In the event that your call is answered by a member of our staff, make sure to document and save the date, time, and the name of the person that took your information.  2. Exception #2 (Planned Surgery): In the event that you are scheduled by another doctor or dentist to have any type of surgery or procedure, you are allowed (for a period no longer than 30 days), to receive additional pain medication, for the acute post-op pain. However, in this case, you are responsible for picking up a copy of our "Post-op Pain Management for  Surgeons" handout, and giving it to your surgeon or dentist. This document is available at our office, and does not require an appointment to obtain it. Simply go to our office during business hours (Monday-Thursday from 8:00 AM to 4:00 PM) (Friday 8:00 AM to 12:00 Noon) or if you have a scheduled appointment with [place], prior to your surgery, and ask for it by name. In addition, you will need to provide [place] with your name, name of your surgeon, type of surgery, and date of procedure or surgery.  A prescription for Cyclobenzaprine was sent to your pharmacy. You were given 2 prescriptions each for Hydrocodone and MS Contin. Please always bring your last pill bottle to your appointment.  ____________________________________________________________________________________________

## 2016-10-11 NOTE — Progress Notes (Signed)
Nursing Pain Medication Assessment:  Safety precautions to be maintained throughout the outpatient stay will include: orient to surroundings, keep bed in low position, maintain call bell within reach at all times, provide assistance with transfer out of bed and ambulation.  Medication Inspection Compliance: [name] did not comply with our request to bring her pills to be counted. She was reminded that bringing the medication bottles, even when empty, is a requirement.  Medication: None brought in. Pill/Patch Count: None available to be counted. Bottle Appearance: No container available. Did not bring bottle(s) to appointment. Filled Date: N/A Last Medication intake:  Today

## 2016-10-11 NOTE — Patient Instructions (Addendum)
____________________________________________________________________________________________  Medication Rules  Applies to: All patients receiving prescriptions (written or electronic).  Pharmacy of record: Pharmacy where electronic prescriptions will be sent. If written prescriptions are taken to a different pharmacy, please inform the nursing staff. The pharmacy listed in the electronic medical record should be the one where you would like electronic prescriptions to be sent.  Prescription refills: Only during scheduled appointments. Applies to both, written and electronic prescriptions.  NOTE: The following applies primarily to controlled substances (Opioid Pain Medications)  Patient's responsibilities: 1. Pain Pills: Bring all pain pills to every appointment (except for procedure appointments). 2. Pill Bottles: Bring pills in original pharmacy bottle. Always bring newest bottle. Bring bottle, even if empty. 3. Medication refills: You are responsible for knowing and keeping track of what medications you need refilled. The day before your appointment, write a list of all prescriptions that need to be refilled. Bring that list to your appointment and give it to the admitting nurse. Prescriptions will be written only during appointments. If you forget a medication, it will not be "Called in", "Faxed", or "electronically sent". You will need to get another appointment to get these prescribed. 4. Prescription Accuracy: You are responsible for carefully inspecting your prescriptions before leaving our office. Have the discharge nurse carefully go over each prescription with you, before taking them home. Make sure that your name is accurately spelled, that your address is correct. Check the name and dose of your medication to make sure it is accurate. Check the number of pills, and the written instructions to make sure they are clear and accurate. Make sure that you are given enough medication to last  until your next medication refill appointment. 5. Taking Medication: Take medication as prescribed. Never take more pills than instructed. Never take medication more frequently than prescribed. Taking less pills or less frequently is permitted and encouraged, when it comes to controlled substances (written prescriptions).  6. Inform other Doctors: Always inform, all of your healthcare providers, of all the medications you take. 7. Pain Medication from other Providers: You are not allowed to accept any additional pain medication from any other Doctor or Healthcare provider. There are two exceptions to this rule. (see below) In the event that you require additional pain medication, you are responsible for notifying us, as stated below. 8. Medication Agreement: You are responsible for carefully reading and following our Medication Agreement. This must be signed before receiving any prescriptions from our practice. Safely store a copy of your signed Agreement. Violations to the Agreement will result in no further prescriptions. (Additional copies of our Medication Agreement are available upon request.) 9. Laws, Rules, & Regulations: All patients are expected to follow all Federal and [employer], Rules, [employer]. Ignorance of the Laws does not constitute a valid excuse.  Exceptions: There are only two exceptions to the rule of not receiving pain medications from other Healthcare Providers. 1. Exception #1 (Emergencies): In the event of an emergency (i.e.: accident requiring emergency care), you are allowed to receive additional pain medication. However, you are responsible for: As soon as you are able, call our office [phone], at any time of the day or night, and leave a message stating your name, the date and nature of the emergency, and the name and dose of the medication prescribed. In the event that your call is answered by a member of our staff, make sure to document and save the date,  time, and the name of the person that  took your information.  2. Exception #2 (Planned Surgery): In the event that you are scheduled by another doctor or dentist to have any type of surgery or procedure, you are allowed (for a period no longer than 30 days), to receive additional pain medication, for the acute post-op pain. However, in this case, you are responsible for picking up a copy of our "Post-op Pain Management for Surgeons" handout, and giving it to your surgeon or dentist. This document is available at our office, and does not require an appointment to obtain it. Simply go to our office during business hours (Monday-Thursday from 8:00 AM to 4:00 PM) (Friday 8:00 AM to 12:00 Noon) or if you have a scheduled appointment with us, prior to your surgery, and ask for it by name. In addition, you will need to provide us with your name, name of your surgeon, type of surgery, and date of procedure or surgery.  A prescription for Cyclobenzaprine was sent to your pharmacy. You were given 2 prescriptions each for Hydrocodone and MS Contin. Please always bring your last pill bottle to your appointment.  ____________________________________________________________________________________________

## 2016-10-16 LAB — TOXASSURE SELECT 13 (MW), URINE

## 2016-10-17 ENCOUNTER — Other Ambulatory Visit: Payer: Self-pay | Admitting: Anesthesiology

## 2016-10-18 DIAGNOSIS — F339 Major depressive disorder, recurrent, unspecified: Secondary | ICD-10-CM | POA: Diagnosis not present

## 2016-11-25 DIAGNOSIS — Z87442 Personal history of urinary calculi: Secondary | ICD-10-CM | POA: Diagnosis not present

## 2016-11-25 DIAGNOSIS — R109 Unspecified abdominal pain: Secondary | ICD-10-CM | POA: Diagnosis not present

## 2016-11-25 DIAGNOSIS — N200 Calculus of kidney: Secondary | ICD-10-CM | POA: Diagnosis not present

## 2016-11-25 DIAGNOSIS — N3941 Urge incontinence: Secondary | ICD-10-CM | POA: Diagnosis not present

## 2016-11-25 DIAGNOSIS — R310 Gross hematuria: Secondary | ICD-10-CM | POA: Diagnosis not present

## 2016-11-25 DIAGNOSIS — F17210 Nicotine dependence, cigarettes, uncomplicated: Secondary | ICD-10-CM | POA: Diagnosis not present

## 2016-11-25 DIAGNOSIS — Q612 Polycystic kidney, adult type: Secondary | ICD-10-CM | POA: Diagnosis not present

## 2016-11-25 DIAGNOSIS — R5383 Other fatigue: Secondary | ICD-10-CM | POA: Diagnosis not present

## 2016-11-25 DIAGNOSIS — R5381 Other malaise: Secondary | ICD-10-CM | POA: Diagnosis not present

## 2016-12-07 ENCOUNTER — Ambulatory Visit: Payer: Medicare Other | Attending: Anesthesiology | Admitting: Anesthesiology

## 2016-12-07 ENCOUNTER — Encounter: Payer: Self-pay | Admitting: Anesthesiology

## 2016-12-07 VITALS — BP 134/78 | HR 75 | Temp 98.3°F | Resp 16 | Ht 64.0 in | Wt 160.0 lb

## 2016-12-07 DIAGNOSIS — R109 Unspecified abdominal pain: Secondary | ICD-10-CM | POA: Diagnosis not present

## 2016-12-07 DIAGNOSIS — G894 Chronic pain syndrome: Secondary | ICD-10-CM | POA: Diagnosis not present

## 2016-12-07 DIAGNOSIS — Q613 Polycystic kidney, unspecified: Secondary | ICD-10-CM | POA: Diagnosis not present

## 2016-12-07 DIAGNOSIS — M79604 Pain in right leg: Secondary | ICD-10-CM | POA: Diagnosis not present

## 2016-12-07 DIAGNOSIS — M545 Low back pain: Secondary | ICD-10-CM | POA: Diagnosis present

## 2016-12-07 DIAGNOSIS — M5432 Sciatica, left side: Secondary | ICD-10-CM

## 2016-12-07 DIAGNOSIS — G8929 Other chronic pain: Secondary | ICD-10-CM | POA: Diagnosis not present

## 2016-12-07 DIAGNOSIS — M47816 Spondylosis without myelopathy or radiculopathy, lumbar region: Secondary | ICD-10-CM | POA: Diagnosis not present

## 2016-12-07 DIAGNOSIS — M5442 Lumbago with sciatica, left side: Secondary | ICD-10-CM | POA: Diagnosis not present

## 2016-12-07 DIAGNOSIS — M25551 Pain in right hip: Secondary | ICD-10-CM | POA: Insufficient documentation

## 2016-12-07 DIAGNOSIS — M25552 Pain in left hip: Secondary | ICD-10-CM | POA: Insufficient documentation

## 2016-12-07 MED ORDER — MORPHINE SULFATE ER 60 MG PO TBCR: 60.0000 mg | EXTENDED_RELEASE_TABLET | Freq: Three times a day (TID) | ORAL | 0 refills | Status: DC

## 2016-12-07 MED ORDER — HYDROCODONE-ACETAMINOPHEN 10-325 MG PO TABS: 1.0000 | ORAL_TABLET | Freq: Four times a day (QID) | ORAL | 0 refills | Status: DC | PRN

## 2016-12-07 MED ORDER — NALOXONE HCL 4 MG/0.1ML NA LIQD: NASAL | 2 refills | Status: AC

## 2016-12-07 MED ORDER — CYCLOBENZAPRINE HCL 10 MG PO TABS: 10.0000 mg | ORAL_TABLET | Freq: Every day | ORAL | 3 refills | Status: DC

## 2016-12-07 NOTE — Progress Notes (Signed)
 Subjective:  Patient ID: Megan Brock, female    DOB: 10/05/1980  Age: 36 y.o. MRN: [id number]  CC: Back Pain (lower)   HPI Megan Brock presents for A new patient evaluation. She is last seen 2 months ago and has been doing well with her bilateral flank pain. She still has spasming in the low back lumbar region with frequent radiating pain into the buttocks and hips. She has had an epidural steroid in the distant past that helped with her right lower extremity pain however present cheeks experiencing some pain radiating into the left calf of a cramping gnawing nature. It is similar to what she previously experienced in the left  lower leg. No new changes in bowel or bladder function are noted at this time. She's been doing well with her medication regimen which has been stable in nature based on her narcotic assessment sheet she continues to derive good lifestyle functional benefit with her medications. No change in bowel or bladder function is noted. She has recently seen Dr. cope and has been followed for her polycystic kidney disease.  Outpatient Medications Prior to Visit  Medication Sig Dispense Refill  . cyclobenzaprine (FLEXERIL) 10 MG tablet Take 10 mg by mouth.    . lisinopril (PRINIVIL,ZESTRIL) 10 MG tablet Take 1 tablet (10 mg total) by mouth daily. 30 tablet 11  . HYDROcodone-acetaminophen (NORCO) 10-325 MG tablet Take 1 tablet by mouth every 6 (six) hours as needed. 45 tablet 0  . morphine (MS CONTIN) 60 MG 12 hr tablet Take 1 tablet (60 mg total) by mouth 3 (three) times daily. 75 tablet 0  . cyclobenzaprine (FLEXERIL) 10 MG tablet Take 1 tablet (10 mg total) by mouth at bedtime. (Patient not taking: Reported on 12/07/2016) 30 tablet 1  . FLUVIRIN 0.5 ML SUSY ADM 0.5ML IM UTD  0  . lisinopril (PRINIVIL,ZESTRIL) 10 MG tablet Take 10 mg by mouth.    . cyclobenzaprine (FLEXERIL) 10 MG tablet TAKE 1 TABLET FOR PAIN EVERY NIGHT AT BEDTIME AS NEEDED (Patient not taking: Reported on  12/07/2016) 30 tablet 0   No facility-administered medications prior to visit.    ROS Review of Systems  Objective:  BP 134/78   Pulse 75   Temp 98.3 F (36.8 C)   Resp 16   Ht 5' 4" (1.626 m)   Wt 160 lb (72.6 kg)   SpO2 98%   BMI 27.46 kg/m    BP Readings from Last 3 Encounters:  12/07/16 134/78  10/11/16 134/84  07/26/16 126/87     Wt Readings from Last 3 Encounters:  12/07/16 160 lb (72.6 kg)  07/26/16 160 lb (72.6 kg)  06/02/16 156 lb (70.8 kg)     Physical Exam  PERRL EOMI HEART IS RRR  LCTA MUSCULOSKELETALReveals some bilateral paraspinous muscle tenderness in the lumbar region.  Labs  No results found for: HGBA1C Lab Results  Component Value Date   LDLCALC 105 (H) 07/17/2015   CREATININE 0.84 08/18/2015    -------------------------------------------------------------------------------------------------------------------- Lab Results  Component Value Date   GLUCOSE 99 08/18/2015   CHOL 174 07/17/2015   TRIG 139 07/17/2015   HDL 41 07/17/2015   LDLCALC 105 (H) 07/17/2015   NA 142 08/18/2015   K 4.2 08/18/2015   CL 104 08/18/2015   CREATININE 0.84 08/18/2015   BUN 23 (H) 08/18/2015   CO2 19 08/18/2015   TSH 4.620 (H) 07/17/2015    --------------------------------------------------------------------------------------------------------------------- Dg C-arm 1-60 Min-no Report  Result Date: 07/26/2016 Fluoroscopy was   utilized by the requesting physician.  No radiographic interpretation.     Assessment & Plan:   Megan Brock was seen today for back pain.  Diagnoses and all orders for this visit:  Lumbar spondylosis -     Lumbar Epidural Injection; Future  Left sided sciatica -     Lumbar Epidural Injection; Future  Chronic pain syndrome -     Lumbar Epidural Injection; Future  Chronic bilateral low back pain with left-sided sciatica  Other orders -     Discontinue: morphine (MS CONTIN) 60 MG 12 hr tablet; Take 1 tablet (60 mg total)  by mouth 3 (three) times daily. -     morphine (MS CONTIN) 60 MG 12 hr tablet; Take 1 tablet (60 mg total) by mouth 3 (three) times daily. -     Discontinue: HYDROcodone-acetaminophen (NORCO) 10-325 MG tablet; Take 1 tablet by mouth every 6 (six) hours as needed. -     HYDROcodone-acetaminophen (NORCO) 10-325 MG tablet; Take 1 tablet by mouth every 6 (six) hours as needed. -     naloxone (NARCAN) nasal spray 4 mg/0.1 mL; For excess sedation from opioids. -     cyclobenzaprine (FLEXERIL) 10 MG tablet; Take 1 tablet (10 mg total) by mouth at bedtime.        ----------------------------------------------------------------------------------------------------------------------  Problem List Items Addressed This Visit      Musculoskeletal and Integument   Lumbar spondylosis - Primary (Chronic)   Relevant Medications   morphine (MS CONTIN) 60 MG 12 hr tablet   HYDROcodone-acetaminophen (NORCO) 10-325 MG tablet   cyclobenzaprine (FLEXERIL) 10 MG tablet   Other Relevant Orders   Lumbar Epidural Injection     Other   Chronic lower back pain   Relevant Medications   morphine (MS CONTIN) 60 MG 12 hr tablet   HYDROcodone-acetaminophen (NORCO) 10-325 MG tablet   cyclobenzaprine (FLEXERIL) 10 MG tablet   Chronic pain syndrome (Chronic)   Relevant Orders   Lumbar Epidural Injection    Other Visit Diagnoses    Left sided sciatica       Relevant Medications   cyclobenzaprine (FLEXERIL) 10 MG tablet   Other Relevant Orders   Lumbar Epidural Injection        ----------------------------------------------------------------------------------------------------------------------  1. Lumbar spondylosis Continue back stretching strengthening exercises - Lumbar Epidural Injection; Future  2. Left sided sciatica Return to clinic in 2 months for reevaluation and we will schedule her for a left side lumbar epidural steroid injection to see if we can help with the sciatica on that side. She has  responded favorably to the previous injection we did back in April. - Lumbar Epidural Injection; Future  3. Chronic pain syndrome Meanwhile we will refill her medications for MS Contin and hydrocodone 10 mg tablets. These are for August 24 and September 23. We have reviewed to [place] practitioner database information and it is appropriate. - Lumbar Epidural Injection; Future  4. Chronic bilateral low back pain with left-sided sciatica     ----------------------------------------------------------------------------------------------------------------------  I have changed Megan Brock's cyclobenzaprine. I am also having her start on naloxone. Additionally, I am having her maintain her lisinopril, FLUVIRIN, cyclobenzaprine, lisinopril, cyclobenzaprine, morphine, and HYDROcodone-acetaminophen.   Meds ordered this encounter  Medications  . DISCONTD: morphine (MS CONTIN) 60 MG 12 hr tablet    Sig: Take 1 tablet (60 mg total) by mouth 3 (three) times daily.    Dispense:  75 tablet    Refill:  0    Do not fill until [id number]  .   morphine (MS CONTIN) 60 MG 12 hr tablet    Sig: Take 1 tablet (60 mg total) by mouth 3 (three) times daily.    Dispense:  75 tablet    Refill:  0    Do not fill until [id number]  . DISCONTD: HYDROcodone-acetaminophen (NORCO) 10-325 MG tablet    Sig: Take 1 tablet by mouth every 6 (six) hours as needed.    Dispense:  45 tablet    Refill:  0    Do not fill until [id number]  . HYDROcodone-acetaminophen (NORCO) 10-325 MG tablet    Sig: Take 1 tablet by mouth every 6 (six) hours as needed.    Dispense:  45 tablet    Refill:  0    Do not fill until [id number]  . naloxone (NARCAN) nasal spray 4 mg/0.1 mL    Sig: For excess sedation from opioids.    Dispense:  1 kit    Refill:  2  . cyclobenzaprine (FLEXERIL) 10 MG tablet    Sig: Take 1 tablet (10 mg total) by mouth at bedtime.    Dispense:  30 tablet    Refill:  3   Patient's Medications  New Prescriptions    NALOXONE (NARCAN) NASAL SPRAY 4 MG/0.1 ML    For excess sedation from opioids.  Previous Medications   CYCLOBENZAPRINE (FLEXERIL) 10 MG TABLET    Take 10 mg by mouth.   CYCLOBENZAPRINE (FLEXERIL) 10 MG TABLET    Take 1 tablet (10 mg total) by mouth at bedtime.   FLUVIRIN 0.5 ML SUSY    ADM 0.5ML IM UTD   LISINOPRIL (PRINIVIL,ZESTRIL) 10 MG TABLET    Take 1 tablet (10 mg total) by mouth daily.   LISINOPRIL (PRINIVIL,ZESTRIL) 10 MG TABLET    Take 10 mg by mouth.  Modified Medications   Modified Medication Previous Medication   CYCLOBENZAPRINE (FLEXERIL) 10 MG TABLET cyclobenzaprine (FLEXERIL) 10 MG tablet      Take 1 tablet (10 mg total) by mouth at bedtime.    TAKE 1 TABLET FOR PAIN EVERY NIGHT AT BEDTIME AS NEEDED   HYDROCODONE-ACETAMINOPHEN (NORCO) 10-325 MG TABLET HYDROcodone-acetaminophen (NORCO) 10-325 MG tablet      Take 1 tablet by mouth every 6 (six) hours as needed.    Take 1 tablet by mouth every 6 (six) hours as needed.   MORPHINE (MS CONTIN) 60 MG 12 HR TABLET morphine (MS CONTIN) 60 MG 12 hr tablet      Take 1 tablet (60 mg total) by mouth 3 (three) times daily.    Take 1 tablet (60 mg total) by mouth 3 (three) times daily.  Discontinued Medications   No medications on file   ----------------------------------------------------------------------------------------------------------------------  Follow-up: Return in about 2 months (around 02/06/2017) for procedure.  Greater than 50% of this encounter was designed for patient management and a course of therapy.   Molli Barrows, MD

## 2016-12-07 NOTE — Patient Instructions (Signed)
Preparing for Procedure with Sedation Instructions: . Oral Intake: Do not eat or drink anything for at least 8 hours prior to your procedure. . Transportation: Public transportation is not allowed. Bring an adult driver. The driver must be physically present in our waiting room before any procedure can be started. Marland Kitchen Physical Assistance: Bring an adult capable of physically assisting you, in the event you need help. . Blood Pressure Medicine: Take your blood pressure medicine with a sip of water the morning of the procedure. . Insulin: Take only  of your normal insulin dose. . Preventing infections: Shower with an antibacterial soap the morning of your procedure. . Build-up your immune system: Take 1000 mg of Vitamin C with every meal (3 times a day) the day prior to your procedure. . Pregnancy: If you are pregnant, call and cancel the procedure. . Sickness: If you have a cold, fever, or any active infections, call and cancel the procedure. . Arrival: You must be in the facility at least 30 minutes prior to your scheduled procedure. . Children: Do not bring children with you. . Dress appropriately: Bring dark clothing that you would not mind if they get stained. . Valuables: Do not bring any jewelry or valuables. Procedure appointments are reserved for interventional treatments only. Marland Kitchen No Prescription Refills. . No medication changes will be discussed during procedure appointments. No disability issues will be discussed.Epidural Steroid Injection Patient Information  Description: The epidural space surrounds the nerves as they exit the spinal cord.  In some patients, the nerves can be compressed and inflamed by a bulging disc or a tight spinal canal (spinal stenosis).  By injecting steroids into the epidural space, we can bring irritated nerves into direct contact with a potentially helpful medication.  These steroids act directly on the irritated nerves and can reduce swelling and inflammation  which often leads to decreased pain.  Epidural steroids may be injected anywhere along the spine and from the neck to the low back depending upon the location of your pain.   After numbing the skin with local anesthetic (like Novocaine), a small needle is passed into the epidural space slowly.  You may experience a sensation of pressure while this is being done.  The entire block usually last less than 10 minutes.  Conditions which may be treated by epidural steroids:   Low back and leg pain  Neck and arm pain  Spinal stenosis  Post-laminectomy syndrome  Herpes zoster (shingles) pain  Pain from compression fractures  Preparation for the injection:  1. Do not eat any solid food or dairy products within 8 hours of your appointment.  2. You may drink clear liquids up to 3 hours before appointment.  Clear liquids include water, black coffee, juice or soda.  No milk or cream please. 3. You may take your regular medication, including pain medications, with a sip of water before your appointment  Diabetics should hold regular insulin (if taken separately) and take 1/2 normal NPH dos the morning of the procedure.  Carry some sugar containing items with you to your appointment. 4. A driver must accompany you and be prepared to drive you home after your procedure.  5. Bring all your current medications with your. 6. An IV may be inserted and sedation may be given at the discretion of the physician.   7. A blood pressure cuff, EKG and other monitors will often be applied during the procedure.  Some patients may need to have extra oxygen administered for a  short period. 8. You will be asked to provide medical information, including your allergies, prior to the procedure.  We must know immediately if you are taking blood thinners (like Coumadin/Warfarin)  Or if you are allergic to IV iodine contrast (dye). We must know if you could possible be pregnant.  Possible side-effects:  Bleeding from needle  site  Infection (rare, may require surgery)  Nerve injury (rare)  Numbness & tingling (temporary)  Difficulty urinating (rare, temporary)  Spinal headache ( a headache worse with upright posture)  Light -headedness (temporary)  Pain at injection site (several days)  Decreased blood pressure (temporary)  Weakness in arm/leg (temporary)  Pressure sensation in back/neck (temporary)  Call if you experience:  Fever/chills associated with headache or increased back/neck pain.  Headache worsened by an upright position.  New onset weakness or numbness of an extremity below the injection site  Hives or difficulty breathing (go to the emergency room)  Inflammation or drainage at the infection site  Severe back/neck pain  Any new symptoms which are concerning to you  Please note:  Although the local anesthetic injected can often make your back or neck feel good for several hours after the injection, the pain will likely return.  It takes 3-7 days for steroids to work in the epidural space.  You may not notice any pain relief for at least that one week.  If effective, we will often do a series of three injections spaced 3-6 weeks apart to maximally decrease your pain.  After the initial series, we generally will wait several months before considering a repeat injection of the same type.  If you have any questions, please call [phone] [facility]

## 2016-12-07 NOTE — Progress Notes (Signed)
Nursing Pain Medication Assessment:  Safety precautions to be maintained throughout the outpatient stay will include: orient to surroundings, keep bed in low position, maintain call bell within reach at all times, provide assistance with transfer out of bed and ambulation.  Medication Inspection Compliance: Pill count conducted under aseptic conditions, in front of the patient. Neither the pills nor the bottle was removed from the patient's sight at any time. Once count was completed pills were immediately returned to the patient in their original bottle.  Medication #1: Morphine IR Pill/Patch Count: 10 of 75 pills remain Pill/Patch Appearance: Markings consistent with prescribed medication Bottle Appearance: Standard pharmacy container. Clearly labeled. Filled Date: 7 / 25 / 2018 Last Medication intake:  Today  Medication #2: Hydrocodone/APAP Pill/Patch Count: 4 of 45 pills remain Pill/Patch Appearance: Markings consistent with prescribed medication Bottle Appearance: Standard pharmacy container. Clearly labeled. Filled Date: 7 / 25 / 2018 Last Medication intake:  Today

## 2016-12-08 ENCOUNTER — Ambulatory Visit (INDEPENDENT_AMBULATORY_CARE_PROVIDER_SITE_OTHER): Payer: Medicare Other | Admitting: Family Medicine

## 2016-12-08 ENCOUNTER — Encounter: Payer: Self-pay | Admitting: Family Medicine

## 2016-12-08 VITALS — BP 138/82 | HR 99 | Temp 98.4°F | Resp 16 | Wt 176.2 lb

## 2016-12-08 DIAGNOSIS — Q612 Polycystic kidney, adult type: Secondary | ICD-10-CM

## 2016-12-08 DIAGNOSIS — Z23 Encounter for immunization: Secondary | ICD-10-CM | POA: Diagnosis not present

## 2016-12-08 DIAGNOSIS — R030 Elevated blood-pressure reading, without diagnosis of hypertension: Secondary | ICD-10-CM | POA: Diagnosis not present

## 2016-12-08 DIAGNOSIS — I10 Essential (primary) hypertension: Secondary | ICD-10-CM | POA: Insufficient documentation

## 2016-12-08 MED ORDER — LISINOPRIL 10 MG PO TABS
10.0000 mg | ORAL_TABLET | Freq: Every day | ORAL | 5 refills | Status: DC
Start: 2016-12-08 — End: 2016-12-28

## 2016-12-08 NOTE — Progress Notes (Signed)
Patient: Megan Brock Female    DOB: 28-Jan-1981   35 y.o.   MRN: [id number] Visit Date: 12/08/2016  Today's Provider: Mila Merry, MD   Chief Complaint  Patient presents with  . Hypertension  . Hypothyroidism  . Follow-up   Subjective:    HPI  Hypertension, follow-up:  BP Readings from Last 3 Encounters:  12/08/16 138/82  12/07/16 134/78  10/11/16 134/84    She was last seen for hypertension on 11/25/2015 BP at that visit was 110/70. Management changes since that visit include continue Lisinopril 10 mg. Dose was increased on 08/18/2015. She reports good compliance with treatment. She is not having side effects.  She is exercising lightly. She is walking. She is adherent to low salt diet.   Outside blood pressures are being checked at other provider offices. She is experiencing none.  Patient denies chest pain, chest pressure/discomfort, irregular heart beat and palpitations.   Cardiovascular risk factors include none.      Weight trend: stable Wt Readings from Last 3 Encounters:  12/08/16 176 lb 3.2 oz (79.9 kg)  12/07/16 160 lb (72.6 kg)  07/26/16 160 lb (72.6 kg)    ------------------------------------------------------------------------  Hypothyroid, follow-up:  TSH  Date Value Ref Range Status  01/14/2016 1.60 0.41 - 5.90 Final  07/17/2015 4.620 (H) 0.450 - 4.500 uIU/mL Final  11/03/2011 4.99 0.41 - 5.90 uIU/mL Final   Wt Readings from Last 3 Encounters:  12/08/16 176 lb 3.2 oz (79.9 kg)  12/07/16 160 lb (72.6 kg)  07/26/16 160 lb (72.6 kg)    She was last seen for hypothyroid on 07/16/2015 Management since that visit includes no change. Labs were stable. She is exercising lightly. She is walking some. She is experiencing none She denies change in energy level, diarrhea, heat / cold intolerance, nervousness, palpitations and weight changes Weight trend: fluctuating a  bit  ------------------------------------------------------------------------    Previous Medications   CYCLOBENZAPRINE (FLEXERIL) 10 MG TABLET    Take 10 mg by mouth.   CYCLOBENZAPRINE (FLEXERIL) 10 MG TABLET    Take 1 tablet (10 mg total) by mouth at bedtime.   CYCLOBENZAPRINE (FLEXERIL) 10 MG TABLET    Take 1 tablet (10 mg total) by mouth at bedtime.   FLUVIRIN 0.5 ML SUSY    ADM 0.5ML IM UTD   HYDROCODONE-ACETAMINOPHEN (NORCO) 10-325 MG TABLET    Take 1 tablet by mouth every 6 (six) hours as needed.   LISINOPRIL (PRINIVIL,ZESTRIL) 10 MG TABLET    Take 1 tablet (10 mg total) by mouth daily.   LISINOPRIL (PRINIVIL,ZESTRIL) 10 MG TABLET    Take 10 mg by mouth.   MORPHINE (MS CONTIN) 60 MG 12 HR TABLET    Take 1 tablet (60 mg total) by mouth 3 (three) times daily.   NALOXONE (NARCAN) NASAL SPRAY 4 MG/0.1 ML    For excess sedation from opioids.    Review of Systems  Constitutional: Negative.   Respiratory: Negative.   Cardiovascular: Negative.   Endocrine: Negative.     Social History  Substance Use Topics  . Smoking status: Current Every Day Smoker    Packs/day: 1.00    Types: Cigarettes  . Smokeless tobacco: Never Used  . Alcohol use No   Objective:   BP 138/82 (BP Location: Right Arm, Patient Position: Sitting, Cuff Size: Normal)   Pulse 99   Temp 98.4 F (36.9 C) (Oral)   Resp 16   Wt 176 lb 3.2 oz (79.9 kg)   SpO2 98%  BMI 30.24 kg/m   Physical Exam   General Appearance:    Alert, cooperative, no distress  Eyes:    PERRL, conjunctiva/corneas clear, EOM's intact       Lungs:     Clear to auscultation bilaterally, respirations unlabored  Heart:    Regular rate and rhythm  Neurologic:   Awake, alert, oriented x 3. No apparent focal neurological           defect.           Assessment & Plan:     1. Polycystic kidney, autosomal dominant Continue periodic follow up with urology. Doing well with lisinpril  2. Elevated blood pressure reading Labile BP but  staying below 140/90 in the office.   3. Need for influenza vaccination  - Flu Vaccine QUAD 36+ mos IM (Fluarix & Fluzone Quad PF  Counseled on importance of routine cervical cancer screening and she reports she has not had pap test in several years. She agreed to schedule.    Follow up: No Follow-up on file.

## 2016-12-13 ENCOUNTER — Encounter: Payer: Medicare Other | Admitting: Anesthesiology

## 2016-12-27 DIAGNOSIS — F339 Major depressive disorder, recurrent, unspecified: Secondary | ICD-10-CM | POA: Diagnosis not present

## 2016-12-28 ENCOUNTER — Other Ambulatory Visit: Payer: Self-pay | Admitting: Family Medicine

## 2017-01-06 ENCOUNTER — Encounter: Payer: Self-pay | Admitting: Family Medicine

## 2017-01-06 ENCOUNTER — Ambulatory Visit (INDEPENDENT_AMBULATORY_CARE_PROVIDER_SITE_OTHER): Payer: Medicare Other | Admitting: Family Medicine

## 2017-01-06 VITALS — BP 122/80 | HR 110 | Temp 98.9°F | Resp 16 | Ht 64.0 in | Wt 171.0 lb

## 2017-01-06 DIAGNOSIS — J40 Bronchitis, not specified as acute or chronic: Secondary | ICD-10-CM

## 2017-01-06 DIAGNOSIS — Q613 Polycystic kidney, unspecified: Secondary | ICD-10-CM

## 2017-01-06 DIAGNOSIS — Z6829 Body mass index (BMI) 29.0-29.9, adult: Secondary | ICD-10-CM

## 2017-01-06 DIAGNOSIS — R030 Elevated blood-pressure reading, without diagnosis of hypertension: Secondary | ICD-10-CM | POA: Diagnosis not present

## 2017-01-06 DIAGNOSIS — Z720 Tobacco use: Secondary | ICD-10-CM | POA: Diagnosis not present

## 2017-01-06 DIAGNOSIS — E039 Hypothyroidism, unspecified: Secondary | ICD-10-CM | POA: Diagnosis not present

## 2017-01-06 DIAGNOSIS — Z124 Encounter for screening for malignant neoplasm of cervix: Secondary | ICD-10-CM | POA: Diagnosis not present

## 2017-01-06 LAB — COMPLETE METABOLIC PANEL WITH GFR
AG RATIO: 1.6 (calc) (ref 1.0–2.5)
ALBUMIN MSPROF: 4.5 g/dL (ref 3.6–5.1)
ALT: 52 U/L — ABNORMAL HIGH (ref 6–29)
AST: 43 U/L — AB (ref 10–30)
Alkaline phosphatase (APISO): 81 U/L (ref 33–115)
BUN: 16 mg/dL (ref 7–25)
CALCIUM: 9.3 mg/dL (ref 8.6–10.2)
CO2: 23 mmol/L (ref 20–32)
Chloride: 107 mmol/L (ref 98–110)
Creat: 0.77 mg/dL (ref 0.50–1.10)
GFR, EST AFRICAN AMERICAN: 117 mL/min/{1.73_m2} (ref >=60)
GFR, EST NON AFRICAN AMERICAN: 101 mL/min/{1.73_m2} (ref >=60)
GLOBULIN: 2.9 g/dL (ref 1.9–3.7)
Glucose, Bld: 104 mg/dL — ABNORMAL HIGH (ref 65–99)
POTASSIUM: 4.2 mmol/L (ref 3.5–5.3)
SODIUM: 138 mmol/L (ref 135–146)
Total Bilirubin: 0.6 mg/dL (ref 0.2–1.2)
Total Protein: 7.4 g/dL (ref 6.1–8.1)

## 2017-01-06 LAB — SPECIMEN ID NOTIFICATION MISSING 2ND ID

## 2017-01-06 LAB — TSH: TSH: 1.27 mIU/L

## 2017-01-06 MED ORDER — AZITHROMYCIN 250 MG PO TABS: ORAL_TABLET | ORAL | 0 refills | Status: AC

## 2017-01-06 MED ORDER — BUPROPION HCL ER (SR) 150 MG PO TB12: ORAL_TABLET | ORAL | 5 refills | Status: DC

## 2017-01-06 NOTE — Progress Notes (Addendum)
Patient: Megan Brock, Female    DOB: 1981/03/03, 36 y.o.   MRN: [id number] Visit Date: 01/06/2017  Today's Provider: Lelon Huh, MD   Chief Complaint  Patient presents with  . Gynecologic Exam  . Hypothyroidism  . Hypertension  . Cough   Subjective:    Annual exam Megan Brock is a 36 y.o. female who presents today for health maintenance and pap/pelvic/breast exam. She feels well. She reports exercising occasionally. She reports she is sleeping poorly. However, she reports that Flexeril helps with her sleep.      Hypothyroid, follow-up:  TSH  Date Value Ref Range Status  01/14/2016 1.60 0.41 - 5.90 Final  07/17/2015 4.620 (H) 0.450 - 4.500 uIU/mL Final  11/03/2011 4.99 0.41 - 5.90 uIU/mL Final   Wt Readings from Last 3 Encounters:  01/06/17 171 lb (77.6 kg)  12/08/16 176 lb 3.2 oz (79.9 kg)  12/07/16 160 lb (72.6 kg)    She was last seen for hypothyroid  1 yr ago.  Management since that visit includes none. She reports good compliance with treatment. She is not having side effects.  She is exercising. She is experiencing none She denies heat / cold intolerance, nervousness and weight changes Weight trend: stable    Elevated Blood Pressure, follow-up:  BP Readings from Last 3 Encounters:  01/06/17 122/80  12/08/16 138/82  12/07/16 134/78    She was last seen for hypertension 4 weeks ago.  BP at that visit was 138/82. Management since that visit includes none. She reports good compliance with treatment. She is not having side effects.  She is exercising. She is adherent to low salt diet.   Outside blood pressures are checked occasionally. She is experiencing none.  Patient denies chest pain, exertional chest pressure/discomfort, lower extremity edema and palpitations.    Weight trend: stable Wt Readings from Last 3 Encounters:  01/06/17 171 lb (77.6 kg)  12/08/16 176 lb 3.2 oz (79.9 kg)  12/07/16 160 lb (72.6 kg)    Current  diet: well balanced  She also reports she has had worsening cough for the last 2 weeks, productive clear sputum. Mild nasal drainage. No fevers, chills, sweats or dyspnea.    Review of Systems  Constitutional: Positive for fatigue.  HENT: Negative.   Eyes: Negative.   Respiratory: Negative.   Cardiovascular: Negative.   Gastrointestinal: Negative.   Endocrine: Negative.   Genitourinary: Negative.   Musculoskeletal: Negative.   Skin: Negative.   Allergic/Immunologic: Negative.   Neurological: Negative.   Hematological: Negative.   Psychiatric/Behavioral: Negative.     Social History      She  reports that she has been smoking Cigarettes.  She has been smoking about 1.00 pack per day. She has never used smokeless tobacco. She reports that she does not drink alcohol or use drugs.       Social History   Social History  . Marital status: Married    Spouse name: N/A  . Number of children: N/A  . Years of education: N/A   Occupational History  .      Disabled   Social History Main Topics  . Smoking status: Current Every Day Smoker    Packs/day: 1.00    Types: Cigarettes  . Smokeless tobacco: Never Used  . Alcohol use No  . Drug use: No  . Sexual activity: Not Asked   Other Topics Concern  . None   Social History Narrative  . None  Past Medical History:  Diagnosis Date  . History of chicken pox      Patient Active Problem List   Diagnosis Date Noted  . Elevated blood pressure reading 12/08/2016  . Long term current use of opiate analgesic 10/11/2016  . Chronic pain syndrome 10/11/2016  . Lumbar spondylosis 10/11/2016  . Kidney stone 07/16/2015  . Hypothyroidism 07/16/2015  . Anxiety 07/16/2015  . Hepatitis   . Polycystic kidney disease 09/18/2014  . Chronic lower back pain 09/18/2014  . Chronic, continuous use of opioids 09/18/2014  . Polycystic kidney, autosomal dominant 08/03/2012  . Disorder of calcium metabolism 11/12/2011  . Malaise and fatigue  11/03/2011  . Gross hematuria 12/25/2007    Past Surgical History:  Procedure Laterality Date  . CESAREAN SECTION    . TUBAL LIGATION      Family History        Family Status  Relation Status  . Mother Alive  . Father Deceased at age 61       cause of death: MI  . Brother Alive        Her family history includes Arthritis in her father and mother; Cancer in her father; Early death in her father; Heart attack in her father; Heart disease in her father; Hypertension in her brother, father, and mother; Kidney disease in her father.     No Known Allergies   Current Outpatient Prescriptions:  .  cyclobenzaprine (FLEXERIL) 10 MG tablet, Take 1 tablet (10 mg total) by mouth at bedtime., Disp: 30 tablet, Rfl: 3 .  HYDROcodone-acetaminophen (NORCO) 10-325 MG tablet, Take 1 tablet by mouth every 6 (six) hours as needed., Disp: 45 tablet, Rfl: 0 .  lisinopril (PRINIVIL,ZESTRIL) 10 MG tablet, TAKE 1 TABLET(10 MG) BY MOUTH DAILY, Disp: 90 tablet, Rfl: 4 .  morphine (MS CONTIN) 60 MG 12 hr tablet, Take 1 tablet (60 mg total) by mouth 3 (three) times daily., Disp: 75 tablet, Rfl: 0 .  naloxone (NARCAN) nasal spray 4 mg/0.1 mL, For excess sedation from opioids., Disp: 1 kit, Rfl: 2 .  cyclobenzaprine (FLEXERIL) 10 MG tablet, Take 10 mg by mouth., Disp: , Rfl:  .  FLUVIRIN 0.5 ML SUSY, ADM 0.5ML IM UTD, Disp: , Rfl: 0   Patient Care Team: Birdie Sons, MD as PCP - General (Family Medicine) Murrell Redden, MD (Urology) Molli Barrows, MD as Consulting Physician (Anesthesiology)      Objective:   Vitals: BP 122/80 (BP Location: Left Arm, Patient Position: Sitting, Cuff Size: Normal)   Pulse (!) 110   Temp 98.9 F (37.2 C)   Resp 16   Ht _0  (1.626 m)   Wt 171 lb (77.6 kg)   LMP 12/06/2016 (Approximate)   SpO2 99%   BMI 29.35 kg/m    Vitals:   01/06/17 1022  BP: 122/80  Pulse: (!) 110  Resp: 16  Temp: 98.9 F (37.2 C)  SpO2: 99%  Weight: 171 lb (77.6 kg)  Height: _1   (1.626 m)     Physical Exam  Constitutional: She appears well-developed and well-nourished.  HENT:  Head: Normocephalic and atraumatic.  Right Ear: External ear normal.  Left Ear: External ear normal.  Nose: Nose normal.  Mouth/Throat: Oropharynx is clear and moist.  Eyes: Pupils are equal, round, and reactive to light. Conjunctivae and EOM are normal.  Neck: Normal range of motion. Neck supple.  Cardiovascular: Normal rate, regular rhythm and normal heart sounds.   Pulmonary/Chest: Effort normal. She has wheezes.  Right breast exhibits no inverted nipple and no nipple discharge. Left breast exhibits no inverted nipple and no nipple discharge.  Abdominal: Soft. Bowel sounds are normal.  Genitourinary: Vagina normal. Cervix exhibits no discharge.     Depression Screen PHQ 2/9 Scores 12/07/2016 10/11/2016 06/02/2016 04/14/2016  PHQ - 2 Score 0 0 0 0      Assessment & Plan:   1. Cervical cancer screening  - Pap IG and HPV (high risk) DNA detection  2. Tobacco abuse Stop smoking counseling.  - buPROPion (WELLBUTRIN SR) 150 MG 12 hr tablet; 1 tablet daily for 3 days, then 1 tablet twice daily. Stop smoking 14 days after starting medication  Dispense: 60 tablet; Refill: 5  3. Bronchitis  - azithromycin (ZITHROMAX) 250 MG tablet; 2 by mouth today, then 1 daily for 4 days  Dispense: 6 tablet; Refill: 0  4. Polycystic kidney disease  - COMPLETE METABOLIC PANEL WITH GFR  5. Hypothyroidism, unspecified type  - TSH - COMPLETE METABOLIC PANEL WITH GFR  6. Elevated blood pressure reading Much better on lisinopril  7. BMI=29.0-29.9 Encourage regular exercise up to 129m per week.   DLelon Huh MD  [facility]

## 2017-01-07 LAB — PAP IG AND HPV HIGH-RISK: HPV DNA High Risk: NOT DETECTED

## 2017-02-01 ENCOUNTER — Encounter: Payer: Self-pay | Admitting: Anesthesiology

## 2017-02-01 ENCOUNTER — Ambulatory Visit
Admission: RE | Admit: 2017-02-01 | Discharge: 2017-02-01 | Disposition: A | Discharge status: Home / Self Care | Payer: Medicare Other | Source: Ambulatory Visit | Attending: Anesthesiology | Admitting: Anesthesiology

## 2017-02-01 ENCOUNTER — Ambulatory Visit (HOSPITAL_BASED_OUTPATIENT_CLINIC_OR_DEPARTMENT_OTHER): Payer: Medicare Other | Admitting: Anesthesiology

## 2017-02-01 ENCOUNTER — Other Ambulatory Visit: Payer: Self-pay | Admitting: Anesthesiology

## 2017-02-01 VITALS — BP 112/64 | HR 102 | Temp 98.1°F | Resp 18 | Ht 64.0 in | Wt 165.0 lb

## 2017-02-01 DIAGNOSIS — M5431 Sciatica, right side: Secondary | ICD-10-CM | POA: Insufficient documentation

## 2017-02-01 DIAGNOSIS — G8929 Other chronic pain: Secondary | ICD-10-CM

## 2017-02-01 DIAGNOSIS — M5432 Sciatica, left side: Secondary | ICD-10-CM | POA: Diagnosis not present

## 2017-02-01 DIAGNOSIS — M5441 Lumbago with sciatica, right side: Secondary | ICD-10-CM

## 2017-02-01 DIAGNOSIS — M47816 Spondylosis without myelopathy or radiculopathy, lumbar region: Secondary | ICD-10-CM | POA: Diagnosis not present

## 2017-02-01 DIAGNOSIS — M51369 Other intervertebral disc degeneration, lumbar region without mention of lumbar back pain or lower extremity pain: Secondary | ICD-10-CM

## 2017-02-01 DIAGNOSIS — R52 Pain, unspecified: Secondary | ICD-10-CM

## 2017-02-01 DIAGNOSIS — G894 Chronic pain syndrome: Secondary | ICD-10-CM

## 2017-02-01 DIAGNOSIS — M5136 Other intervertebral disc degeneration, lumbar region: Secondary | ICD-10-CM

## 2017-02-01 DIAGNOSIS — Z79891 Long term (current) use of opiate analgesic: Secondary | ICD-10-CM

## 2017-02-01 MED ORDER — IOPAMIDOL (ISOVUE-M 200) INJECTION 41%
INTRAMUSCULAR | Status: AC
  Filled 2017-02-01: qty 10

## 2017-02-01 MED ORDER — SODIUM CHLORIDE 0.9 % IJ SOLN
INTRAMUSCULAR | Status: AC
  Filled 2017-02-01: qty 10

## 2017-02-01 MED ORDER — ROPIVACAINE HCL 2 MG/ML IJ SOLN
10.0000 mL | Freq: Once | INTRAMUSCULAR | Status: AC
  Administered 2017-02-01: 1 mL via EPIDURAL
  Filled 2017-02-01: qty 10

## 2017-02-01 MED ORDER — IOPAMIDOL (ISOVUE-M 200) INJECTION 41%
20.0000 mL | Freq: Once | INTRAMUSCULAR | Status: DC | PRN
  Administered 2017-02-01: 10 mL
  Filled 2017-02-01: qty 20

## 2017-02-01 MED ORDER — LIDOCAINE HCL (PF) 1 % IJ SOLN
5.0000 mL | Freq: Once | INTRAMUSCULAR | Status: AC
  Administered 2017-02-01: 5 mL via SUBCUTANEOUS
  Filled 2017-02-01: qty 5

## 2017-02-01 MED ORDER — HYDROCODONE-ACETAMINOPHEN 10-325 MG PO TABS: 1.0000 | ORAL_TABLET | Freq: Four times a day (QID) | ORAL | 0 refills | Status: DC | PRN

## 2017-02-01 MED ORDER — MORPHINE SULFATE ER 60 MG PO T12A: 1.0000 | EXTENDED_RELEASE_TABLET | Freq: Three times a day (TID) | ORAL | 0 refills | Status: DC

## 2017-02-01 MED ORDER — SODIUM CHLORIDE 0.9% FLUSH
10.0000 mL | Freq: Once | INTRAVENOUS | Status: AC
  Administered 2017-02-01: 10 mL

## 2017-02-01 MED ORDER — TRIAMCINOLONE ACETONIDE 40 MG/ML IJ SUSP
40.0000 mg | Freq: Once | INTRAMUSCULAR | Status: AC
  Administered 2017-02-01: 40 mg
  Filled 2017-02-01: qty 1

## 2017-02-01 NOTE — Progress Notes (Signed)
Subjective:  Patient ID: Megan Brock, female    DOB: 1980-09-26  Age: 36 y.o. MRN: [id number]  CC: Back Pain (low)   Procedure: L4-5 epidural steroid under fluoroscopic guidance without sedation   HPI Megan Brock presents for reevaluation. She was last seen back in August. Prior that she was seen back in April at which point she had an epidural steroid injection. Preceding that is been several months prior to any other injection therapy. At this point she's describing diminished left leg pain but some right sided lower leg pain with right calf pain and cramping. She has had persistent low back pain of a chronic nature for which she receives opioid management. She follows Dr. cope for her polycystic kidney disease. Based on her narcotic assessment sheet she continues to derive good lifestyle functional improvement with her medications. Otherwise she is experiencing right side greater than left lower extremity pain and remarks that her left leg pain is much better. No change in lower extremity strength or function are noted.  Outpatient Medications Prior to Visit  Medication Sig Dispense Refill  . buPROPion (WELLBUTRIN SR) 150 MG 12 hr tablet 1 tablet daily for 3 days, then 1 tablet twice daily. Stop smoking 14 days after starting medication 60 tablet 5  . cyclobenzaprine (FLEXERIL) 10 MG tablet Take 1 tablet (10 mg total) by mouth at bedtime. 30 tablet 3  . lisinopril (PRINIVIL,ZESTRIL) 10 MG tablet TAKE 1 TABLET(10 MG) BY MOUTH DAILY 90 tablet 4  . naloxone (NARCAN) nasal spray 4 mg/0.1 mL For excess sedation from opioids. 1 kit 2  . cyclobenzaprine (FLEXERIL) 10 MG tablet Take 10 mg by mouth.    Marland Kitchen FLUVIRIN 0.5 ML SUSY ADM 0.5ML IM UTD  0   No facility-administered medications prior to visit.     Review of Systems CNS: No sedation or confusion Cardiac: No angina or palpitations GI: No constipation or abdominal pain GU: Chronic flank pain  Objective:  BP 112/64   Pulse (!) 102    Temp 98.1 F (36.7 C)   Resp 18   Ht 5' 4"  (1.626 m)   Wt 165 lb (74.8 kg)   LMP 01/18/2017   SpO2 100%   BMI 28.32 kg/m    BP Readings from Last 3 Encounters:  02/01/17 112/64  01/06/17 122/80  12/08/16 138/82     Wt Readings from Last 3 Encounters:  02/01/17 165 lb (74.8 kg)  01/06/17 171 lb (77.6 kg)  12/08/16 176 lb 3.2 oz (79.9 kg)     Physical Exam Pt is alert and oriented PERRL EOMI HEART IS RRR no murmur or rub LCTA no wheezing or rhales MUSCULOSKELETAL reveals chronic flank pain bilaterally. She has a positive straight leg raise on the right side negative on the left she has some paraspinous muscle tenderness but no overt trigger points in the low back. Her muscle tone and bulk is at baseline.  Labs  No results found for: HGBA1C Lab Results  Component Value Date   LDLCALC 105 (H) 07/17/2015   CREATININE 0.77 01/06/2017    -------------------------------------------------------------------------------------------------------------------- Lab Results  Component Value Date   GLUCOSE 104 (H) 01/06/2017   CHOL 174 07/17/2015   TRIG 139 07/17/2015   HDL 41 07/17/2015   LDLCALC 105 (H) 07/17/2015   ALT 52 (H) 01/06/2017   AST 43 (H) 01/06/2017   NA 138 01/06/2017   K 4.2 01/06/2017   CL 107 01/06/2017   CREATININE 0.77 01/06/2017   BUN 16 01/06/2017  CO2 23 01/06/2017   TSH 1.27 01/06/2017    --------------------------------------------------------------------------------------------------------------------- Dg C-arm 1-60 Min-no Report  Result Date: 02/01/2017 Fluoroscopy was utilized by the requesting physician.  No radiographic interpretation.     Assessment & Plan:   Megan Brock was seen today for back pain.  Diagnoses and all orders for this visit:  Lumbar spondylosis -     Lumbar Epidural Injection  Sciatica of right side -     triamcinolone acetonide (KENALOG-40) injection 40 mg; 1 mL (40 mg total) by Other route once. -     sodium  chloride flush (NS) 0.9 % injection 10 mL; 10 mLs by Other route once. -     ropivacaine (PF) 2 mg/mL (0.2%) (NAROPIN) injection 10 mL; 10 mLs by Epidural route once. -     lidocaine (PF) (XYLOCAINE) 1 % injection 5 mL; Inject 5 mLs into the skin once. -     iopamidol (ISOVUE-M) 41 % intrathecal injection 20 mL; 20 mLs by Other route once as needed for contrast. -     Lumbar Epidural Injection; Future  Chronic pain syndrome -     Lumbar Epidural Injection  Long term current use of opiate analgesic  DDD (degenerative disc disease), lumbar  Chronic bilateral low back pain with right-sided sciatica  Left sided sciatica -     Lumbar Epidural Injection -     Lumbar Epidural Injection; Future  Other orders -     Discontinue: Morphine Sulfate ER 60 MG T12A; Take 1 tablet by mouth 3 (three) times daily. -     Morphine Sulfate ER 60 MG T12A; Take 1 tablet by mouth 3 (three) times daily. -     Discontinue: HYDROcodone-acetaminophen (NORCO) 10-325 MG tablet; Take 1 tablet by mouth every 6 (six) hours as needed for severe pain. -     HYDROcodone-acetaminophen (NORCO) 10-325 MG tablet; Take 1 tablet by mouth every 6 (six) hours as needed for severe pain.        ----------------------------------------------------------------------------------------------------------------------  Problem List Items Addressed This Visit      Unprioritized   Chronic lower back pain   Relevant Medications   triamcinolone acetonide (KENALOG-40) injection 40 mg (Completed)   Morphine Sulfate ER 60 MG T12A   HYDROcodone-acetaminophen (NORCO) 10-325 MG tablet   Chronic pain syndrome (Chronic)   Long term current use of opiate analgesic (Chronic)   Lumbar spondylosis - Primary (Chronic)   Relevant Medications   triamcinolone acetonide (KENALOG-40) injection 40 mg (Completed)   Morphine Sulfate ER 60 MG T12A   HYDROcodone-acetaminophen (NORCO) 10-325 MG tablet    Other Visit Diagnoses    Sciatica of right  side       Relevant Medications   triamcinolone acetonide (KENALOG-40) injection 40 mg (Completed)   sodium chloride flush (NS) 0.9 % injection 10 mL (Completed)   ropivacaine (PF) 2 mg/mL (0.2%) (NAROPIN) injection 10 mL (Completed)   lidocaine (PF) (XYLOCAINE) 1 % injection 5 mL (Completed)   iopamidol (ISOVUE-M) 41 % intrathecal injection 20 mL   Other Relevant Orders   Lumbar Epidural Injection   DDD (degenerative disc disease), lumbar       Relevant Medications   triamcinolone acetonide (KENALOG-40) injection 40 mg (Completed)   Morphine Sulfate ER 60 MG T12A   HYDROcodone-acetaminophen (NORCO) 10-325 MG tablet   Left sided sciatica       Relevant Orders   Lumbar Epidural Injection        ----------------------------------------------------------------------------------------------------------------------  1. Lumbar spondylosis We will proceed with a  repeat epidural for her right-sided sciatica. This would be her second epidural injection this year. The risks and benefits once again reviewed all questions answered. We'll have return to clinic in 2 months for reevaluation possible repeat injection at that time. - Lumbar Epidural Injection  2. Sciatica of right side As above continue with back stretching strengthening exercises - triamcinolone acetonide (KENALOG-40) injection 40 mg; 1 mL (40 mg total) by Other route once. - sodium chloride flush (NS) 0.9 % injection 10 mL; 10 mLs by Other route once. - ropivacaine (PF) 2 mg/mL (0.2%) (NAROPIN) injection 10 mL; 10 mLs by Epidural route once. - lidocaine (PF) (XYLOCAINE) 1 % injection 5 mL; Inject 5 mLs into the skin once. - iopamidol (ISOVUE-M) 41 % intrathecal injection 20 mL; 20 mLs by Other route once as needed for contrast. - Lumbar Epidural Injection; Future  3. Chronic pain syndrome We'll refill her medications for October 23 and November 22 for her hydrocodone and morphine - Lumbar Epidural Injection  4. Long term  current use of opiate analgesic We have reviewed the [facility] practitioner database information and it is appropriate.  5. DDD (degenerative disc disease), lumbar As above  6. Chronic bilateral low back pain with right-sided sciatica As above  7. Left sided sciatica Appears to be improved at this time - Lumbar Epidural Injection - Lumbar Epidural Injection; Future    ----------------------------------------------------------------------------------------------------------------------  I am having Megan Brock maintain her FLUVIRIN, cyclobenzaprine, naloxone, cyclobenzaprine, lisinopril, buPROPion, Morphine Sulfate ER, and HYDROcodone-acetaminophen. We administered triamcinolone acetonide, sodium chloride flush, ropivacaine (PF) 2 mg/mL (0.2%), lidocaine (PF), and iopamidol.   Meds ordered this encounter  Medications  . DISCONTD: HYDROcodone-acetaminophen (NORCO) 10-325 MG tablet    Sig: Take 1 tablet by mouth every 6 (six) hours as needed.  Marland Kitchen DISCONTD: Morphine Sulfate ER 60 MG T12A    Sig: Take 1 tablet by mouth 3 (three) times daily.  Marland Kitchen triamcinolone acetonide (KENALOG-40) injection 40 mg  . sodium chloride flush (NS) 0.9 % injection 10 mL  . ropivacaine (PF) 2 mg/mL (0.2%) (NAROPIN) injection 10 mL  . lidocaine (PF) (XYLOCAINE) 1 % injection 5 mL  . iopamidol (ISOVUE-M) 41 % intrathecal injection 20 mL  . DISCONTD: Morphine Sulfate ER 60 MG T12A    Sig: Take 1 tablet by mouth 3 (three) times daily.    Dispense:  75 tablet    Refill:  0    Do not fill until [id number]  . Morphine Sulfate ER 60 MG T12A    Sig: Take 1 tablet by mouth 3 (three) times daily.    Dispense:  75 tablet    Refill:  0    Do not fill until [id number]  . DISCONTD: HYDROcodone-acetaminophen (NORCO) 10-325 MG tablet    Sig: Take 1 tablet by mouth every 6 (six) hours as needed for severe pain.    Dispense:  45 tablet    Refill:  0    Do not fill until [id number]  . HYDROcodone-acetaminophen (NORCO)  10-325 MG tablet    Sig: Take 1 tablet by mouth every 6 (six) hours as needed for severe pain.    Dispense:  45 tablet    Refill:  0    Do not fill until [id number]   Patient's Medications  New Prescriptions   No medications on file  Previous Medications   BUPROPION (WELLBUTRIN SR) 150 MG 12 HR TABLET    1 tablet daily for 3 days, then 1 tablet twice daily. Stop smoking 14  days after starting medication   CYCLOBENZAPRINE (FLEXERIL) 10 MG TABLET    Take 10 mg by mouth.   CYCLOBENZAPRINE (FLEXERIL) 10 MG TABLET    Take 1 tablet (10 mg total) by mouth at bedtime.   FLUVIRIN 0.5 ML SUSY    ADM 0.5ML IM UTD   LISINOPRIL (PRINIVIL,ZESTRIL) 10 MG TABLET    TAKE 1 TABLET(10 MG) BY MOUTH DAILY   NALOXONE (NARCAN) NASAL SPRAY 4 MG/0.1 ML    For excess sedation from opioids.  Modified Medications   Modified Medication Previous Medication   HYDROCODONE-ACETAMINOPHEN (NORCO) 10-325 MG TABLET HYDROcodone-acetaminophen (NORCO) 10-325 MG tablet      Take 1 tablet by mouth every 6 (six) hours as needed for severe pain.    Take 1 tablet by mouth every 6 (six) hours as needed.   MORPHINE SULFATE ER 60 MG T12A Morphine Sulfate ER 60 MG T12A      Take 1 tablet by mouth 3 (three) times daily.    Take 1 tablet by mouth 3 (three) times daily.  Discontinued Medications   No medications on file   ---------------------------------------------------------------------------------------------------------------------- L4-L5 epidural steroid under fluoroscopic guidance with out sedation   Procedure: L4-L5 LESI with fluoroscopic guidance and moderate sedation  NOTE: The risks, benefits, and expectations of the procedure have been discussed and explained to the patient who was understanding and in agreement with suggested treatment plan. No guarantees were made.  DESCRIPTION OF PROCEDURE: Lumbar epidural steroid injection with no IV Versed, EKG, blood pressure, pulse, and pulse oximetry monitoring. The procedure was  performed with the patient in the prone position under fluoroscopic guidance. I injected subcutaneous lidocaine overlying the L4-L5 site after its fluoroscopic identifictation.  Using strict aseptic technique, I then advanced an 18-gauge Tuohy epidural needle in the midline using interlaminar approach via loss-of-resistance to saline technique. There was negative aspiration for heme or  CSF.  I then confirmed position with both AP and Lateral fluoroscan. 2 cc of Isovue were injected and a  total of 5 mL of Preservative-Free normal saline mixed with 40 mg of Kenalog and 1cc Ropicaine 0.2 percent were injected incrementally via the  epidurally placed needle. The needle was removed. The patient tolerated the injection well and was convalesced and discharged to home in stable condition. Should the patient have any post procedure difficulty they have been instructed on how to contact us for assistance.   Follow-up: Return for evaluation, med refill, procedure.    Molli Barrows, MD

## 2017-02-01 NOTE — Patient Instructions (Addendum)
Pain Management Discharge Instructions  General Discharge Instructions :  If you need to reach your doctor call: Monday-Friday 8:00 am - 4:00 pm at [phone] or toll free [phone].  After clinic hours [phone] to have operator reach doctor.  Bring all of your medication bottles to all your appointments in the pain clinic.  To cancel or reschedule your appointment with Pain Management please remember to call 24 hours in advance to avoid a fee.  Refer to the educational materials which you have been given on: General Risks, I had my Procedure. Discharge Instructions, Post Sedation.  Post Procedure Instructions:  The drugs you were given will stay in your system until tomorrow, so for the next 24 hours you should not drive, make any legal decisions or drink any alcoholic beverages.  You may eat anything you prefer, but it is better to start with liquids then soups and crackers, and gradually work up to solid foods.  Please notify your doctor immediately if you have any unusual bleeding, trouble breathing or pain that is not related to your normal pain.  Depending on the type of procedure that was done, some parts of your body may feel week and/or numb.  This usually clears up by tonight or the next day.  Walk with the use of an assistive device or accompanied by an adult for the 24 hours.  You may use ice on the affected area for the first 24 hours.  Put ice in a Ziploc bag and cover with a towel and place against area 15 minutes on 15 minutes off.  You may switch to heat after 24 hours.Epidural Steroid Injection Patient Information  Description: The epidural space surrounds the nerves as they exit the spinal cord.  In some patients, the nerves can be compressed and inflamed by a bulging disc or a tight spinal canal (spinal stenosis).  By injecting steroids into the epidural space, we can bring irritated nerves into direct contact with a potentially helpful medication.  These  steroids act directly on the irritated nerves and can reduce swelling and inflammation which often leads to decreased pain.  Epidural steroids may be injected anywhere along the spine and from the neck to the low back depending upon the location of your pain.   After numbing the skin with local anesthetic (like Novocaine), a small needle is passed into the epidural space slowly.  You may experience a sensation of pressure while this is being done.  The entire block usually last less than 10 minutes.  Conditions which may be treated by epidural steroids:   Low back and leg pain  Neck and arm pain  Spinal stenosis  Post-laminectomy syndrome  Herpes zoster (shingles) pain  Pain from compression fractures  Preparation for the injection:  1. Do not eat any solid food or dairy products within 8 hours of your appointment.  2. You may drink clear liquids up to 3 hours before appointment.  Clear liquids include water, black coffee, juice or soda.  No milk or cream please. 3. You may take your regular medication, including pain medications, with a sip of water before your appointment  Diabetics should hold regular insulin (if taken separately) and take 1/2 normal NPH dos the morning of the procedure.  Carry some sugar containing items with you to your appointment. 4. A driver must accompany you and be prepared to drive you home after your procedure.  5. Bring all your current medications with your. 6. An IV may be inserted and   sedation may be given at the discretion of the physician.   7. A blood pressure cuff, EKG and other monitors will often be applied during the procedure.  Some patients may need to have extra oxygen administered for a short period. 8. You will be asked to provide medical information, including your allergies, prior to the procedure.  We must know immediately if you are taking blood thinners (like Coumadin/Warfarin)  Or if you are allergic to IV iodine contrast (dye). We must  know if you could possible be pregnant.  Possible side-effects:  Bleeding from needle site  Infection (rare, may require surgery)  Nerve injury (rare)  Numbness & tingling (temporary)  Difficulty urinating (rare, temporary)  Spinal headache ( a headache worse with upright posture)  Light -headedness (temporary)  Pain at injection site (several days)  Decreased blood pressure (temporary)  Weakness in arm/leg (temporary)  Pressure sensation in back/neck (temporary)  Call if you experience:  Fever/chills associated with headache or increased back/neck pain.  Headache worsened by an upright position.  New onset weakness or numbness of an extremity below the injection site  Hives or difficulty breathing (go to the emergency room)  Inflammation or drainage at the infection site  Severe back/neck pain  Any new symptoms which are concerning to you  Please note:  Although the local anesthetic injected can often make your back or neck feel good for several hours after the injection, the pain will likely return.  It takes 3-7 days for steroids to work in the epidural space.  You may not notice any pain relief for at least that one week.  If effective, we will often do a series of three injections spaced 3-6 weeks apart to maximally decrease your pain.  After the initial series, we generally will wait several months before considering a repeat injection of the same type.  If you have any questions, please call [phone] [facility]  General Discharge Instructions :  If you need to reach your doctor call: Monday-Friday 8:00 am - 4:00 pm at [phone] or toll free [phone].  After clinic hours [phone] to have operator reach doctor.  Bring all of your medication bottles to all your appointments in the pain clinic.  To cancel or reschedule your appointment with Pain Management please  remember to call 24 hours in advance to avoid a fee.  Refer to the educational materials which you have been given on: General Risks, I had my Procedure. Discharge Instructions, Post Sedation.  Post Procedure Instructions:  The drugs you were given will stay in your system until tomorrow, so for the next 24 hours you should not drive, make any legal decisions or drink any alcoholic beverages.  You may eat anything you prefer, but it is better to start with liquids then soups and crackers, and gradually work up to solid foods.  Please notify your doctor immediately if you have any unusual bleeding, trouble breathing or pain that is not related to your normal pain.  Depending on the type of procedure that was done, some parts of your body may feel week and/or numb.  This usually clears up by tonight or the next day.  Walk with the use of an assistive device or accompanied by an adult for the 24 hours.  You may use ice on the affected area for the first 24 hours.  Put ice in a Ziploc bag and cover with a towel and place against area 15  minutes on 15 minutes off.  You may switch to heat after 24 hours. Epidural Steroid Injection An epidural steroid injection is a shot of steroid medicine and numbing medicine that is given into the space between the spinal cord and the bones in your back (epidural space). The shot helps relieve pain caused by an irritated or swollen nerve root. The amount of pain relief you get from the injection depends on what is causing the nerve to be swollen and irritated, and how long your pain lasts. You are more likely to benefit from this injection if your pain is strong and comes on suddenly rather than if you have had pain for a long time. Tell a health care provider about:  Any allergies you have.  All medicines you are taking, including vitamins, herbs, eye drops, creams, and over-the-counter medicines.  Any problems you or family members have had with anesthetic  medicines.  Any blood disorders you have.  Any surgeries you have had.  Any medical conditions you have.  Whether you are pregnant or may be pregnant. What are the risks? Generally, this is a safe procedure. However, problems may occur, including:  Headache.  Bleeding.  Infection.  Allergic reaction to medicines.  Damage to your nerves.  What happens before the procedure? Staying hydrated Follow instructions from your health care provider about hydration, which may include:  Up to 2 hours before the procedure - you may continue to drink clear liquids, such as water, clear fruit juice, black coffee, and plain tea.  Eating and drinking restrictions Follow instructions from your health care provider about eating and drinking, which may include:  8 hours before the procedure - stop eating heavy meals or foods such as meat, fried foods, or fatty foods.  6 hours before the procedure - stop eating light meals or foods, such as toast or cereal.  6 hours before the procedure - stop drinking milk or drinks that contain milk.  2 hours before the procedure - stop drinking clear liquids.  Medicine  You may be given medicines to lower anxiety.  Ask your health care provider about: ? Changing or stopping your regular medicines. This is especially important if you are taking diabetes medicines or blood thinners. ? Taking medicines such as aspirin and ibuprofen. These medicines can thin your blood. Do not take these medicines before your procedure if your health care provider instructs you not to. General instructions  Plan to have someone take you home from the hospital or clinic. What happens during the procedure?  You may receive a medicine to help you relax (sedative).  You will be asked to lie on your abdomen.  The injection site will be cleaned.  A numbing medicine (local anesthetic) will be used to numb the injection site.  A needle will be inserted through your skin  into the epidural space. You may feel some discomfort when this happens. An X-ray machine will be used to make sure the needle is put as close as possible to the affected nerve.  A steroid medicine and a local anesthetic will be injected into the epidural space.  The needle will be removed.  A bandage (dressing) will be put over the injection site. What happens after the procedure?  Your blood pressure, heart rate, breathing rate, and blood oxygen level will be monitored until the medicines you were given have worn off.  Your arm or leg may feel weak or numb for a few hours.  The injection site may  feel sore.  Do not drive for 24 hours if you received a sedative. This information is not intended to replace advice given to you by your health care provider. Make sure you discuss any questions you have with your health care provider. Document Released: 07/13/2007 Document Revised: 09/17/2015 Document Reviewed: 07/22/2015 Elsevier Interactive Patient Education  2017 [employer].

## 2017-02-01 NOTE — Progress Notes (Signed)
Nursing Pain Medication Assessment:  Safety precautions to be maintained throughout the outpatient stay will include: orient to surroundings, keep bed in low position, maintain call bell within reach at all times, provide assistance with transfer out of bed and ambulation.  Medication Inspection Compliance: Pill count conducted under aseptic conditions, in front of the patient. Neither the pills nor the bottle was removed from the patient's sight at any time. Once count was completed pills were immediately returned to the patient in their original bottle.  Medication #1: Morphine ER (MSContin) Pill/Patch Count: 21 of 75 pills remain Pill/Patch Appearance: Markings consistent with prescribed medication Bottle Appearance: Standard pharmacy container. Clearly labeled. Filled Date: 09 / 23 / 2018 Last Medication intake:  Today  Medication #2: Hydrocodone/APAP Pill/Patch Count: 10.5 of 45 pills remain Pill/Patch Appearance: Markings consistent with prescribed medication Bottle Appearance: Standard pharmacy container. Clearly labeled. Filled Date: 09 / 23/ 2018 Last Medication intake:  Today

## 2017-02-02 ENCOUNTER — Telehealth: Payer: Self-pay

## 2017-02-02 NOTE — Telephone Encounter (Signed)
No answer. Left message on answering machine to call if needed. 

## 2017-02-08 ENCOUNTER — Encounter: Payer: Self-pay | Admitting: Family Medicine

## 2017-02-08 ENCOUNTER — Ambulatory Visit (INDEPENDENT_AMBULATORY_CARE_PROVIDER_SITE_OTHER): Payer: Medicare Other | Admitting: Family Medicine

## 2017-02-08 ENCOUNTER — Telehealth: Payer: Self-pay

## 2017-02-08 ENCOUNTER — Other Ambulatory Visit: Payer: Self-pay | Admitting: Family Medicine

## 2017-02-08 VITALS — BP 162/102 | HR 80 | Temp 98.2°F | Resp 16 | Wt 171.0 lb

## 2017-02-08 DIAGNOSIS — E039 Hypothyroidism, unspecified: Secondary | ICD-10-CM | POA: Diagnosis not present

## 2017-02-08 DIAGNOSIS — G894 Chronic pain syndrome: Secondary | ICD-10-CM | POA: Diagnosis not present

## 2017-02-08 DIAGNOSIS — R030 Elevated blood-pressure reading, without diagnosis of hypertension: Secondary | ICD-10-CM | POA: Diagnosis not present

## 2017-02-08 DIAGNOSIS — R42 Dizziness and giddiness: Secondary | ICD-10-CM | POA: Diagnosis not present

## 2017-02-08 MED ORDER — AMLODIPINE BESYLATE 5 MG PO TABS: 5.0000 mg | ORAL_TABLET | Freq: Every day | ORAL | 0 refills | Status: DC

## 2017-02-08 NOTE — Progress Notes (Signed)
Patient: Megan Brock Female    DOB: [birth year]   36 y.o.   MRN: [id number] Visit Date: 02/08/2017  Today's Provider: Lelon Huh, MD   Chief Complaint  Patient presents with  . Hypertension   Subjective:    HPI  Hypertension, follow-up:  BP Readings from Last 3 Encounters:  02/08/17 (!) 162/102  02/01/17 112/64  01/06/17 122/80     Weight trend: stable Wt Readings from Last 3 Encounters:  02/08/17 171 lb (77.6 kg)  02/01/17 165 lb (74.8 kg)  01/06/17 171 lb (77.6 kg)    Current diet: well balanced   Patient reports that her BP has been elevated for the last 3-4 days averaging in the 170s/100s. She has had occasional dizziness and headaches. The worse episode was when she was at the grocery store on 02/05/2017 and felt like she was going to pass out. Has otherwise felt well, no dyspnea, chest pains, palpitations, myalgias, arthralgias, visual disturbances, cold or flu symptoms. Taken no new new OTC medications. Is not having any more pain than usual and had no changes in her pain medications.      No Known Allergies   Current Outpatient Prescriptions:  .  buPROPion (WELLBUTRIN SR) 150 MG 12 hr tablet, 1 tablet daily for 3 days, then 1 tablet twice daily. Stop smoking 14 days after starting medication, Disp: 60 tablet, Rfl: 5 .  cyclobenzaprine (FLEXERIL) 10 MG tablet, Take 10 mg by mouth., Disp: , Rfl:  .  cyclobenzaprine (FLEXERIL) 10 MG tablet, Take 1 tablet (10 mg total) by mouth at bedtime., Disp: 30 tablet, Rfl: 3 .  FLUVIRIN 0.5 ML SUSY, ADM 0.5ML IM UTD, Disp: , Rfl: 0 .  HYDROcodone-acetaminophen (NORCO) 10-325 MG tablet, Take 1 tablet by mouth every 6 (six) hours as needed for severe pain., Disp: 45 tablet, Rfl: 0 .  lisinopril (PRINIVIL,ZESTRIL) 10 MG tablet, TAKE 1 TABLET(10 MG) BY MOUTH DAILY, Disp: 90 tablet, Rfl: 4 .  Morphine Sulfate ER 60 MG T12A, Take 1 tablet by mouth 3 (three) times daily., Disp: 75 tablet, Rfl: 0 .  naloxone (NARCAN)  nasal spray 4 mg/0.1 mL, For excess sedation from opioids., Disp: 1 kit, Rfl: 2 No current facility-administered medications for this visit.   Facility-Administered Medications Ordered in Other Visits:  .  iopamidol (ISOVUE-M) 41 % intrathecal injection 20 mL, 20 mL, Other, Once PRN, Molli Barrows, MD, 10 mL at 02/01/17 1013  Review of Systems  Constitutional: Positive for fatigue.  Genitourinary: Negative.   Musculoskeletal: Negative.   Neurological: Positive for dizziness, light-headedness and headaches. Negative for seizures, syncope, speech difficulty and weakness.  Psychiatric/Behavioral: Negative.     Social History  Substance Use Topics  . Smoking status: Current Every Day Smoker    Packs/day: 0.75    Types: Cigarettes  . Smokeless tobacco: Never Used     Comment: 1/2-1 pd since age 44  . Alcohol use No   Objective:   BP (!) 162/102 (BP Location: Right Arm, Patient Position: Sitting, Cuff Size: Normal)   Pulse 80   Temp 98.2 F (36.8 C)   Resp 16   Wt 171 lb (77.6 kg)   LMP 01/18/2017   BMI 29.35 kg/m  Vitals:   02/08/17 1601  BP: (!) 162/102  Pulse: 80  Resp: 16  Temp: 98.2 F (36.8 C)  Weight: 171 lb (77.6 kg)     Physical Exam   General Appearance:    Alert, cooperative, no distress  Eyes:    PERRL, conjunctiva/corneas clear, EOM's intact       Lungs:     Clear to auscultation bilaterally, respirations unlabored  Heart:    Regular rate and rhythm  Neurologic:   Awake, alert, oriented x 3. No apparent focal neurological           defect.           Assessment & Plan:     1. Elevated blood pressure reading No clear cause of recent elevation of BP. Will check labs and add amlodipine. Last epidural was one week ago which was without incident.  - TSH - amLODipine (NORVASC) 5 MG tablet; Take 1 tablet (5 mg total) by mouth daily.  Dispense: 30 tablet; Refill: 0 - T4, free  2. Hypothyroidism, unspecified type  - TSH - T4, free  3. Chronic pain  syndrome Stable on current medications.   4. Dizziness  - COMPLETE METABOLIC PANEL WITH GFR - CBC       Lelon Huh, MD  [facility]

## 2017-02-08 NOTE — Telephone Encounter (Signed)
Pt called c/o elevated BP and dizziness. Has been occurring x 3 days. Denies chest pain, SOB, H/A. BP has been has high as 187/117 and was 149/114 this morning. Denies taking oral decongestants. Spoke with Dr. Sherrie MustacheFisher, and he agreed to see pt at 3:45 today.

## 2017-02-09 ENCOUNTER — Telehealth: Payer: Self-pay

## 2017-02-09 LAB — COMPLETE METABOLIC PANEL WITH GFR
AG RATIO: 1.8 (calc) (ref 1.0–2.5)
ALKALINE PHOSPHATASE (APISO): 49 U/L (ref 33–115)
ALT: 10 U/L (ref 6–29)
AST: 12 U/L (ref 10–30)
Albumin: 4.5 g/dL (ref 3.6–5.1)
BILIRUBIN TOTAL: 0.3 mg/dL (ref 0.2–1.2)
BUN: 18 mg/dL (ref 7–25)
CHLORIDE: 105 mmol/L (ref 98–110)
CO2: 29 mmol/L (ref 20–32)
Calcium: 9.4 mg/dL (ref 8.6–10.2)
Creat: 0.98 mg/dL (ref 0.50–1.10)
GFR, Est African American: 87 mL/min/{1.73_m2} (ref >=60)
GFR, Est Non African American: 75 mL/min/{1.73_m2} (ref >=60)
GLUCOSE: 87 mg/dL (ref 65–99)
Globulin: 2.5 g/dL (calc) (ref 1.9–3.7)
POTASSIUM: 4 mmol/L (ref 3.5–5.3)
Sodium: 140 mmol/L (ref 135–146)
TOTAL PROTEIN: 7 g/dL (ref 6.1–8.1)

## 2017-02-09 LAB — CBC
HCT: 37.7 % (ref 35.0–45.0)
Hemoglobin: 13.2 g/dL (ref 11.7–15.5)
MCH: 31.2 pg (ref 27.0–33.0)
MCHC: 35 g/dL (ref 32.0–36.0)
MCV: 89.1 fL (ref 80.0–100.0)
MPV: 11.5 fL (ref 7.5–12.5)
PLATELETS: 197 10*3/uL (ref 140–400)
RBC: 4.23 10*6/uL (ref 3.80–5.10)
RDW: 11.4 % (ref 11.0–15.0)
WBC: 9.2 10*3/uL (ref 3.8–10.8)

## 2017-02-09 LAB — TSH: TSH: 1.6 m[IU]/L

## 2017-02-09 LAB — T4, FREE: FREE T4: 1.2 ng/dL (ref 0.8–1.8)

## 2017-02-09 NOTE — Telephone Encounter (Signed)
Patient was seen in the office yesterday. Pharmacy is requesting a 90 day supply instead of 30 day. Please review. Thanks!

## 2017-02-09 NOTE — Telephone Encounter (Signed)
Patient advised as directed below. Patient scheduled to follow-up BP Nov. 7, 2018 At 11:00 am.  Thanks,  -Joseline

## 2017-02-09 NOTE — Telephone Encounter (Signed)
-----   Message from Malva Limesonald E Fisher, MD sent at 02/09/2017  7:53 AM EDT ----- Labs are all normal, go ahead and start amlodipine that was prescribed and schedule follow up in to check bp in 7-10 days.

## 2017-02-23 ENCOUNTER — Ambulatory Visit (INDEPENDENT_AMBULATORY_CARE_PROVIDER_SITE_OTHER): Payer: Medicare Other | Admitting: Family Medicine

## 2017-02-23 ENCOUNTER — Encounter: Payer: Self-pay | Admitting: Family Medicine

## 2017-02-23 VITALS — BP 130/84 | HR 94 | Temp 98.4°F | Resp 16 | Ht 64.0 in | Wt 169.0 lb

## 2017-02-23 DIAGNOSIS — N2889 Other specified disorders of kidney and ureter: Secondary | ICD-10-CM | POA: Diagnosis not present

## 2017-02-23 DIAGNOSIS — Q612 Polycystic kidney, adult type: Secondary | ICD-10-CM

## 2017-02-23 DIAGNOSIS — I151 Hypertension secondary to other renal disorders: Secondary | ICD-10-CM

## 2017-02-23 NOTE — Progress Notes (Signed)
Patient: Megan Brock Female    DOB: [birth year]   36 y.o.   MRN: [id number] Visit Date: 02/23/2017  Today's Provider: Lelon Huh, MD   Chief Complaint  Patient presents with  . Follow-up  . Hypertension   Subjective:    HPI  Elevated blood pressure reading From 02/08/2017-labs checked. Added amLODipine (NORVASC) 5 MG tablet.   Patient states she is feeling better and her Bp readings have improved since medication change. Home readings averaging around 126/84.  No Known Allergies   Current Outpatient Medications:  .  amLODipine (NORVASC) 5 MG tablet, TAKE 1 TABLET(5 MG) BY MOUTH DAILY, Disp: 90 tablet, Rfl: 0 .  buPROPion (WELLBUTRIN SR) 150 MG 12 hr tablet, 1 tablet daily for 3 days, then 1 tablet twice daily. Stop smoking 14 days after starting medication, Disp: 60 tablet, Rfl: 5 .  cyclobenzaprine (FLEXERIL) 10 MG tablet, Take 1 tablet (10 mg total) by mouth at bedtime., Disp: 30 tablet, Rfl: 3 .  HYDROcodone-acetaminophen (NORCO) 10-325 MG tablet, Take 1 tablet by mouth every 6 (six) hours as needed for severe pain., Disp: 45 tablet, Rfl: 0 .  lisinopril (PRINIVIL,ZESTRIL) 10 MG tablet, TAKE 1 TABLET(10 MG) BY MOUTH DAILY, Disp: 90 tablet, Rfl: 4 .  Morphine Sulfate ER 60 MG T12A, Take 1 tablet by mouth 3 (three) times daily., Disp: 75 tablet, Rfl: 0 .  naloxone (NARCAN) nasal spray 4 mg/0.1 mL, For excess sedation from opioids., Disp: 1 kit, Rfl: 2 .  FLUVIRIN 0.5 ML SUSY, ADM 0.5ML IM UTD, Disp: , Rfl: 0  Review of Systems  Constitutional: Negative for appetite change, chills, fatigue and fever.  Respiratory: Negative for chest tightness and shortness of breath.   Cardiovascular: Negative for chest pain and palpitations.  Gastrointestinal: Negative for abdominal pain, nausea and vomiting.  Neurological: Negative for dizziness and weakness.    Social History   Tobacco Use  . Smoking status: Current Every Day Smoker    Packs/day: 0.75    Types:  Cigarettes  . Smokeless tobacco: Never Used  . Tobacco comment: 1/2-1 pd since age 14  Substance Use Topics  . Alcohol use: No    Alcohol/week: 0.0 oz   Objective:   BP 140/88 (BP Location: Right Arm, Patient Position: Sitting, Cuff Size: Normal)   Pulse 94   Temp 98.4 F (36.9 C) (Oral)   Resp 16   Ht _0  (1.626 m)   Wt 169 lb (76.7 kg)   SpO2 98%   BMI 29.01 kg/m  Vitals:   02/23/17 1105  BP: 140/88  Pulse: 94  Resp: 16  Temp: 98.4 F (36.9 C)  TempSrc: Oral  SpO2: 98%  Weight: 169 lb (76.7 kg)  Height: _1  (1.626 m)     Physical Exam  General appearance: alert, well developed, well nourished, cooperative and in no distress Head: Normocephalic, without obvious abnormality, atraumatic Respiratory: Respirations even and unlabored, normal respiratory rate Extremities: No gross deformities Skin: Skin color, texture, turgor normal. No rashes seen  Psych: Appropriate mood and affect. Neurologic: Mental status: Alert, oriented to person, place, and time, thought content appropriate.     Assessment & Plan:     1. Hypertension secondary to other renal disorders Much better with addition of amlodipine. Continue current medications.    2. Polycystic kidney, autosomal dominant Continue regular follow Dr. Jacqlyn Larsen in February as scheduled.   Return in about 3 months (around 05/26/2017) for hypertension.  Lelon Huh, MD  [facility]

## 2017-03-03 ENCOUNTER — Other Ambulatory Visit: Payer: Self-pay | Admitting: Family Medicine

## 2017-03-03 DIAGNOSIS — R030 Elevated blood-pressure reading, without diagnosis of hypertension: Secondary | ICD-10-CM

## 2017-03-24 ENCOUNTER — Encounter: Payer: Self-pay | Admitting: Family Medicine

## 2017-03-31 ENCOUNTER — Other Ambulatory Visit: Payer: Self-pay | Admitting: Anesthesiology

## 2017-03-31 ENCOUNTER — Other Ambulatory Visit: Payer: Self-pay

## 2017-03-31 ENCOUNTER — Encounter: Payer: Self-pay | Admitting: Anesthesiology

## 2017-03-31 ENCOUNTER — Ambulatory Visit
Admission: RE | Admit: 2017-03-31 | Discharge: 2017-03-31 | Disposition: A | Discharge status: Home / Self Care | Payer: Medicare Other | Source: Ambulatory Visit | Attending: Anesthesiology | Admitting: Anesthesiology

## 2017-03-31 ENCOUNTER — Ambulatory Visit (HOSPITAL_BASED_OUTPATIENT_CLINIC_OR_DEPARTMENT_OTHER): Payer: Medicare Other | Admitting: Anesthesiology

## 2017-03-31 VITALS — BP 121/82 | HR 108 | Temp 98.6°F | Resp 16 | Ht 64.0 in | Wt 165.0 lb

## 2017-03-31 DIAGNOSIS — M5431 Sciatica, right side: Secondary | ICD-10-CM

## 2017-03-31 DIAGNOSIS — Q613 Polycystic kidney, unspecified: Secondary | ICD-10-CM | POA: Diagnosis not present

## 2017-03-31 DIAGNOSIS — M47816 Spondylosis without myelopathy or radiculopathy, lumbar region: Secondary | ICD-10-CM | POA: Diagnosis not present

## 2017-03-31 DIAGNOSIS — M5441 Lumbago with sciatica, right side: Secondary | ICD-10-CM | POA: Insufficient documentation

## 2017-03-31 DIAGNOSIS — Z79891 Long term (current) use of opiate analgesic: Secondary | ICD-10-CM | POA: Insufficient documentation

## 2017-03-31 DIAGNOSIS — M5442 Lumbago with sciatica, left side: Secondary | ICD-10-CM | POA: Insufficient documentation

## 2017-03-31 DIAGNOSIS — M5136 Other intervertebral disc degeneration, lumbar region: Secondary | ICD-10-CM | POA: Diagnosis not present

## 2017-03-31 DIAGNOSIS — M51369 Other intervertebral disc degeneration, lumbar region without mention of lumbar back pain or lower extremity pain: Secondary | ICD-10-CM

## 2017-03-31 DIAGNOSIS — R52 Pain, unspecified: Secondary | ICD-10-CM

## 2017-03-31 DIAGNOSIS — Z79899 Other long term (current) drug therapy: Secondary | ICD-10-CM | POA: Insufficient documentation

## 2017-03-31 DIAGNOSIS — M545 Low back pain: Secondary | ICD-10-CM | POA: Diagnosis present

## 2017-03-31 DIAGNOSIS — G8929 Other chronic pain: Secondary | ICD-10-CM | POA: Diagnosis not present

## 2017-03-31 DIAGNOSIS — M5432 Sciatica, left side: Secondary | ICD-10-CM

## 2017-03-31 DIAGNOSIS — M47896 Other spondylosis, lumbar region: Secondary | ICD-10-CM | POA: Diagnosis not present

## 2017-03-31 MED ORDER — SODIUM CHLORIDE 0.9% FLUSH
10.0000 mL | Freq: Once | INTRAVENOUS | Status: AC
  Administered 2017-03-31: 10 mL

## 2017-03-31 MED ORDER — HYDROCODONE-ACETAMINOPHEN 10-325 MG PO TABS: 1.0000 | ORAL_TABLET | Freq: Four times a day (QID) | ORAL | 0 refills | Status: DC | PRN

## 2017-03-31 MED ORDER — IOPAMIDOL (ISOVUE-M 200) INJECTION 41%
20.0000 mL | Freq: Once | INTRAMUSCULAR | Status: DC | PRN
  Administered 2017-03-31: 20 mL
  Filled 2017-03-31: qty 20

## 2017-03-31 MED ORDER — MORPHINE SULFATE ER 60 MG PO T12A: 1.0000 | EXTENDED_RELEASE_TABLET | Freq: Three times a day (TID) | ORAL | 0 refills | Status: DC

## 2017-03-31 MED ORDER — ROPIVACAINE HCL 2 MG/ML IJ SOLN
10.0000 mL | Freq: Once | INTRAMUSCULAR | Status: AC
  Administered 2017-03-31: 1 mL via EPIDURAL
  Filled 2017-03-31: qty 10

## 2017-03-31 MED ORDER — LACTATED RINGERS IV SOLN: 1000.0000 mL | INTRAVENOUS | Status: DC

## 2017-03-31 MED ORDER — LIDOCAINE HCL (PF) 1 % IJ SOLN
10.0000 mL | Freq: Once | INTRAMUSCULAR | Status: AC
Start: 2017-03-31 — End: 2017-03-31
  Administered 2017-03-31: 5 mL via SUBCUTANEOUS
  Filled 2017-03-31: qty 10

## 2017-03-31 MED ORDER — IOPAMIDOL (ISOVUE-M 200) INJECTION 41%
INTRAMUSCULAR | Status: AC
Start: 2017-03-31 — End: ?
  Filled 2017-03-31: qty 10

## 2017-03-31 MED ORDER — TRIAMCINOLONE ACETONIDE 40 MG/ML IJ SUSP
40.0000 mg | Freq: Once | INTRAMUSCULAR | Status: AC
  Administered 2017-03-31: 40 mg
  Filled 2017-03-31: qty 1

## 2017-03-31 NOTE — Progress Notes (Signed)
Nursing Pain Medication Assessment:  Safety precautions to be maintained throughout the outpatient stay will include: orient to surroundings, keep bed in low position, maintain call bell within reach at all times, provide assistance with transfer out of bed and ambulation.  Medication Inspection Compliance: Pill count conducted under aseptic conditions, in front of the patient. Neither the pills nor the bottle was removed from the patient's sight at any time. Once count was completed pills were immediately returned to the patient in their original bottle.  Medication #1: Morphine ER (MSContin) Pill/Patch Count: 25 of 75 pills remain Pill/Patch Appearance: Markings consistent with prescribed medication Bottle Appearance: Standard pharmacy container. Clearly labeled. Filled Date: 7611 / 23 / 2018 Last Medication intake:  Today  Medication #2: Hydrocodone/APAP Pill/Patch Count: 18 of 45 pills remain Pill/Patch Appearance: Markings consistent with prescribed medication Bottle Appearance: Standard pharmacy container. Clearly labeled. Filled Date: 2811 / 23 / 2018 Last Medication intake:  Yesterday

## 2017-04-01 ENCOUNTER — Telehealth: Payer: Self-pay | Admitting: *Deleted

## 2017-04-01 NOTE — Telephone Encounter (Signed)
No problems post procedure. 

## 2017-04-01 NOTE — Progress Notes (Signed)
Subjective:  Patient ID: Megan Brock, female    DOB: 05-18-1980  Age: 36 y.o. MRN: [id number]  CC: Back Pain (low)   Procedure: L5-S1 epidural steroid under fluoroscopic guidance  HPI Megan Brock presents for reevaluation.  She was last seen 2 months ago and had an L4-5 epidural at that time.  Her low back pain is has diminished by approximately 25-50%.  Her right lower extremity pain has resolved.  She still having some pain on the left side primarily involving the left buttock left posterior leg and occasionally left calf with some cramping.  No change in lower extremity strength or function is noted and her bowel bladder functions been stable.  She is followed by Dr. Jacqlyn Larsen for her polycystic kidney disease.  She is taking her medications as prescribed and based on her narcotic assessment sheet she continues to derive functional improvement with the medications with minimal side effects.  These are well-tolerated.  These keep her pain under good control whereas she has failed conservative therapy with other medication management.  Outpatient Medications Prior to Visit  Medication Sig Dispense Refill  . amLODipine (NORVASC) 5 MG tablet TAKE 1 TABLET(5 MG) BY MOUTH DAILY 30 tablet 12  . buPROPion (WELLBUTRIN SR) 150 MG 12 hr tablet 1 tablet daily for 3 days, then 1 tablet twice daily. Stop smoking 14 days after starting medication 60 tablet 5  . cyclobenzaprine (FLEXERIL) 10 MG tablet Take 1 tablet (10 mg total) by mouth at bedtime. 30 tablet 3  . lisinopril (PRINIVIL,ZESTRIL) 10 MG tablet TAKE 1 TABLET(10 MG) BY MOUTH DAILY 90 tablet 4  . naloxone (NARCAN) nasal spray 4 mg/0.1 mL For excess sedation from opioids. 1 kit 2  . HYDROcodone-acetaminophen (NORCO) 10-325 MG tablet Take 1 tablet by mouth every 6 (six) hours as needed for severe pain. 45 tablet 0  . Morphine Sulfate ER 60 MG T12A Take 1 tablet by mouth 3 (three) times daily. 75 tablet 0  . FLUVIRIN 0.5 ML SUSY ADM 0.5ML IM UTD   0   No facility-administered medications prior to visit.     Review of Systems CNS: No sedation or confusion Cardiac: No angina or palpitations GI: No abdominal pain or constipation  Objective:  BP 121/82   Pulse (!) 108   Temp 98.6 F (37 C)   Resp 16   Ht '5\' 4"'$  (1.626 m)   Wt 165 lb (74.8 kg)   LMP 03/31/2017   SpO2 99%   BMI 28.32 kg/m    BP Readings from Last 3 Encounters:  03/31/17 121/82  02/23/17 130/84  02/08/17 (!) 162/102     Wt Readings from Last 3 Encounters:  03/31/17 165 lb (74.8 kg)  02/23/17 169 lb (76.7 kg)  02/08/17 171 lb (77.6 kg)     Physical Exam Pt is alert and oriented PERRL EOMI HEART IS RRR no murmur or rub LCTA no wheezing or rhales MUSCULOSKELETAL reveals some paraspinous muscle tenderness but no overt trigger points.  She has chronic bilateral flank pain tenderness.  This is not changed in nature or character.  Her strength appears at baseline.  She does have a positive straight leg raise left side negative on the right.  She has some mild pain with extension at the low back.  Her muscle tone and bulk is at baseline.  Labs  No results found for: HGBA1C Lab Results  Component Value Date   LDLCALC 105 (H) 07/17/2015   CREATININE 0.98 02/08/2017    --------------------------------------------------------------------------------------------------------------------  Lab Results  Component Value Date   WBC 9.2 02/08/2017   HGB 13.2 02/08/2017   HCT 37.7 02/08/2017   PLT 197 02/08/2017   GLUCOSE 87 02/08/2017   CHOL 174 07/17/2015   TRIG 139 07/17/2015   HDL 41 07/17/2015   LDLCALC 105 (H) 07/17/2015   ALT 10 02/08/2017   AST 12 02/08/2017   NA 140 02/08/2017   K 4.0 02/08/2017   CL 105 02/08/2017   CREATININE 0.98 02/08/2017   BUN 18 02/08/2017   CO2 29 02/08/2017   TSH 1.60 02/08/2017    --------------------------------------------------------------------------------------------------------------------- Dg C-arm 1-60  Min-no Report  Result Date: 03/31/2017 Fluoroscopy was utilized by the requesting physician.  No radiographic interpretation.     Assessment & Plan:   Megan Brock was seen today for back pain.  Diagnoses and all orders for this visit:  Lumbar spondylosis  Sciatica of right side -     Lumbar Epidural Injection  Left sided sciatica -     Lumbar Epidural Injection  Long term current use of opiate analgesic  DDD (degenerative disc disease), lumbar  Chronic bilateral low back pain with left-sided sciatica  Polycystic kidney disease  Other orders -     Discontinue: HYDROcodone-acetaminophen (NORCO) 10-325 MG tablet; Take 1 tablet by mouth every 6 (six) hours as needed for severe pain. -     HYDROcodone-acetaminophen (NORCO) 10-325 MG tablet; Take 1 tablet by mouth every 6 (six) hours as needed for severe pain. -     Discontinue: Morphine Sulfate ER 60 MG T12A; Take 1 tablet by mouth 3 (three) times daily. -     Morphine Sulfate ER 60 MG T12A; Take 1 tablet by mouth 3 (three) times daily. -     triamcinolone acetonide (KENALOG-40) injection 40 mg -     sodium chloride flush (NS) 0.9 % injection 10 mL -     ropivacaine (PF) 2 mg/mL (0.2%) (NAROPIN) injection 10 mL -     lidocaine (PF) (XYLOCAINE) 1 % injection 10 mL -     lactated ringers infusion 1,000 mL -     iopamidol (ISOVUE-M) 41 % intrathecal injection 20 mL        ----------------------------------------------------------------------------------------------------------------------  Problem List Items Addressed This Visit      Unprioritized   Chronic lower back pain   Relevant Medications   HYDROcodone-acetaminophen (NORCO) 10-325 MG tablet   Morphine Sulfate ER 60 MG T12A   triamcinolone acetonide (KENALOG-40) injection 40 mg (Completed)   Long term current use of opiate analgesic (Chronic)   Lumbar spondylosis - Primary (Chronic)   Relevant Medications   HYDROcodone-acetaminophen (NORCO) 10-325 MG tablet    Morphine Sulfate ER 60 MG T12A   triamcinolone acetonide (KENALOG-40) injection 40 mg (Completed)   Polycystic kidney disease    Other Visit Diagnoses    Sciatica of right side       Left sided sciatica       DDD (degenerative disc disease), lumbar       Relevant Medications   HYDROcodone-acetaminophen (NORCO) 10-325 MG tablet   Morphine Sulfate ER 60 MG T12A   triamcinolone acetonide (KENALOG-40) injection 40 mg (Completed)        ----------------------------------------------------------------------------------------------------------------------  1. Sciatica of right side We will proceed with an L5-S1 epidural steroid injection today.  The risks and benefits of been once again reviewed and all questions answered.  I want her to continue with efforts at weight loss stretching strengthening for core and return to clinic in 2 months  for reevaluation. - Lumbar Epidural Injection  2. Left sided sciatica As above - Lumbar Epidural Injection  3. Lumbar spondylosis As above  4. Long term current use of opiate analgesic We have reviewed the [facility] practitioner database information and it is appropriate.  Refills were given for her hydrocodone for breakthrough pain and baseline MS Contin dated on December 22 and January 21.   5. DDD (degenerative disc disease), lumbar   6. Chronic bilateral low back pain with left-sided sciatica   7. Polycystic kidney disease     ----------------------------------------------------------------------------------------------------------------------  I am having Megan Brock. Furth maintain her FLUVIRIN, naloxone, cyclobenzaprine, lisinopril, buPROPion, amLODipine, HYDROcodone-acetaminophen, and Morphine Sulfate ER. We administered triamcinolone acetonide, sodium chloride flush, ropivacaine (PF) 2 mg/mL (0.2%), lidocaine (PF), and iopamidol.   Meds ordered this encounter  Medications  . DISCONTD: HYDROcodone-acetaminophen (NORCO) 10-325  MG tablet    Sig: Take 1 tablet by mouth every 6 (six) hours as needed for severe pain.    Dispense:  45 tablet    Refill:  0    Do not fill until [id number]  . HYDROcodone-acetaminophen (NORCO) 10-325 MG tablet    Sig: Take 1 tablet by mouth every 6 (six) hours as needed for severe pain.    Dispense:  45 tablet    Refill:  0    Do not fill until [id number]  . DISCONTD: Morphine Sulfate ER 60 MG T12A    Sig: Take 1 tablet by mouth 3 (three) times daily.    Dispense:  75 tablet    Refill:  0    Do not fill until [id number]  . Morphine Sulfate ER 60 MG T12A    Sig: Take 1 tablet by mouth 3 (three) times daily.    Dispense:  75 tablet    Refill:  0    Do not fill until [id number]  . triamcinolone acetonide (KENALOG-40) injection 40 mg  . sodium chloride flush (NS) 0.9 % injection 10 mL  . ropivacaine (PF) 2 mg/mL (0.2%) (NAROPIN) injection 10 mL  . lidocaine (PF) (XYLOCAINE) 1 % injection 10 mL  . lactated ringers infusion 1,000 mL  . iopamidol (ISOVUE-M) 41 % intrathecal injection 20 mL     Medication List        Accurate as of 03/31/17 11:59 PM. Always use your most recent med list.          amLODipine 5 MG tablet Commonly known as:  NORVASC TAKE 1 TABLET(5 MG) BY MOUTH DAILY   buPROPion 150 MG 12 hr tablet Commonly known as:  WELLBUTRIN SR 1 tablet daily for 3 days, then 1 tablet twice daily. Stop smoking 14 days after starting medication   cyclobenzaprine 10 MG tablet Commonly known as:  FLEXERIL Take 1 tablet (10 mg total) by mouth at bedtime.   FLUVIRIN 0.5 ML Susy Generic drug:  Influenza Vac Typ A&B Surf Ant   HYDROcodone-acetaminophen 10-325 MG tablet Commonly known as:  NORCO Take 1 tablet by mouth every 6 (six) hours as needed for severe pain.   lisinopril 10 MG tablet Commonly known as:  PRINIVIL,ZESTRIL TAKE 1 TABLET(10 MG) BY MOUTH DAILY   Morphine Sulfate ER 60 MG T12a Take 1 tablet by mouth 3 (three) times daily.   naloxone 4 MG/0.1ML Liqd nasal  spray kit Commonly known as:  NARCAN For excess sedation from opioids.       Where to Get Your Medications    You can get these medications from any pharmacy   Bring  a paper prescription for each of these medications  HYDROcodone-acetaminophen 10-325 MG tablet  Morphine Sulfate ER 60 MG T12a    ----------------------------------------------------------------------------------------------------------------------  Follow-up: Return in about 2 months (around 06/01/2017) for evaluation, med refill.   Procedure: L5-S1 epidural steroid under fluoroscopic guidance   Procedure: L5-S1 LESI with fluoroscopic guidance and moderate sedation  NOTE: The risks, benefits, and expectations of the procedure have been discussed and explained to the patient who was understanding and in agreement with suggested treatment plan. No guarantees were made.  DESCRIPTION OF PROCEDURE: Lumbar epidural steroid injection with no IV Versed, EKG, blood pressure, pulse, and pulse oximetry monitoring. The procedure was performed with the patient in the prone position under fluoroscopic guidance. I injected subcutaneous lidocaine overlying the L5-S1 site after its fluoroscopic identifictation.  Using strict aseptic technique, I then advanced an 18-gauge Tuohy epidural needle in the midline using interlaminar approach via loss-of-resistance to saline technique. There was negative aspiration for heme or  CSF.  I then confirmed position with both AP and Lateral fluoroscan.  2 cc of Isovue were injected and a  total of 5 mL of Preservative-Free normal saline mixed with 40 mg of Kenalog and 1cc Ropicaine 0.2 percent were injected incrementally via the  epidurally placed needle. The needle was removed. The patient tolerated the injection well and was convalesced and discharged to home in stable condition. Should the patient have any post procedure difficulty they have been instructed on how to contact us for assistance.   Molli Barrows, MD

## 2017-04-18 DIAGNOSIS — F339 Major depressive disorder, recurrent, unspecified: Secondary | ICD-10-CM | POA: Diagnosis not present

## 2017-05-06 ENCOUNTER — Other Ambulatory Visit: Payer: Self-pay | Admitting: Anesthesiology

## 2017-05-11 ENCOUNTER — Telehealth: Payer: Self-pay

## 2017-05-11 NOTE — Telephone Encounter (Signed)
Called pt to confirm pharm and [facility]

## 2017-05-11 NOTE — Telephone Encounter (Signed)
Pt called and checking on RX for Flexeril, say's she sent last week but says still pending, States Dr Pernell DupreAdams said that's one we could call in when needed.

## 2017-05-25 DIAGNOSIS — H43313 Vitreous membranes and strands, bilateral: Secondary | ICD-10-CM | POA: Diagnosis not present

## 2017-05-26 ENCOUNTER — Ambulatory Visit: Payer: Self-pay | Admitting: Family Medicine

## 2017-05-26 DIAGNOSIS — N390 Urinary tract infection, site not specified: Secondary | ICD-10-CM | POA: Diagnosis not present

## 2017-05-26 DIAGNOSIS — N23 Unspecified renal colic: Secondary | ICD-10-CM | POA: Diagnosis not present

## 2017-05-26 DIAGNOSIS — Q612 Polycystic kidney, adult type: Secondary | ICD-10-CM | POA: Diagnosis not present

## 2017-05-26 DIAGNOSIS — N200 Calculus of kidney: Secondary | ICD-10-CM | POA: Diagnosis not present

## 2017-05-30 ENCOUNTER — Ambulatory Visit (INDEPENDENT_AMBULATORY_CARE_PROVIDER_SITE_OTHER): Payer: Medicare Other | Admitting: Family Medicine

## 2017-05-30 ENCOUNTER — Encounter: Payer: Self-pay | Admitting: Family Medicine

## 2017-05-30 VITALS — BP 116/78 | HR 98 | Temp 98.0°F | Resp 16 | Wt 172.0 lb

## 2017-05-30 DIAGNOSIS — N2889 Other specified disorders of kidney and ureter: Secondary | ICD-10-CM

## 2017-05-30 DIAGNOSIS — I151 Hypertension secondary to other renal disorders: Secondary | ICD-10-CM

## 2017-05-30 NOTE — Progress Notes (Signed)
Patient: Megan Brock Female    DOB: 05/23/1980   37 y.o.   MRN: [id number] Visit Date: 05/30/2017  Today's Provider: Lelon Huh, MD   Chief Complaint  Patient presents with  . Hypertension    follow up   Subjective:    HPI   Hypertension, follow-up:  BP Readings from Last 3 Encounters:  03/31/17 121/82  02/23/17 130/84  02/08/17 (!) 162/102    She was last seen for hypertension 3 months ago.  BP at that visit was 140/88. Management since that visit includes; no changes.She reports good compliance with treatment. She is not having side effects.  She is exercising. She is adherent to low salt diet.   Outside blood pressures are not being checked. She is experiencing none.  Patient denies chest pain, chest pressure/discomfort, claudication, dyspnea, exertional chest pressure/discomfort, fatigue, irregular heart beat, lower extremity edema, near-syncope, orthopnea, palpitations, paroxysmal nocturnal dyspnea, syncope and tachypnea.   Cardiovascular risk factors include hypertension and smoking/ tobacco exposure.  Use of agents associated with hypertension: none.   ------------------------------------------------------------------------     No Known Allergies   Current Outpatient Medications:  .  amLODipine (NORVASC) 5 MG tablet, TAKE 1 TABLET(5 MG) BY MOUTH DAILY, Disp: 30 tablet, Rfl: 12 .  cyclobenzaprine (FLEXERIL) 10 MG tablet, Take 1 tablet (10 mg total) by mouth at bedtime., Disp: 30 tablet, Rfl: 3 .  FLUVIRIN 0.5 ML SUSY, ADM 0.5ML IM UTD, Disp: , Rfl: 0 .  HYDROcodone-acetaminophen (NORCO) 10-325 MG tablet, Take 1 tablet by mouth every 6 (six) hours as needed for severe pain., Disp: 45 tablet, Rfl: 0 .  lisinopril (PRINIVIL,ZESTRIL) 10 MG tablet, TAKE 1 TABLET(10 MG) BY MOUTH DAILY, Disp: 90 tablet, Rfl: 4 .  Morphine Sulfate ER 60 MG T12A, Take 1 tablet by mouth 3 (three) times daily., Disp: 75 tablet, Rfl: 0 .  naloxone (NARCAN) nasal spray 4  mg/0.1 mL, For excess sedation from opioids., Disp: 1 kit, Rfl: 2 .  buPROPion (WELLBUTRIN SR) 150 MG 12 hr tablet, 1 tablet daily for 3 days, then 1 tablet twice daily. Stop smoking 14 days after starting medication, Disp: 60 tablet, Rfl: 5  Review of Systems  Constitutional: Negative for appetite change, chills, fatigue and fever.  Respiratory: Negative for chest tightness and shortness of breath.   Cardiovascular: Negative for chest pain and palpitations.  Gastrointestinal: Negative for abdominal pain, nausea and vomiting.  Neurological: Negative for dizziness and weakness.    Social History   Tobacco Use  . Smoking status: Current Every Day Smoker    Packs/day: 0.75    Types: Cigarettes  . Smokeless tobacco: Never Used  . Tobacco comment: 1/2-1 pd since age 67  Substance Use Topics  . Alcohol use: No    Alcohol/week: 0.0 oz   Objective:   BP 116/78 (BP Location: Left Arm, Cuff Size: Large)   Pulse 98   Temp 98 F (36.7 C) (Oral)   Resp 16   Wt 172 lb (78 kg)   SpO2 100% Comment: room air  BMI 29.52 kg/m  Vitals:   05/30/17 1618 05/30/17 1622  BP: 118/80 116/78  Pulse: 98   Resp: 16   Temp: 98 F (36.7 C)   TempSrc: Oral   SpO2: 100%   Weight: 172 lb (78 kg)      Physical Exam   General Appearance:    Alert, cooperative, no distress  Eyes:    PERRL, conjunctiva/corneas clear, EOM's intact  Lungs:     Clear to auscultation bilaterally, respirations unlabored  Heart:    Regular rate and rhythm  Neurologic:   Awake, alert, oriented x 3. No apparent focal neurological           defect.           Assessment & Plan:     1. Hypertension secondary to other renal disorders Well controlled.  Continue current medications.    Follow up 6 months.         Lelon Huh, MD  [facility]

## 2017-06-07 ENCOUNTER — Ambulatory Visit: Payer: Medicare Other | Attending: Anesthesiology | Admitting: Anesthesiology

## 2017-06-07 ENCOUNTER — Encounter: Payer: Self-pay | Admitting: Anesthesiology

## 2017-06-07 ENCOUNTER — Other Ambulatory Visit: Payer: Self-pay

## 2017-06-07 VITALS — BP 146/79 | HR 116 | Temp 97.7°F | Ht 64.0 in | Wt 165.0 lb

## 2017-06-07 DIAGNOSIS — Q613 Polycystic kidney, unspecified: Secondary | ICD-10-CM | POA: Diagnosis not present

## 2017-06-07 DIAGNOSIS — Z79899 Other long term (current) drug therapy: Secondary | ICD-10-CM | POA: Insufficient documentation

## 2017-06-07 DIAGNOSIS — Z79891 Long term (current) use of opiate analgesic: Secondary | ICD-10-CM | POA: Diagnosis not present

## 2017-06-07 DIAGNOSIS — M5432 Sciatica, left side: Secondary | ICD-10-CM

## 2017-06-07 DIAGNOSIS — M549 Dorsalgia, unspecified: Secondary | ICD-10-CM | POA: Diagnosis present

## 2017-06-07 DIAGNOSIS — G8929 Other chronic pain: Secondary | ICD-10-CM | POA: Diagnosis not present

## 2017-06-07 DIAGNOSIS — M5136 Other intervertebral disc degeneration, lumbar region: Secondary | ICD-10-CM | POA: Insufficient documentation

## 2017-06-07 DIAGNOSIS — M5442 Lumbago with sciatica, left side: Secondary | ICD-10-CM | POA: Insufficient documentation

## 2017-06-07 DIAGNOSIS — M47816 Spondylosis without myelopathy or radiculopathy, lumbar region: Secondary | ICD-10-CM | POA: Diagnosis not present

## 2017-06-07 MED ORDER — MORPHINE SULFATE ER 60 MG PO T12A: 1.0000 | EXTENDED_RELEASE_TABLET | Freq: Three times a day (TID) | ORAL | 0 refills | Status: DC

## 2017-06-07 MED ORDER — HYDROCODONE-ACETAMINOPHEN 10-325 MG PO TABS: 1.0000 | ORAL_TABLET | Freq: Four times a day (QID) | ORAL | 0 refills | Status: DC | PRN

## 2017-06-07 NOTE — Progress Notes (Signed)
Nursing Pain Medication Assessment:  Safety precautions to be maintained throughout the outpatient stay will include: orient to surroundings, keep bed in low position, maintain call bell within reach at all times, provide assistance with transfer out of bed and ambulation.  Medication Inspection Compliance: Pill count conducted under aseptic conditions, in front of the patient. Neither the pills nor the bottle was removed from the patient's sight at any time. Once count was completed pills were immediately returned to the patient in their original bottle.  Medication #1: Hydrocodone/APAP Pill/Patch Count: 1 of 45 pills remain Pill/Patch Appearance: Markings consistent with prescribed medication Bottle Appearance: Standard pharmacy container. Clearly labeled. Filled Date: 01 / 21 / 2019 Last Medication intake:  Today  Medication #2: Morphine ER (MSContin) Pill/Patch Count: 1 of 75 pills remain Pill/Patch Appearance: Markings consistent with prescribed medication Bottle Appearance: Standard pharmacy container. Clearly labeled. Filled Date: 01 / 21 / 2019 Last Medication intake:  Today

## 2017-06-07 NOTE — Patient Instructions (Signed)
You have been given scripts for Morphine x 2 and Hydrocodone x 2

## 2017-06-08 NOTE — Progress Notes (Signed)
 Subjective:  Patient ID: Megan Brock, female    DOB: 02/22/1981  Age: 37 y.o. MRN: [id number]  CC: Back Pain   Procedure: None  HPI [name] presents for reevaluation.  She was last seen 2 months ago and continues to do well with her medication management.  The quality characteristic and distribution of her low back pain and flank pain remained the same.  Following her epidural she feels that her right leg pain has resolved but she still having some radicular type left leg pain.  This is of the same quality as previously documented.  No change in lower extremity strength or function is noted.  She has been continuing to follow with Dr. Cope.  She did have some recent x-rays showing some compression of her lumbar vertebral column with her polycystic kidney.  This is followed by Dr. Cope.  In regards to her medication management she continues to derive good functional lifestyle improvements with her medicines based on her narcotic assessment sheet.  Outpatient Medications Prior to Visit  Medication Sig Dispense Refill  . amLODipine (NORVASC) 5 MG tablet TAKE 1 TABLET(5 MG) BY MOUTH DAILY 30 tablet 12  . buPROPion (WELLBUTRIN SR) 150 MG 12 hr tablet 1 tablet daily for 3 days, then 1 tablet twice daily. Stop smoking 14 days after starting medication 60 tablet 5  . cyclobenzaprine (FLEXERIL) 10 MG tablet Take 1 tablet (10 mg total) by mouth at bedtime. 30 tablet 3  . lisinopril (PRINIVIL,ZESTRIL) 10 MG tablet TAKE 1 TABLET(10 MG) BY MOUTH DAILY 90 tablet 4  . naloxone (NARCAN) nasal spray 4 mg/0.1 mL For excess sedation from opioids. 1 kit 2  . HYDROcodone-acetaminophen (NORCO) 10-325 MG tablet Take 1 tablet by mouth every 6 (six) hours as needed for severe pain. 45 tablet 0  . Morphine Sulfate ER 60 MG T12A Take 1 tablet by mouth 3 (three) times daily. 75 tablet 0  . FLUVIRIN 0.5 ML SUSY ADM 0.5ML IM UTD  0   No facility-administered medications prior to visit.     Review of  Systems CNS: No confusion or sedation Cardiac: No angina or palpitations GI: No abdominal pain or constipation Constitutional: No nausea vomiting fevers or chills  Objective:  BP (!) 146/79   Pulse (!) 116   Temp 97.7 F (36.5 C)   Ht 5' 4" (1.626 m)   Wt 165 lb (74.8 kg)   LMP 05/31/2017   SpO2 100%   BMI 28.32 kg/m    BP Readings from Last 3 Encounters:  06/07/17 (!) 146/79  05/30/17 116/78  03/31/17 121/82     Wt Readings from Last 3 Encounters:  06/07/17 165 lb (74.8 kg)  05/30/17 172 lb (78 kg)  03/31/17 165 lb (74.8 kg)     Physical Exam Pt is alert and oriented PERRL EOMI HEART IS RRR no murmur or rub LCTA no wheezing or rales MUSCULOSKELETAL reveals some persistent paraspinous muscle tenderness as previously noted.  No significant changes are noted in lower extremity strength or function, her muscle tone and bulk are at baseline as well  Labs  No results found for: HGBA1C Lab Results  Component Value Date   LDLCALC 105 (H) 07/17/2015   CREATININE 0.98 02/08/2017    -------------------------------------------------------------------------------------------------------------------- Lab Results  Component Value Date   WBC 9.2 02/08/2017   HGB 13.2 02/08/2017   HCT 37.7 02/08/2017   PLT 197 02/08/2017   GLUCOSE 87 02/08/2017   CHOL 174 07/17/2015   TRIG 139   07/17/2015   HDL 41 07/17/2015   LDLCALC 105 (H) 07/17/2015   ALT 10 02/08/2017   AST 12 02/08/2017   NA 140 02/08/2017   K 4.0 02/08/2017   CL 105 02/08/2017   CREATININE 0.98 02/08/2017   BUN 18 02/08/2017   CO2 29 02/08/2017   TSH 1.60 02/08/2017    --------------------------------------------------------------------------------------------------------------------- Dg C-arm 1-60 Min-no Report  Result Date: 03/31/2017 Fluoroscopy was utilized by the requesting physician.  No radiographic interpretation.     Assessment & Plan:   [name] was seen today for back pain.  Diagnoses  and all orders for this visit:  Left sided sciatica  Lumbar spondylosis  Long term current use of opiate analgesic  DDD (degenerative disc disease), lumbar  Chronic bilateral low back pain with left-sided sciatica  Polycystic kidney disease  Other orders -     Discontinue: HYDROcodone-acetaminophen (NORCO) 10-325 MG tablet; Take 1 tablet by mouth every 6 (six) hours as needed for severe pain. -     HYDROcodone-acetaminophen (NORCO) 10-325 MG tablet; Take 1 tablet by mouth every 6 (six) hours as needed for severe pain. -     Discontinue: Morphine Sulfate ER 60 MG T12A; Take 1 tablet by mouth 3 (three) times daily. -     Morphine Sulfate ER 60 MG T12A; Take 1 tablet by mouth 3 (three) times daily.        ----------------------------------------------------------------------------------------------------------------------  Problem List Items Addressed This Visit      Unprioritized   Chronic lower back pain   Relevant Medications   HYDROcodone-acetaminophen (NORCO) 10-325 MG tablet   Morphine Sulfate ER 60 MG T12A   Long term current use of opiate analgesic (Chronic)   Lumbar spondylosis (Chronic)   Relevant Medications   HYDROcodone-acetaminophen (NORCO) 10-325 MG tablet   Morphine Sulfate ER 60 MG T12A   Polycystic kidney disease    Other Visit Diagnoses    Left sided sciatica    -  Primary   DDD (degenerative disc disease), lumbar       Relevant Medications   HYDROcodone-acetaminophen (NORCO) 10-325 MG tablet   Morphine Sulfate ER 60 MG T12A        ----------------------------------------------------------------------------------------------------------------------  1. Left sided sciatica We will defer on any repeat injections at this time.  I want her to continue with her stretching strengthening exercises as previously reviewed.  Return to clinic in 2 months for reevaluation.  2. Lumbar spondylosis Above  3. Long term current use of opiate analgesic She is  continued to do well with her opioid management.  We reviewed the [facility] practitioner database information and it is appropriate.  Refills were given for her hydrocodone for February 20 and March 18 as well as for her MS Contin.  4. DDD (degenerative disc disease), lumbar   5. Chronic bilateral low back pain with left-sided sciatica   6. Polycystic kidney disease Continue follow-up with Dr. Jacqlyn Larsen and her primary care physicians.    ----------------------------------------------------------------------------------------------------------------------  I am having Exie Parody. Lyster maintain her FLUVIRIN, naloxone, cyclobenzaprine, lisinopril, buPROPion, amLODipine, HYDROcodone-acetaminophen, and Morphine Sulfate ER.   Meds ordered this encounter  Medications  . DISCONTD: HYDROcodone-acetaminophen (NORCO) 10-325 MG tablet    Sig: Take 1 tablet by mouth every 6 (six) hours as needed for severe pain.    Dispense:  45 tablet    Refill:  0    Do not fill until [id number]  . HYDROcodone-acetaminophen (NORCO) 10-325 MG tablet    Sig: Take 1 tablet by mouth every  6 (six) hours as needed for severe pain.    Dispense:  45 tablet    Refill:  0    Do not fill until [id number]  . DISCONTD: Morphine Sulfate ER 60 MG T12A    Sig: Take 1 tablet by mouth 3 (three) times daily.    Dispense:  75 tablet    Refill:  0    Do not fill until [id number]  . Morphine Sulfate ER 60 MG T12A    Sig: Take 1 tablet by mouth 3 (three) times daily.    Dispense:  75 tablet    Refill:  0    Do not fill until [id number]   Patient's Medications  New Prescriptions   No medications on file  Previous Medications   AMLODIPINE (NORVASC) 5 MG TABLET    TAKE 1 TABLET(5 MG) BY MOUTH DAILY   BUPROPION (WELLBUTRIN SR) 150 MG 12 HR TABLET    1 tablet daily for 3 days, then 1 tablet twice daily. Stop smoking 14 days after starting medication   CYCLOBENZAPRINE (FLEXERIL) 10 MG TABLET    Take 1 tablet (10 mg total) by mouth  at bedtime.   FLUVIRIN 0.5 ML SUSY    ADM 0.5ML IM UTD   LISINOPRIL (PRINIVIL,ZESTRIL) 10 MG TABLET    TAKE 1 TABLET(10 MG) BY MOUTH DAILY   NALOXONE (NARCAN) NASAL SPRAY 4 MG/0.1 ML    For excess sedation from opioids.  Modified Medications   Modified Medication Previous Medication   HYDROCODONE-ACETAMINOPHEN (NORCO) 10-325 MG TABLET HYDROcodone-acetaminophen (NORCO) 10-325 MG tablet      Take 1 tablet by mouth every 6 (six) hours as needed for severe pain.    Take 1 tablet by mouth every 6 (six) hours as needed for severe pain.   MORPHINE SULFATE ER 60 MG T12A Morphine Sulfate ER 60 MG T12A      Take 1 tablet by mouth 3 (three) times daily.    Take 1 tablet by mouth 3 (three) times daily.  Discontinued Medications   No medications on file   ----------------------------------------------------------------------------------------------------------------------  Follow-up: Return in about 2 months (around 08/05/2017) for evaluation, med refill.    Molli Barrows, MD

## 2017-06-24 ENCOUNTER — Ambulatory Visit (INDEPENDENT_AMBULATORY_CARE_PROVIDER_SITE_OTHER): Payer: Medicare Other | Admitting: Family Medicine

## 2017-06-24 ENCOUNTER — Encounter: Payer: Self-pay | Admitting: Family Medicine

## 2017-06-24 VITALS — BP 126/82 | HR 102 | Temp 98.5°F | Resp 18

## 2017-06-24 DIAGNOSIS — H6690 Otitis media, unspecified, unspecified ear: Secondary | ICD-10-CM

## 2017-06-24 MED ORDER — AMOXICILLIN 500 MG PO CAPS: 1000.0000 mg | ORAL_CAPSULE | Freq: Three times a day (TID) | ORAL | 0 refills | Status: AC

## 2017-06-24 NOTE — Patient Instructions (Signed)
   You can take over the counter oral decongestants such as sudafed to relieve the pressure in you ear.    Otitis Media, Adult Otitis media is redness, soreness, and puffiness (swelling) in the space just behind your eardrum (middle ear). It may be caused by allergies or infection. It often happens along with a cold. Follow these instructions at home:  Take your medicine as told. Finish it even if you start to feel better.  Only take over-the-counter or prescription medicines for pain, discomfort, or fever as told by your doctor.  Follow up with your doctor as told. Contact a doctor if:  You have otitis media only in one ear, or bleeding from your nose, or both.  You notice a lump on your neck.  You are not getting better in 3-5 days.  You feel worse instead of better. Get help right away if:  You have pain that is not helped with medicine.  You have puffiness, redness, or pain around your ear.  You get a stiff neck.  You cannot move part of your face (paralysis).  You notice that the bone behind your ear hurts when you touch it. This information is not intended to replace advice given to you by your health care provider. Make sure you discuss any questions you have with your health care provider. Document Released: 09/22/2007 Document Revised: 09/11/2015 Document Reviewed: 10/31/2012 Elsevier Interactive Patient Education  2017 [employer]

## 2017-06-24 NOTE — Progress Notes (Signed)
Patient: Megan Brock Female    DOB: [birth year]   37 y.o.   MRN: [id number] Visit Date: 06/24/2017  Today's Provider: Lelon Huh, MD   Chief Complaint  Patient presents with  . Sinusitis   Subjective:    Sinusitis  This is a new problem. Episode onset: 4 days ago. The problem has been gradually worsening since onset. There has been no fever. Associated symptoms include congestion (sinus congestion), coughing (productive with green sputum), ear pain (right ear), headaches, a hoarse voice, neck pain, sinus pressure, sneezing, a sore throat and swollen glands (on left side of neck). Pertinent negatives include no diaphoresis or shortness of breath. Treatments tried: Day Quil cold and Flu. The treatment provided no relief.  Patient states her daughter was diagnosed with the Influenza last week.      No Known Allergies   Current Outpatient Medications:  .  amLODipine (NORVASC) 5 MG tablet, TAKE 1 TABLET(5 MG) BY MOUTH DAILY, Disp: 30 tablet, Rfl: 12 .  cyclobenzaprine (FLEXERIL) 10 MG tablet, Take 1 tablet (10 mg total) by mouth at bedtime., Disp: 30 tablet, Rfl: 3 .  FLUVIRIN 0.5 ML SUSY, ADM 0.5ML IM UTD, Disp: , Rfl: 0 .  HYDROcodone-acetaminophen (NORCO) 10-325 MG tablet, Take 1 tablet by mouth every 6 (six) hours as needed for severe pain., Disp: 45 tablet, Rfl: 0 .  lisinopril (PRINIVIL,ZESTRIL) 10 MG tablet, TAKE 1 TABLET(10 MG) BY MOUTH DAILY, Disp: 90 tablet, Rfl: 4 .  Morphine Sulfate ER 60 MG T12A, Take 1 tablet by mouth 3 (three) times daily., Disp: 75 tablet, Rfl: 0 .  naloxone (NARCAN) nasal spray 4 mg/0.1 mL, For excess sedation from opioids., Disp: 1 kit, Rfl: 2 .  buPROPion (WELLBUTRIN SR) 150 MG 12 hr tablet, 1 tablet daily for 3 days, then 1 tablet twice daily. Stop smoking 14 days after starting medication, Disp: 60 tablet, Rfl: 5  Review of Systems  Constitutional: Negative for appetite change, diaphoresis, fatigue and fever.  HENT: Positive for  congestion (sinus congestion), ear pain (right ear), hearing loss (right ear), hoarse voice, sinus pressure, sinus pain, sneezing and sore throat. Negative for ear discharge, nosebleeds and postnasal drip.   Eyes: Negative for pain, discharge and itching.  Respiratory: Positive for cough (productive with green sputum). Negative for chest tightness and shortness of breath.   Cardiovascular: Negative for chest pain and palpitations.  Gastrointestinal: Negative for abdominal pain, nausea and vomiting.  Musculoskeletal: Positive for neck pain.  Neurological: Positive for headaches. Negative for dizziness and weakness.    Social History   Tobacco Use  . Smoking status: Current Every Day Smoker    Packs/day: 0.50    Types: Cigarettes  . Smokeless tobacco: Never Used  . Tobacco comment: 1/2-1 pd since age 45  Substance Use Topics  . Alcohol use: No    Alcohol/week: 0.0 oz   Objective:   BP 126/82 (BP Location: Left Arm, Patient Position: Sitting, Cuff Size: Normal)   Pulse (!) 102   Temp 98.5 F (36.9 C) (Oral)   Resp 18   LMP 05/31/2017   SpO2 98% Comment: room air There were no vitals filed for this visit.   Physical Exam  General Appearance:    Alert, cooperative, no distress  HENT:   left TM normal without fluid or infection, right TM red, dull, bulging, right TM fluid noted, neck without nodes, throat normal without erythema or exudate, sinuses nontender and nasal mucosa pale and congested  Eyes:    PERRL, conjunctiva/corneas clear, EOM's intact       Lungs:     Clear to auscultation bilaterally, respirations unlabored  Heart:    Regular rate and rhythm  Neurologic:   Awake, alert, oriented x 3. No apparent focal neurological           defect.           Assessment & Plan:     1. Acute otitis media, unspecified otitis media type  - amoxicillin (AMOXIL) 500 MG capsule; Take 2 capsules (1,000 mg total) by mouth 3 (three) times daily for 5 days.  Dispense: 30 capsule;  Refill: 0  Call if symptoms change or if not rapidly improving.          Lelon Huh, MD  [facility]

## 2017-07-04 DIAGNOSIS — F339 Major depressive disorder, recurrent, unspecified: Secondary | ICD-10-CM | POA: Diagnosis not present

## 2017-08-01 ENCOUNTER — Ambulatory Visit: Payer: Medicare Other | Attending: Anesthesiology | Admitting: Anesthesiology

## 2017-08-01 ENCOUNTER — Encounter: Payer: Self-pay | Admitting: Anesthesiology

## 2017-08-01 ENCOUNTER — Other Ambulatory Visit: Payer: Self-pay

## 2017-08-01 VITALS — BP 117/74 | HR 102 | Temp 98.4°F | Resp 18 | Ht 64.0 in | Wt 160.0 lb

## 2017-08-01 DIAGNOSIS — Q613 Polycystic kidney, unspecified: Secondary | ICD-10-CM | POA: Insufficient documentation

## 2017-08-01 DIAGNOSIS — G894 Chronic pain syndrome: Secondary | ICD-10-CM | POA: Insufficient documentation

## 2017-08-01 DIAGNOSIS — M5442 Lumbago with sciatica, left side: Secondary | ICD-10-CM | POA: Diagnosis not present

## 2017-08-01 DIAGNOSIS — M5441 Lumbago with sciatica, right side: Secondary | ICD-10-CM | POA: Diagnosis not present

## 2017-08-01 DIAGNOSIS — M5432 Sciatica, left side: Secondary | ICD-10-CM

## 2017-08-01 DIAGNOSIS — Z79891 Long term (current) use of opiate analgesic: Secondary | ICD-10-CM | POA: Diagnosis not present

## 2017-08-01 DIAGNOSIS — M47816 Spondylosis without myelopathy or radiculopathy, lumbar region: Secondary | ICD-10-CM | POA: Diagnosis not present

## 2017-08-01 DIAGNOSIS — M545 Low back pain: Secondary | ICD-10-CM | POA: Diagnosis present

## 2017-08-01 DIAGNOSIS — F1190 Opioid use, unspecified, uncomplicated: Secondary | ICD-10-CM | POA: Diagnosis not present

## 2017-08-01 DIAGNOSIS — M5136 Other intervertebral disc degeneration, lumbar region: Secondary | ICD-10-CM | POA: Diagnosis not present

## 2017-08-01 DIAGNOSIS — G8929 Other chronic pain: Secondary | ICD-10-CM

## 2017-08-01 MED ORDER — MORPHINE SULFATE ER 60 MG PO T12A: 1.0000 | EXTENDED_RELEASE_TABLET | Freq: Three times a day (TID) | ORAL | 0 refills | Status: DC

## 2017-08-01 MED ORDER — HYDROCODONE-ACETAMINOPHEN 10-325 MG PO TABS: 1.0000 | ORAL_TABLET | Freq: Four times a day (QID) | ORAL | 0 refills | Status: DC | PRN

## 2017-08-01 NOTE — Patient Instructions (Signed)
GENERAL RISKS AND COMPLICATIONS  What are the risk, side effects and possible complications? Generally speaking, most procedures are safe.  However, with any procedure there are risks, side effects, and the possibility of complications.  The risks and complications are dependent upon the sites that are lesioned, or the type of nerve block to be performed.  The closer the procedure is to the spine, the more serious the risks are.  Great care is taken when placing the radio frequency needles, block needles or lesioning probes, but sometimes complications can occur. 1. Infection: Any time there is an injection through the skin, there is a risk of infection.  This is why sterile conditions are used for these blocks.  There are four possible types of infection. 1. Localized skin infection. 2. Central Nervous System Infection-This can be in the form of Meningitis, which can be deadly. 3. Epidural Infections-This can be in the form of an epidural abscess, which can cause pressure inside of the spine, causing compression of the spinal cord with subsequent paralysis. This would require an emergency surgery to decompress, and there are no guarantees that the patient would recover from the paralysis. 4. Discitis-This is an infection of the intervertebral discs.  It occurs in about 1% of discography procedures.  It is difficult to treat and it may lead to surgery.        2. Pain: the needles have to go through skin and soft tissues, will cause soreness.       3. Damage to internal structures:  The nerves to be lesioned may be near blood vessels or    other nerves which can be potentially damaged.       4. Bleeding: Bleeding is more common if the patient is taking blood thinners such as  aspirin, Coumadin, Ticiid, Plavix, etc., or if he/she have some genetic predisposition  such as hemophilia. Bleeding into the spinal canal can cause compression of the spinal  cord with subsequent paralysis.  This would require an  emergency surgery to  decompress and there are no guarantees that the patient would recover from the  paralysis.       5. Pneumothorax:  Puncturing of a lung is a possibility, every time a needle is introduced in  the area of the chest or upper back.  Pneumothorax refers to free air around the  collapsed lung(s), inside of the thoracic cavity (chest cavity).  Another two possible  complications related to a similar event would include: Hemothorax and Chylothorax.   These are variations of the Pneumothorax, where instead of air around the collapsed  lung(s), you may have blood or chyle, respectively.       6. Spinal headaches: They may occur with any procedures in the area of the spine.       7. Persistent CSF (Cerebro-Spinal Fluid) leakage: This is a rare problem, but may occur  with prolonged intrathecal or epidural catheters either due to the formation of a fistulous  track or a dural tear.       8. Nerve damage: By working so close to the spinal cord, there is always a possibility of  nerve damage, which could be as serious as a permanent spinal cord injury with  paralysis.       9. Death:  Although rare, severe deadly allergic reactions known as "Anaphylactic  reaction" can occur to any of the medications used.      10. Worsening of the symptoms:  We can always make thing worse.    What are the chances of something like this happening? Chances of any of this occuring are extremely low.  By statistics, you have more of a chance of getting killed in a motor vehicle accident: while driving to the hospital than any of the above occurring .  Nevertheless, you should be aware that they are possibilities.  In general, it is similar to taking a shower.  Everybody knows that you can slip, hit your head and get killed.  Does that mean that you should not shower again?  Nevertheless always keep in mind that statistics do not mean anything if you happen to be on the wrong side of them.  Even if a procedure has a 1  (one) in a 1,000,000 (million) chance of going wrong, it you happen to be that one..Also, keep in mind that by statistics, you have more of a chance of having something go wrong when taking medications.  Who should not have this procedure? If you are on a blood thinning medication (e.g. Coumadin, Plavix, see list of "Blood Thinners"), or if you have an active infection going on, you should not have the procedure.  If you are taking any blood thinners, please inform your physician.  How should I prepare for this procedure?  Do not eat or drink anything at least six hours prior to the procedure.  Bring a driver with you .  It cannot be a taxi.  Come accompanied by an adult that can drive you back, and that is strong enough to help you if your legs get weak or numb from the local anesthetic.  Take all of your medicines the morning of the procedure with just enough water to swallow them.  If you have diabetes, make sure that you are scheduled to have your procedure done first thing in the morning, whenever possible.  If you have diabetes, take only half of your insulin dose and notify our nurse that you have done so as soon as you arrive at the clinic.  If you are diabetic, but only take blood sugar pills (oral hypoglycemic), then do not take them on the morning of your procedure.  You may take them after you have had the procedure.  Do not take aspirin or any aspirin-containing medications, at least eleven (11) days prior to the procedure.  They may prolong bleeding.  Wear loose fitting clothing that may be easy to take off and that you would not mind if it got stained with Betadine or blood.  Do not wear any jewelry or perfume  Remove any nail coloring.  It will interfere with some of our monitoring equipment.  NOTE: Remember that this is not meant to be interpreted as a complete list of all possible complications.  Unforeseen problems may occur.  BLOOD THINNERS The following drugs  contain aspirin or other products, which can cause increased bleeding during surgery and should not be taken for 2 weeks prior to and 1 week after surgery.  If you should need take something for relief of minor pain, you may take acetaminophen which is found in Tylenol,m Datril, Anacin-3 and Panadol. It is not blood thinner. The products listed below are.  Do not take any of the products listed below in addition to any listed on your instruction sheet.  A.P.C or A.P.C with Codeine Codeine Phosphate Capsules #3 Ibuprofen Ridaura  ABC compound Congesprin Imuran rimadil  Advil Cope Indocin Robaxisal  Alka-Seltzer Effervescent Pain Reliever and Antacid Coricidin or Coricidin-D  Indomethacin Rufen    Alka-Seltzer plus Cold Medicine Cosprin Ketoprofen S-A-C Tablets  Anacin Analgesic Tablets or Capsules Coumadin Korlgesic Salflex  Anacin Extra Strength Analgesic tablets or capsules CP-2 Tablets Lanoril Salicylate  Anaprox Cuprimine Capsules Levenox Salocol  Anexsia-D Dalteparin Magan Salsalate  Anodynos Darvon compound Magnesium Salicylate Sine-off  Ansaid Dasin Capsules Magsal Sodium Salicylate  Anturane Depen Capsules Marnal Soma  APF Arthritis pain formula Dewitt's Pills Measurin Stanback  Argesic Dia-Gesic Meclofenamic Sulfinpyrazone  Arthritis Bayer Timed Release Aspirin Diclofenac Meclomen Sulindac  Arthritis pain formula Anacin Dicumarol Medipren Supac  Analgesic (Safety coated) Arthralgen Diffunasal Mefanamic Suprofen  Arthritis Strength Bufferin Dihydrocodeine Mepro Compound Suprol  Arthropan liquid Dopirydamole Methcarbomol with Aspirin Synalgos  ASA tablets/Enseals Disalcid Micrainin Tagament  Ascriptin Doan's Midol Talwin  Ascriptin A/D Dolene Mobidin Tanderil  Ascriptin Extra Strength Dolobid Moblgesic Ticlid  Ascriptin with Codeine Doloprin or Doloprin with Codeine Momentum Tolectin  Asperbuf Duoprin Mono-gesic Trendar  Aspergum Duradyne Motrin or Motrin IB Triminicin  Aspirin  plain, buffered or enteric coated Durasal Myochrisine Trigesic  Aspirin Suppositories Easprin Nalfon Trillsate  Aspirin with Codeine Ecotrin Regular or Extra Strength Naprosyn Uracel  Atromid-S Efficin Naproxen Ursinus  Auranofin Capsules Elmiron Neocylate Vanquish  Axotal Emagrin Norgesic Verin  Azathioprine Empirin or Empirin with Codeine Normiflo Vitamin E  Azolid Emprazil Nuprin Voltaren  Bayer Aspirin plain, buffered or children's or timed BC Tablets or powders Encaprin Orgaran Warfarin Sodium  Buff-a-Comp Enoxaparin Orudis Zorpin  Buff-a-Comp with Codeine Equegesic Os-Cal-Gesic   Buffaprin Excedrin plain, buffered or Extra Strength Oxalid   Bufferin Arthritis Strength Feldene Oxphenbutazone   Bufferin plain or Extra Strength Feldene Capsules Oxycodone with Aspirin   Bufferin with Codeine Fenoprofen Fenoprofen Pabalate or Pabalate-SF   Buffets II Flogesic Panagesic   Buffinol plain or Extra Strength Florinal or Florinal with Codeine Panwarfarin   Buf-Tabs Flurbiprofen Penicillamine   Butalbital Compound Four-way cold tablets Penicillin   Butazolidin Fragmin Pepto-Bismol   Carbenicillin Geminisyn Percodan   Carna Arthritis Reliever Geopen Persantine   Carprofen Gold's salt Persistin   Chloramphenicol Goody's Phenylbutazone   Chloromycetin Haltrain Piroxlcam   Clmetidine heparin Plaquenil   Cllnoril Hyco-pap Ponstel   Clofibrate Hydroxy chloroquine Propoxyphen         Before stopping any of these medications, be sure to consult the physician who ordered them.  Some, such as Coumadin (Warfarin) are ordered to prevent or treat serious conditions such as "deep thrombosis", "pumonary embolisms", and other heart problems.  The amount of time that you may need off of the medication may also vary with the medication and the reason for which you were taking it.  If you are taking any of these medications, please make sure you notify your pain physician before you undergo any  procedures.         Epidural Steroid Injection Patient Information  Description: The epidural space surrounds the nerves as they exit the spinal cord.  In some patients, the nerves can be compressed and inflamed by a bulging disc or a tight spinal canal (spinal stenosis).  By injecting steroids into the epidural space, we can bring irritated nerves into direct contact with a potentially helpful medication.  These steroids act directly on the irritated nerves and can reduce swelling and inflammation which often leads to decreased pain.  Epidural steroids may be injected anywhere along the spine and from the neck to the low back depending upon the location of your pain.   After numbing the skin with local anesthetic (like Novocaine), a small needle is passed   into the epidural space slowly.  You may experience a sensation of pressure while this is being done.  The entire block usually last less than 10 minutes.  Conditions which may be treated by epidural steroids:   Low back and leg pain  Neck and arm pain  Spinal stenosis  Post-laminectomy syndrome  Herpes zoster (shingles) pain  Pain from compression fractures  Preparation for the injection:  1. Do not eat any solid food or dairy products within 8 hours of your appointment.  2. You may drink clear liquids up to 3 hours before appointment.  Clear liquids include water, black coffee, juice or soda.  No milk or cream please. 3. You may take your regular medication, including pain medications, with a sip of water before your appointment  Diabetics should hold regular insulin (if taken separately) and take 1/2 normal NPH dos the morning of the procedure.  Carry some sugar containing items with you to your appointment. 4. A driver must accompany you and be prepared to drive you home after your procedure.  5. Bring all your current medications with your. 6. An IV may be inserted and sedation may be given at the discretion of the  physician.   7. A blood pressure cuff, EKG and other monitors will often be applied during the procedure.  Some patients may need to have extra oxygen administered for a short period. 8. You will be asked to provide medical information, including your allergies, prior to the procedure.  We must know immediately if you are taking blood thinners (like Coumadin/Warfarin)  Or if you are allergic to IV iodine contrast (dye). We must know if you could possible be pregnant.  Possible side-effects:  Bleeding from needle site  Infection (rare, may require surgery)  Nerve injury (rare)  Numbness & tingling (temporary)  Difficulty urinating (rare, temporary)  Spinal headache ( a headache worse with upright posture)  Light -headedness (temporary)  Pain at injection site (several days)  Decreased blood pressure (temporary)  Weakness in arm/leg (temporary)  Pressure sensation in back/neck (temporary)  Call if you experience:  Fever/chills associated with headache or increased back/neck pain.  Headache worsened by an upright position.  New onset weakness or numbness of an extremity below the injection site  Hives or difficulty breathing (go to the emergency room)  Inflammation or drainage at the infection site  Severe back/neck pain  Any new symptoms which are concerning to you  Please note:  Although the local anesthetic injected can often make your back or neck feel good for several hours after the injection, the pain will likely return.  It takes 3-7 days for steroids to work in the epidural space.  You may not notice any pain relief for at least that one week.  If effective, we will often do a series of three injections spaced 3-6 weeks apart to maximally decrease your pain.  After the initial series, we generally will wait several months before considering a repeat injection of the same type.  If you have any questions, please call [phone] [facility] 

## 2017-08-01 NOTE — Progress Notes (Signed)
Nursing Pain Medication Assessment:  Safety precautions to be maintained throughout the outpatient stay will include: orient to surroundings, keep bed in low position, maintain call bell within reach at all times, provide assistance with transfer out of bed and ambulation.  Medication Inspection Compliance: Pill count conducted under aseptic conditions, in front of the patient. Neither the pills nor the bottle was removed from the patient's sight at any time. Once count was completed pills were immediately returned to the patient in their original bottle.  Medication #1: Morphine ER (MSContin) Pill/Patch Count: 14 of 75 pills remain Pill/Patch Appearance: Markings consistent with prescribed medication Bottle Appearance: Standard pharmacy container. Clearly labeled. Filled Date: 03/19 / 2019 Last Medication intake:  Today  Medication #2: Hydrocodone/APAP Pill/Patch Count: 3 of 45 pills remain Pill/Patch Appearance: Markings consistent with prescribed medication Bottle Appearance: Standard pharmacy container. Clearly labeled. Filled Date: 03/19 / 2019 Last Medication intake:  Today

## 2017-08-01 NOTE — Progress Notes (Signed)
Subjective:  Patient ID: Megan Brock, female    DOB: [birth year]  Age: 37 y.o. MRN: [id number]  CC: Back Pain (lower)   Procedure: None  HPI CLARECE DRZEWIECKI presents for reevaluation.  She was last seen 2 months ago and has been doing well with her current regimen.  She is currently taking her medications as prescribed including MS Contin and hydrocodone.  Based on her narcotic assessment sheet she continues to derive good functional lifestyle improvement with these medications.  No side effects are noted and her only problem at this point is that she is frequently waking up at night secondary to pain and insufficient coverage throughout the evening hours.  No change in the quality characteristic or distribution of her lower back and flank pain is noted other than she has had some recurrence of her right lower extremity pain.  This responded favorably to previous epidural steroid injections.  She has similar return of symptoms to what she previously documented.  No new change in lower extremity strength or function is noted no change in bowel or bladder function is noted.  She continues to follow with Dr. Jacqlyn Larsen regarding her polycystic kidney disease.  Outpatient Medications Prior to Visit  Medication Sig Dispense Refill  . amLODipine (NORVASC) 5 MG tablet TAKE 1 TABLET(5 MG) BY MOUTH DAILY 30 tablet 12  . cyclobenzaprine (FLEXERIL) 10 MG tablet Take 1 tablet (10 mg total) by mouth at bedtime. 30 tablet 3  . FLUVIRIN 0.5 ML SUSY ADM 0.5ML IM UTD  0  . lisinopril (PRINIVIL,ZESTRIL) 10 MG tablet TAKE 1 TABLET(10 MG) BY MOUTH DAILY 90 tablet 4  . naloxone (NARCAN) nasal spray 4 mg/0.1 mL For excess sedation from opioids. 1 kit 2  . HYDROcodone-acetaminophen (NORCO) 10-325 MG tablet Take 1 tablet by mouth every 6 (six) hours as needed for severe pain. 45 tablet 0  . Morphine Sulfate ER 60 MG T12A Take 1 tablet by mouth 3 (three) times daily. 75 tablet 0  . buPROPion (WELLBUTRIN SR) 150 MG 12 hr  tablet 1 tablet daily for 3 days, then 1 tablet twice daily. Stop smoking 14 days after starting medication 60 tablet 5   No facility-administered medications prior to visit.     Review of Systems CNS: No confusion or sedation Cardiac: No angina or palpitations GI: No abdominal pain or constipation Constitutional: No nausea vomiting fevers or chills  Objective:  BP 117/74   Pulse (!) 102   Temp 98.4 F (36.9 C) (Oral)   Resp 18   Ht 5' 4" (1.626 m)   Wt 160 lb (72.6 kg)   SpO2 100%   BMI 27.46 kg/m    BP Readings from Last 3 Encounters:  08/01/17 117/74  06/24/17 126/82  06/07/17 (!) 146/79     Wt Readings from Last 3 Encounters:  08/01/17 160 lb (72.6 kg)  06/07/17 165 lb (74.8 kg)  05/30/17 172 lb (78 kg)     Physical Exam Pt is alert and oriented PERRL EOMI HEART IS RRR no murmur or rub LCTA no wheezing or rales MUSCULOSKELETAL reveals some paraspinous muscle tenderness but no trigger points noted.  She continues to have right greater than left lower extremity radicular type pain and her muscle tone and bulk is at baseline  Labs  No results found for: HGBA1C Lab Results  Component Value Date   LDLCALC 105 (H) 07/17/2015   CREATININE 0.98 02/08/2017    -------------------------------------------------------------------------------------------------------------------- Lab Results  Component Value Date  WBC 9.2 02/08/2017   HGB 13.2 02/08/2017   HCT 37.7 02/08/2017   PLT 197 02/08/2017   GLUCOSE 87 02/08/2017   CHOL 174 07/17/2015   TRIG 139 07/17/2015   HDL 41 07/17/2015   LDLCALC 105 (H) 07/17/2015   ALT 10 02/08/2017   AST 12 02/08/2017   NA 140 02/08/2017   K 4.0 02/08/2017   CL 105 02/08/2017   CREATININE 0.98 02/08/2017   BUN 18 02/08/2017   CO2 29 02/08/2017   TSH 1.60 02/08/2017    --------------------------------------------------------------------------------------------------------------------- Dg C-arm 1-60 Min-no  Report  Result Date: 03/31/2017 Fluoroscopy was utilized by the requesting physician.  No radiographic interpretation.     Assessment & Plan:   Shirel was seen today for back pain.  Diagnoses and all orders for this visit:  Left sided sciatica  Lumbar spondylosis -     Lumbar Epidural Injection; Future  Long term current use of opiate analgesic  DDD (degenerative disc disease), lumbar -     Lumbar Epidural Injection; Future  Polycystic kidney disease  Chronic pain syndrome  Chronic, continuous use of opioids  Chronic bilateral low back pain with right-sided sciatica -     Lumbar Epidural Injection; Future  Other orders -     Discontinue: Morphine Sulfate ER 60 MG T12A; Take 1 tablet by mouth 3 (three) times daily. -     Morphine Sulfate ER 60 MG T12A; Take 1 tablet by mouth 3 (three) times daily. -     Discontinue: HYDROcodone-acetaminophen (NORCO) 10-325 MG tablet; Take 1 tablet by mouth every 6 (six) hours as needed for severe pain. -     HYDROcodone-acetaminophen (NORCO) 10-325 MG tablet; Take 1 tablet by mouth every 6 (six) hours as needed for severe pain.        ----------------------------------------------------------------------------------------------------------------------  Problem List Items Addressed This Visit      Unprioritized   Chronic lower back pain   Relevant Medications   Morphine Sulfate ER 60 MG T12A   HYDROcodone-acetaminophen (NORCO) 10-325 MG tablet   Other Relevant Orders   Lumbar Epidural Injection   Chronic pain syndrome (Chronic)   Chronic, continuous use of opioids   Long term current use of opiate analgesic (Chronic)   Lumbar spondylosis (Chronic)   Relevant Medications   Morphine Sulfate ER 60 MG T12A   HYDROcodone-acetaminophen (NORCO) 10-325 MG tablet   Other Relevant Orders   Lumbar Epidural Injection   Polycystic kidney disease    Other Visit Diagnoses    Left sided sciatica    -  Primary   DDD (degenerative disc  disease), lumbar       Relevant Medications   Morphine Sulfate ER 60 MG T12A   HYDROcodone-acetaminophen (NORCO) 10-325 MG tablet   Other Relevant Orders   Lumbar Epidural Injection        ----------------------------------------------------------------------------------------------------------------------  1. Left sided sciatica I am going to schedule her for a repeat lumbar epidural steroid at her next visit.  We have gone over the risks and benefits of the procedure with her in full detail.  She has responded with these favorably in the past and unfortunately her degenerative disc disease and sciatica remained quite recalcitrant to conservative therapy.  Is failed physical therapy.  2. Lumbar spondylosis As above - Lumbar Epidural Injection; Future  3. Long term current use of opiate analgesic We reviewed the [facility] practitioner database information and it is appropriate.  Refills were given for April 17 and May 17 for both the MS  Contin and the hydrocodone.  I have also suggested that she take her MS Contin later in the evening to see if she may get better coverage overnight.  4. DDD (degenerative disc disease), lumbar  - Lumbar Epidural Injection; Future  5. Polycystic kidney disease Follow-up with Dr. Jacqlyn Larsen  6. Chronic pain syndrome   7. Chronic, continuous use of opioids   8. Chronic bilateral low back pain with right-sided sciatica As above - Lumbar Epidural Injection; Future    ----------------------------------------------------------------------------------------------------------------------  I am having Exie Parody. Pruss maintain her FLUVIRIN, naloxone, cyclobenzaprine, lisinopril, buPROPion, amLODipine, Morphine Sulfate ER, and HYDROcodone-acetaminophen.   Meds ordered this encounter  Medications  . DISCONTD: Morphine Sulfate ER 60 MG T12A    Sig: Take 1 tablet by mouth 3 (three) times daily.    Dispense:  75 tablet    Refill:  0    Do not fill  until [id number]  . Morphine Sulfate ER 60 MG T12A    Sig: Take 1 tablet by mouth 3 (three) times daily.    Dispense:  75 tablet    Refill:  0    Do not fill until [id number]  . DISCONTD: HYDROcodone-acetaminophen (NORCO) 10-325 MG tablet    Sig: Take 1 tablet by mouth every 6 (six) hours as needed for severe pain.    Dispense:  45 tablet    Refill:  0    Do not fill until [id number]  . HYDROcodone-acetaminophen (NORCO) 10-325 MG tablet    Sig: Take 1 tablet by mouth every 6 (six) hours as needed for severe pain.    Dispense:  45 tablet    Refill:  0    Do not fill until [id number]   Patient's Medications  New Prescriptions   No medications on file  Previous Medications   AMLODIPINE (NORVASC) 5 MG TABLET    TAKE 1 TABLET(5 MG) BY MOUTH DAILY   BUPROPION (WELLBUTRIN SR) 150 MG 12 HR TABLET    1 tablet daily for 3 days, then 1 tablet twice daily. Stop smoking 14 days after starting medication   CYCLOBENZAPRINE (FLEXERIL) 10 MG TABLET    Take 1 tablet (10 mg total) by mouth at bedtime.   FLUVIRIN 0.5 ML SUSY    ADM 0.5ML IM UTD   LISINOPRIL (PRINIVIL,ZESTRIL) 10 MG TABLET    TAKE 1 TABLET(10 MG) BY MOUTH DAILY   NALOXONE (NARCAN) NASAL SPRAY 4 MG/0.1 ML    For excess sedation from opioids.  Modified Medications   Modified Medication Previous Medication   HYDROCODONE-ACETAMINOPHEN (NORCO) 10-325 MG TABLET HYDROcodone-acetaminophen (NORCO) 10-325 MG tablet      Take 1 tablet by mouth every 6 (six) hours as needed for severe pain.    Take 1 tablet by mouth every 6 (six) hours as needed for severe pain.   MORPHINE SULFATE ER 60 MG T12A Morphine Sulfate ER 60 MG T12A      Take 1 tablet by mouth 3 (three) times daily.    Take 1 tablet by mouth 3 (three) times daily.  Discontinued Medications   No medications on file   ----------------------------------------------------------------------------------------------------------------------  Follow-up: Return in about 2 months (around 10/01/2017) for  evaluation, med refill, procedure.    Molli Barrows, MD None

## 2017-09-18 ENCOUNTER — Other Ambulatory Visit: Payer: Self-pay | Admitting: Family Medicine

## 2017-09-18 DIAGNOSIS — Z720 Tobacco use: Secondary | ICD-10-CM

## 2017-09-22 ENCOUNTER — Other Ambulatory Visit: Payer: Self-pay | Admitting: Anesthesiology

## 2017-09-27 ENCOUNTER — Ambulatory Visit: Payer: Medicare Other | Admitting: Anesthesiology

## 2017-09-29 ENCOUNTER — Other Ambulatory Visit: Payer: Self-pay

## 2017-09-29 ENCOUNTER — Ambulatory Visit: Payer: Medicare Other | Attending: Anesthesiology | Admitting: Anesthesiology

## 2017-09-29 ENCOUNTER — Encounter: Payer: Self-pay | Admitting: Anesthesiology

## 2017-09-29 VITALS — BP 132/81 | HR 112 | Temp 98.6°F | Resp 18 | Ht 64.0 in | Wt 160.0 lb

## 2017-09-29 DIAGNOSIS — M5136 Other intervertebral disc degeneration, lumbar region: Secondary | ICD-10-CM

## 2017-09-29 DIAGNOSIS — F1190 Opioid use, unspecified, uncomplicated: Secondary | ICD-10-CM | POA: Diagnosis not present

## 2017-09-29 DIAGNOSIS — M5442 Lumbago with sciatica, left side: Secondary | ICD-10-CM | POA: Insufficient documentation

## 2017-09-29 DIAGNOSIS — G894 Chronic pain syndrome: Secondary | ICD-10-CM | POA: Insufficient documentation

## 2017-09-29 DIAGNOSIS — G8929 Other chronic pain: Secondary | ICD-10-CM

## 2017-09-29 DIAGNOSIS — Q613 Polycystic kidney, unspecified: Secondary | ICD-10-CM | POA: Diagnosis not present

## 2017-09-29 DIAGNOSIS — Z79891 Long term (current) use of opiate analgesic: Secondary | ICD-10-CM | POA: Diagnosis not present

## 2017-09-29 DIAGNOSIS — Z79899 Other long term (current) drug therapy: Secondary | ICD-10-CM | POA: Insufficient documentation

## 2017-09-29 DIAGNOSIS — M47896 Other spondylosis, lumbar region: Secondary | ICD-10-CM | POA: Insufficient documentation

## 2017-09-29 DIAGNOSIS — M5432 Sciatica, left side: Secondary | ICD-10-CM | POA: Diagnosis not present

## 2017-09-29 DIAGNOSIS — M47816 Spondylosis without myelopathy or radiculopathy, lumbar region: Secondary | ICD-10-CM

## 2017-09-29 DIAGNOSIS — R109 Unspecified abdominal pain: Secondary | ICD-10-CM | POA: Insufficient documentation

## 2017-09-29 MED ORDER — MORPHINE SULFATE ER 60 MG PO T12A: 1.0000 | EXTENDED_RELEASE_TABLET | Freq: Three times a day (TID) | ORAL | 0 refills | Status: DC

## 2017-09-29 MED ORDER — CYCLOBENZAPRINE HCL 10 MG PO TABS: 10.0000 mg | ORAL_TABLET | Freq: Every day | ORAL | 3 refills | Status: DC

## 2017-09-29 MED ORDER — HYDROCODONE-ACETAMINOPHEN 10-325 MG PO TABS: 1.0000 | ORAL_TABLET | Freq: Four times a day (QID) | ORAL | 0 refills | Status: DC | PRN

## 2017-09-29 NOTE — Patient Instructions (Addendum)
You have been given prescription today for Norco and Morphine.

## 2017-09-29 NOTE — Progress Notes (Signed)
Nursing Pain Medication Assessment:  Safety precautions to be maintained throughout the outpatient stay will include: orient to surroundings, keep bed in low position, maintain call bell within reach at all times, provide assistance with transfer out of bed and ambulation.  Medication Inspection Compliance: Pill count conducted under aseptic conditions, in front of the patient. Neither the pills nor the bottle was removed from the patient's sight at any time. Once count was completed pills were immediately returned to the patient in their original bottle.  Medication #1: Morphine ER (MSContin) Pill/Patch Count: 9 of 75 pills remain Pill/Patch Appearance: Markings consistent with prescribed medication Bottle Appearance: Standard pharmacy container. Clearly labeled. Filled Date: 05 / 17 / 2019 Last Medication intake:  Today  Medication #2: Hydrocodone/APAP Pill/Patch Count: 4 of 45 pills remain Pill/Patch Appearance: Markings consistent with prescribed medication Bottle Appearance: Standard pharmacy container. Clearly labeled. Filled Date: 05 / 17 / 2019 Last Medication intake:  Today

## 2017-10-01 NOTE — Progress Notes (Signed)
Subjective:  Patient ID: Megan Brock, female    DOB: 1980-05-10  Age: 37 y.o. MRN: [id number]  CC: Back Pain (low) and Abdominal Pain (right upper)   Procedure: None  HPI Megan Brock presents for reevaluation.  She was last seen 2 months ago and continues to do well with her medication management.  The quality characteristic distribution of her low back pain and leg pain has been stable in nature.  She has had some more significant left posterior lateral leg pain with calf pain.  She is had previous epidurals in the past and this controlled her right side low back pain and leg pain however she is having more consistent problems in the left side and some slightly recurrence of pain on the right.  She is tolerating her medications well and continues to derive good functional lifestyle improvement with her medications based on her history and narcotic assessment sheet.  She also continues to follow-up with Dr. Jacqlyn Larsen for her baseline polycystic kidney disease follow-up.  Outpatient Medications Prior to Visit  Medication Sig Dispense Refill  . amLODipine (NORVASC) 5 MG tablet TAKE 1 TABLET(5 MG) BY MOUTH DAILY 30 tablet 12  . buPROPion (WELLBUTRIN SR) 150 MG 12 hr tablet Take 1 tablet (150 mg total) by mouth 2 (two) times daily. 60 tablet 5  . FLUVIRIN 0.5 ML SUSY ADM 0.5ML IM UTD  0  . lisinopril (PRINIVIL,ZESTRIL) 10 MG tablet TAKE 1 TABLET(10 MG) BY MOUTH DAILY 90 tablet 4  . naloxone (NARCAN) nasal spray 4 mg/0.1 mL For excess sedation from opioids. 1 kit 2  . cyclobenzaprine (FLEXERIL) 10 MG tablet Take 1 tablet (10 mg total) by mouth at bedtime. 30 tablet 3  . HYDROcodone-acetaminophen (NORCO) 10-325 MG tablet Take 1 tablet by mouth every 6 (six) hours as needed for severe pain. 45 tablet 0  . Morphine Sulfate ER 60 MG T12A Take 1 tablet by mouth 3 (three) times daily. 75 tablet 0   No facility-administered medications prior to visit.     Review of Systems CNS: No confusion or  sedation Cardiac: No angina or palpitations GI: No abdominal pain or constipation Constitutional: No nausea vomiting fevers or chills  Objective:  BP 132/81   Pulse (!) 112   Temp 98.6 F (37 C) (Oral)   Resp 18   Ht 5' 4"  (1.626 m)   Wt 160 lb (72.6 kg)   SpO2 100%   BMI 27.46 kg/m    BP Readings from Last 3 Encounters:  09/29/17 132/81  08/01/17 117/74  06/24/17 126/82     Wt Readings from Last 3 Encounters:  09/29/17 160 lb (72.6 kg)  08/01/17 160 lb (72.6 kg)  06/07/17 165 lb (74.8 kg)     Physical Exam Pt is alert and oriented PERRL EOMI HEART IS RRR no murmur or rub LCTA no wheezing or rales MUSCULOSKELETAL reveals some paraspinous muscle tenderness and flank pain bilaterally consistent with her baseline examination.  Her muscle tone and bulk to the lower extremities is also at baseline.  She does have findings consistent with a radiculitis on the right side at L5.  This is also present left side as well.  Labs  No results found for: HGBA1C Lab Results  Component Value Date   LDLCALC 105 (H) 07/17/2015   CREATININE 0.98 02/08/2017    -------------------------------------------------------------------------------------------------------------------- Lab Results  Component Value Date   WBC 9.2 02/08/2017   HGB 13.2 02/08/2017   HCT 37.7 02/08/2017   PLT 197  02/08/2017   GLUCOSE 87 02/08/2017   CHOL 174 07/17/2015   TRIG 139 07/17/2015   HDL 41 07/17/2015   LDLCALC 105 (H) 07/17/2015   ALT 10 02/08/2017   AST 12 02/08/2017   NA 140 02/08/2017   K 4.0 02/08/2017   CL 105 02/08/2017   CREATININE 0.98 02/08/2017   BUN 18 02/08/2017   CO2 29 02/08/2017   TSH 1.60 02/08/2017    --------------------------------------------------------------------------------------------------------------------- Dg C-arm 1-60 Min-no Report  Result Date: 03/31/2017 Fluoroscopy was utilized by the requesting physician.  No radiographic interpretation.      Assessment & Plan:   Megan Brock was seen today for back pain and abdominal pain.  Diagnoses and all orders for this visit:  Left sided sciatica  Lumbar spondylosis  DDD (degenerative disc disease), lumbar  Polycystic kidney disease  Chronic pain syndrome -     ToxASSURE Select 13 (MW), Urine  Chronic, continuous use of opioids -     ToxASSURE Select 13 (MW), Urine  Chronic bilateral low back pain with left-sided sciatica  Other orders -     Discontinue: HYDROcodone-acetaminophen (NORCO) 10-325 MG tablet; Take 1 tablet by mouth every 6 (six) hours as needed for severe pain. -     HYDROcodone-acetaminophen (NORCO) 10-325 MG tablet; Take 1 tablet by mouth every 6 (six) hours as needed for severe pain. -     Discontinue: Morphine Sulfate ER 60 MG T12A; Take 1 tablet by mouth 3 (three) times daily. -     Morphine Sulfate ER 60 MG T12A; Take 1 tablet by mouth 3 (three) times daily. -     cyclobenzaprine (FLEXERIL) 10 MG tablet; Take 1 tablet (10 mg total) by mouth at bedtime.        ----------------------------------------------------------------------------------------------------------------------  Problem List Items Addressed This Visit      Unprioritized   Chronic lower back pain   Relevant Medications   HYDROcodone-acetaminophen (NORCO) 10-325 MG tablet   Morphine Sulfate ER 60 MG T12A   cyclobenzaprine (FLEXERIL) 10 MG tablet   Chronic pain syndrome (Chronic)   Relevant Orders   ToxASSURE Select 13 (MW), Urine   Chronic, continuous use of opioids   Relevant Orders   ToxASSURE Select 13 (MW), Urine   Lumbar spondylosis (Chronic)   Relevant Medications   HYDROcodone-acetaminophen (NORCO) 10-325 MG tablet   Morphine Sulfate ER 60 MG T12A   cyclobenzaprine (FLEXERIL) 10 MG tablet   Polycystic kidney disease    Other Visit Diagnoses    Left sided sciatica    -  Primary   Relevant Medications   cyclobenzaprine (FLEXERIL) 10 MG tablet   DDD (degenerative disc  disease), lumbar       Relevant Medications   HYDROcodone-acetaminophen (NORCO) 10-325 MG tablet   Morphine Sulfate ER 60 MG T12A   cyclobenzaprine (FLEXERIL) 10 MG tablet        ----------------------------------------------------------------------------------------------------------------------  1. Left sided sciatica We will have her return to clinic in approximately a month or so for a repeat epidural injection.  We have gone over the risks and benefits of this with her once again.  I want her to continue with her core stretching strengthening exercises as well.  2. Lumbar spondylosis As above  3. DDD (degenerative disc disease), lumbar As above  4. Polycystic kidney disease You follow-up with Dr. Jacqlyn Larsen  5. Chronic pain syndrome We have reviewed the [facility] practitioner database information and it is appropriate.  Refills will be given for June 16 and July 16 for both  her opioid medications the hydrocodone and MS Contin. - ToxASSURE Select 13 (MW), Urine  6. Chronic, continuous use of opioids As above - ToxASSURE Select 13 (MW), Urine  7. Chronic bilateral low back pain with left-sided sciatica As above    ----------------------------------------------------------------------------------------------------------------------  I am having Megan Brock. Bouska maintain her FLUVIRIN, naloxone, lisinopril, amLODipine, buPROPion, HYDROcodone-acetaminophen, Morphine Sulfate ER, and cyclobenzaprine.   Meds ordered this encounter  Medications  . DISCONTD: HYDROcodone-acetaminophen (NORCO) 10-325 MG tablet    Sig: Take 1 tablet by mouth every 6 (six) hours as needed for severe pain.    Dispense:  45 tablet    Refill:  0    Do not fill until [id number]  . HYDROcodone-acetaminophen (NORCO) 10-325 MG tablet    Sig: Take 1 tablet by mouth every 6 (six) hours as needed for severe pain.    Dispense:  45 tablet    Refill:  0    Do not fill until [id number]  . DISCONTD: Morphine  Sulfate ER 60 MG T12A    Sig: Take 1 tablet by mouth 3 (three) times daily.    Dispense:  75 tablet    Refill:  0    Do not fill until [id number]  . Morphine Sulfate ER 60 MG T12A    Sig: Take 1 tablet by mouth 3 (three) times daily.    Dispense:  75 tablet    Refill:  0    Do not fill until [id number]  . cyclobenzaprine (FLEXERIL) 10 MG tablet    Sig: Take 1 tablet (10 mg total) by mouth at bedtime.    Dispense:  30 tablet    Refill:  3   Patient's Medications  New Prescriptions   No medications on file  Previous Medications   AMLODIPINE (NORVASC) 5 MG TABLET    TAKE 1 TABLET(5 MG) BY MOUTH DAILY   BUPROPION (WELLBUTRIN SR) 150 MG 12 HR TABLET    Take 1 tablet (150 mg total) by mouth 2 (two) times daily.   FLUVIRIN 0.5 ML SUSY    ADM 0.5ML IM UTD   LISINOPRIL (PRINIVIL,ZESTRIL) 10 MG TABLET    TAKE 1 TABLET(10 MG) BY MOUTH DAILY   NALOXONE (NARCAN) NASAL SPRAY 4 MG/0.1 ML    For excess sedation from opioids.  Modified Medications   Modified Medication Previous Medication   CYCLOBENZAPRINE (FLEXERIL) 10 MG TABLET cyclobenzaprine (FLEXERIL) 10 MG tablet      Take 1 tablet (10 mg total) by mouth at bedtime.    Take 1 tablet (10 mg total) by mouth at bedtime.   HYDROCODONE-ACETAMINOPHEN (NORCO) 10-325 MG TABLET HYDROcodone-acetaminophen (NORCO) 10-325 MG tablet      Take 1 tablet by mouth every 6 (six) hours as needed for severe pain.    Take 1 tablet by mouth every 6 (six) hours as needed for severe pain.   MORPHINE SULFATE ER 60 MG T12A Morphine Sulfate ER 60 MG T12A      Take 1 tablet by mouth 3 (three) times daily.    Take 1 tablet by mouth 3 (three) times daily.  Discontinued Medications   No medications on file   ----------------------------------------------------------------------------------------------------------------------  Follow-up: Return in about 2 months (around 11/29/2017) for evaluation, med refill.    Molli Barrows, MD

## 2017-10-03 ENCOUNTER — Telehealth: Payer: Self-pay | Admitting: Family Medicine

## 2017-10-03 DIAGNOSIS — N200 Calculus of kidney: Secondary | ICD-10-CM

## 2017-10-03 DIAGNOSIS — Q613 Polycystic kidney, unspecified: Secondary | ICD-10-CM

## 2017-10-03 DIAGNOSIS — F339 Major depressive disorder, recurrent, unspecified: Secondary | ICD-10-CM | POA: Diagnosis not present

## 2017-10-03 NOTE — Telephone Encounter (Signed)
Please advise 

## 2017-10-03 NOTE — Telephone Encounter (Signed)
Pt stated that she had been seeing Dr. Achilles Dunkope for her kidney issue but Dr. Achilles Dunkope is leaving the office and his office is recommending pt see a Nephrologist because they don't have anyone in the office to care for pt's kidney needs. Pt is open to seeing whom ever Dr. Sherrie MustacheFisher recommends. Pt would like [facility] and prefers afternoon or mid day appts. Please advise. Thanks TNP

## 2017-10-05 LAB — TOXASSURE SELECT 13 (MW), URINE

## 2017-10-12 NOTE — Telephone Encounter (Signed)
Urology. She has a structural abnormality with her kidneys, not a functional abnormality.

## 2017-10-12 NOTE — Telephone Encounter (Signed)
Please clarify whether pt needs referral to urology or nephrology?

## 2017-10-13 NOTE — Telephone Encounter (Signed)
[facility]arcelino [facility] had called for clarification. Left detailed message on her vm. [phone].

## 2017-10-18 ENCOUNTER — Encounter: Payer: Self-pay | Admitting: Anesthesiology

## 2017-10-18 ENCOUNTER — Ambulatory Visit (HOSPITAL_BASED_OUTPATIENT_CLINIC_OR_DEPARTMENT_OTHER): Payer: Medicare Other | Admitting: Anesthesiology

## 2017-10-18 ENCOUNTER — Other Ambulatory Visit: Payer: Self-pay

## 2017-10-18 ENCOUNTER — Ambulatory Visit
Admission: RE | Admit: 2017-10-18 | Discharge: 2017-10-18 | Disposition: A | Discharge status: Home / Self Care | Payer: Medicare Other | Source: Ambulatory Visit | Attending: Anesthesiology | Admitting: Anesthesiology

## 2017-10-18 ENCOUNTER — Other Ambulatory Visit: Payer: Self-pay | Admitting: Anesthesiology

## 2017-10-18 VITALS — BP 119/97 | HR 111 | Temp 98.4°F | Resp 21 | Ht 64.0 in | Wt 165.0 lb

## 2017-10-18 DIAGNOSIS — R52 Pain, unspecified: Secondary | ICD-10-CM

## 2017-10-18 DIAGNOSIS — Z79891 Long term (current) use of opiate analgesic: Secondary | ICD-10-CM | POA: Insufficient documentation

## 2017-10-18 DIAGNOSIS — M47816 Spondylosis without myelopathy or radiculopathy, lumbar region: Secondary | ICD-10-CM | POA: Diagnosis not present

## 2017-10-18 DIAGNOSIS — M5432 Sciatica, left side: Secondary | ICD-10-CM | POA: Diagnosis not present

## 2017-10-18 DIAGNOSIS — M5116 Intervertebral disc disorders with radiculopathy, lumbar region: Secondary | ICD-10-CM | POA: Diagnosis not present

## 2017-10-18 DIAGNOSIS — G894 Chronic pain syndrome: Secondary | ICD-10-CM | POA: Diagnosis not present

## 2017-10-18 DIAGNOSIS — Q613 Polycystic kidney, unspecified: Secondary | ICD-10-CM | POA: Insufficient documentation

## 2017-10-18 DIAGNOSIS — Z79899 Other long term (current) drug therapy: Secondary | ICD-10-CM | POA: Insufficient documentation

## 2017-10-18 DIAGNOSIS — M5441 Lumbago with sciatica, right side: Secondary | ICD-10-CM

## 2017-10-18 DIAGNOSIS — G8929 Other chronic pain: Secondary | ICD-10-CM

## 2017-10-18 DIAGNOSIS — M5136 Other intervertebral disc degeneration, lumbar region: Secondary | ICD-10-CM

## 2017-10-18 DIAGNOSIS — M549 Dorsalgia, unspecified: Secondary | ICD-10-CM | POA: Diagnosis present

## 2017-10-18 DIAGNOSIS — F1190 Opioid use, unspecified, uncomplicated: Secondary | ICD-10-CM

## 2017-10-18 MED ORDER — IOPAMIDOL (ISOVUE-M 200) INJECTION 41%
20.0000 mL | Freq: Once | INTRAMUSCULAR | Status: DC | PRN
  Administered 2017-10-18: 10 mL
  Filled 2017-10-18: qty 20

## 2017-10-18 MED ORDER — TRIAMCINOLONE ACETONIDE 40 MG/ML IJ SUSP
INTRAMUSCULAR | Status: AC
  Filled 2017-10-18: qty 1

## 2017-10-18 MED ORDER — LIDOCAINE HCL (PF) 1 % IJ SOLN
5.0000 mL | Freq: Once | INTRAMUSCULAR | Status: AC
  Administered 2017-10-18: 10 mL via SUBCUTANEOUS

## 2017-10-18 MED ORDER — ROPIVACAINE HCL 2 MG/ML IJ SOLN
10.0000 mL | Freq: Once | INTRAMUSCULAR | Status: AC
  Administered 2017-10-18: 10 mL via EPIDURAL

## 2017-10-18 MED ORDER — SODIUM CHLORIDE 0.9% FLUSH
10.0000 mL | Freq: Once | INTRAVENOUS | Status: AC
  Administered 2017-10-18: 10 mL

## 2017-10-18 MED ORDER — LIDOCAINE HCL (PF) 1 % IJ SOLN
INTRAMUSCULAR | Status: AC
  Filled 2017-10-18: qty 10

## 2017-10-18 MED ORDER — SODIUM CHLORIDE 0.9 % IJ SOLN
INTRAMUSCULAR | Status: AC
  Filled 2017-10-18: qty 10

## 2017-10-18 MED ORDER — IOPAMIDOL (ISOVUE-M 200) INJECTION 41%
INTRAMUSCULAR | Status: AC
  Filled 2017-10-18: qty 10

## 2017-10-18 MED ORDER — ROPIVACAINE HCL 2 MG/ML IJ SOLN
INTRAMUSCULAR | Status: AC
  Filled 2017-10-18: qty 10

## 2017-10-18 MED ORDER — TRIAMCINOLONE ACETONIDE 40 MG/ML IJ SUSP
40.0000 mg | Freq: Once | INTRAMUSCULAR | Status: AC
  Administered 2017-10-18: 40 mg

## 2017-10-18 NOTE — Progress Notes (Signed)
Safety precautions to be maintained throughout the outpatient stay will include: orient to surroundings, keep bed in low position, maintain call bell within reach at all times, provide assistance with transfer out of bed and ambulation.  

## 2017-10-19 NOTE — Progress Notes (Signed)
Subjective:  Patient ID: Megan Brock, female    DOB: September 23, 1980  Age: 37 y.o. MRN: [id number]  CC: Back Pain   Procedure: L5-S1 epidural steroid under fluoroscopic guidance   HPI Megan Brock presents for reevaluation.  She continues to have primarily left greater than right low back pain with radiation in the bilateral calves left side greater than right.  The quality characteristic and distribution is as previously documented.  She is not having any weakness.  She is had previous epidural injections that have eliminated her right lower leg pain.  Most of her pain at this point is on the left with left calf involvement.  Her bowel bladder function has been stable.  She has been seen by Dr. Jacqlyn Larsen recently and is currently looking for a nephrologist urologist to continue to monitor her polycystic kidney disease and chronic pain.  Outpatient Medications Prior to Visit  Medication Sig Dispense Refill  . amLODipine (NORVASC) 5 MG tablet TAKE 1 TABLET(5 MG) BY MOUTH DAILY 30 tablet 12  . buPROPion (WELLBUTRIN SR) 150 MG 12 hr tablet Take 1 tablet (150 mg total) by mouth 2 (two) times daily. 60 tablet 5  . cyclobenzaprine (FLEXERIL) 10 MG tablet Take 1 tablet (10 mg total) by mouth at bedtime. 30 tablet 3  . HYDROcodone-acetaminophen (NORCO) 10-325 MG tablet Take 1 tablet by mouth every 6 (six) hours as needed for severe pain. 45 tablet 0  . lisinopril (PRINIVIL,ZESTRIL) 10 MG tablet TAKE 1 TABLET(10 MG) BY MOUTH DAILY 90 tablet 4  . Morphine Sulfate ER 60 MG T12A Take 1 tablet by mouth 3 (three) times daily. 75 tablet 0  . naloxone (NARCAN) nasal spray 4 mg/0.1 mL For excess sedation from opioids. 1 kit 2  . FLUVIRIN 0.5 ML SUSY ADM 0.5ML IM UTD  0   No facility-administered medications prior to visit.     Review of Systems CNS: No confusion or sedation Cardiac: No angina or palpitations GI: No abdominal pain or constipation Constitutional: No nausea vomiting fevers or  chills  Objective:  BP (!) 119/97   Pulse (!) 111   Temp 98.4 F (36.9 C)   Resp (!) 21   Ht _0  (1.626 m)   Wt 165 lb (74.8 kg)   LMP  (LMP Unknown)   SpO2 100%   BMI 28.32 kg/m    BP Readings from Last 3 Encounters:  10/18/17 (!) 119/97  09/29/17 132/81  08/01/17 117/74     Wt Readings from Last 3 Encounters:  10/18/17 165 lb (74.8 kg)  09/29/17 160 lb (72.6 kg)  08/01/17 160 lb (72.6 kg)     Physical Exam Pt is alert and oriented PERRL EOMI HEART IS RRR no murmur or rub LCTA no wheezing or rales MUSCULOSKELETAL reveals some left and right posterior flank tenderness consistent with previous evaluation.  Her strength in the lower extremities is well-preserved as is her muscle tone and bulk.  Has a positive straight leg raise on the left side negative on the right with minimal pain on extension at the low back with standing position.  Labs  No results found for: HGBA1C Lab Results  Component Value Date   LDLCALC 105 (H) 07/17/2015   CREATININE 0.98 02/08/2017    -------------------------------------------------------------------------------------------------------------------- Lab Results  Component Value Date   WBC 9.2 02/08/2017   HGB 13.2 02/08/2017   HCT 37.7 02/08/2017   PLT 197 02/08/2017   GLUCOSE 87 02/08/2017   CHOL 174 07/17/2015   TRIG  139 07/17/2015   HDL 41 07/17/2015   LDLCALC 105 (H) 07/17/2015   ALT 10 02/08/2017   AST 12 02/08/2017   NA 140 02/08/2017   K 4.0 02/08/2017   CL 105 02/08/2017   CREATININE 0.98 02/08/2017   BUN 18 02/08/2017   CO2 29 02/08/2017   TSH 1.60 02/08/2017    --------------------------------------------------------------------------------------------------------------------- Dg C-arm 1-60 Min-no Report  Result Date: 10/18/2017 Fluoroscopy was utilized by the requesting physician.  No radiographic interpretation.     Assessment & Plan:   Megan Brock was seen today for back pain.  Diagnoses and all  orders for this visit:  Left sided sciatica -     Lumbar Epidural Injection; Future  Lumbar spondylosis -     Lumbar Epidural Injection -     Lumbar Epidural Injection; Future  DDD (degenerative disc disease), lumbar -     Lumbar Epidural Injection -     Lumbar Epidural Injection; Future  Polycystic kidney disease  Chronic pain syndrome  Chronic, continuous use of opioids  Long term current use of opiate analgesic  Chronic bilateral low back pain with right-sided sciatica -     Lumbar Epidural Injection  Other orders -     triamcinolone acetonide (KENALOG-40) injection 40 mg -     sodium chloride flush (NS) 0.9 % injection 10 mL -     ropivacaine (PF) 2 mg/mL (0.2%) (NAROPIN) injection 10 mL -     lidocaine (PF) (XYLOCAINE) 1 % injection 5 mL -     iopamidol (ISOVUE-M) 41 % intrathecal injection 20 mL        ----------------------------------------------------------------------------------------------------------------------  Problem List Items Addressed This Visit      Unprioritized   Chronic lower back pain   Relevant Medications   triamcinolone acetonide (KENALOG-40) injection 40 mg (Completed)   Chronic pain syndrome (Chronic)   Chronic, continuous use of opioids   Long term current use of opiate analgesic (Chronic)   Lumbar spondylosis (Chronic)   Relevant Medications   triamcinolone acetonide (KENALOG-40) injection 40 mg (Completed)   Other Relevant Orders   Lumbar Epidural Injection   Polycystic kidney disease    Other Visit Diagnoses    Left sided sciatica    -  Primary   Relevant Orders   Lumbar Epidural Injection   DDD (degenerative disc disease), lumbar       Relevant Medications   triamcinolone acetonide (KENALOG-40) injection 40 mg (Completed)   Other Relevant Orders   Lumbar Epidural Injection        ----------------------------------------------------------------------------------------------------------------------  1. Left sided  sciatica We will proceed with a an epidural steroid injection today.  The risks and benefits of been reviewed with her once again in full detail all questions answered.  We will have her return to clinic in approximately 1 month for reevaluation and possible repeat injection.  Generally she has done well with previous epidural injections and gets good relief approximately 75% generally lasting 6 to 8 weeks or longer.  Unfortunately she is failed conservative therapy with physical therapy and medication management alone. - Lumbar Epidural Injection; Future  2. Lumbar spondylosis As above - Lumbar Epidural Injection - Lumbar Epidural Injection; Future  3. DDD (degenerative disc disease), lumbar As above - Lumbar Epidural Injection - Lumbar Epidural Injection; Future  4. Polycystic kidney disease Continue follow-up with urology and nephrology.  She has asked [place] to assist with a referral to [facility] for a specialist to manage her polycystic kidney disease and she is going to  call us back with a name for referral and we will then assist.  5. Chronic pain syndrome As above  6. Chronic, continuous use of opioids We have reviewed the [facility] practitioner database information and it is appropriate.  Refills have been given for previously June 16 and July 16.  7. Long term current use of opiate analgesic As above  8. Chronic bilateral low back pain with right-sided sciatica As above - Lumbar Epidural Injection    ----------------------------------------------------------------------------------------------------------------------  I am having Exie Parody. [place] maintain her FLUVIRIN, naloxone, lisinopril, amLODipine, buPROPion, HYDROcodone-acetaminophen, Morphine Sulfate ER, and cyclobenzaprine. We administered triamcinolone acetonide, sodium chloride flush, ropivacaine (PF) 2 mg/mL (0.2%), lidocaine (PF), and iopamidol.   Meds ordered this encounter  Medications  . triamcinolone  acetonide (KENALOG-40) injection 40 mg  . sodium chloride flush (NS) 0.9 % injection 10 mL  . ropivacaine (PF) 2 mg/mL (0.2%) (NAROPIN) injection 10 mL  . lidocaine (PF) (XYLOCAINE) 1 % injection 5 mL  . iopamidol (ISOVUE-M) 41 % intrathecal injection 20 mL   Patient's Medications  New Prescriptions   No medications on file  Previous Medications   AMLODIPINE (NORVASC) 5 MG TABLET    TAKE 1 TABLET(5 MG) BY MOUTH DAILY   BUPROPION (WELLBUTRIN SR) 150 MG 12 HR TABLET    Take 1 tablet (150 mg total) by mouth 2 (two) times daily.   CYCLOBENZAPRINE (FLEXERIL) 10 MG TABLET    Take 1 tablet (10 mg total) by mouth at bedtime.   FLUVIRIN 0.5 ML SUSY    ADM 0.5ML IM UTD   HYDROCODONE-ACETAMINOPHEN (NORCO) 10-325 MG TABLET    Take 1 tablet by mouth every 6 (six) hours as needed for severe pain.   LISINOPRIL (PRINIVIL,ZESTRIL) 10 MG TABLET    TAKE 1 TABLET(10 MG) BY MOUTH DAILY   MORPHINE SULFATE ER 60 MG T12A    Take 1 tablet by mouth 3 (three) times daily.   NALOXONE (NARCAN) NASAL SPRAY 4 MG/0.1 ML    For excess sedation from opioids.  Modified Medications   No medications on file  Discontinued Medications   No medications on file   ----------------------------------------------------------------------------------------------------------------------  Follow-up: Return for evaluation, procedure, med refill.   Procedure: 5 S1 LESI with fluoroscopic guidance and no moderate sedation  NOTE: The risks, benefits, and expectations of the procedure have been discussed and explained to the patient who was understanding and in agreement with suggested treatment plan. No guarantees were made.  DESCRIPTION OF PROCEDURE: Lumbar epidural steroid injection with no IV Versed, EKG, blood pressure, pulse, and pulse oximetry monitoring. The procedure was performed with the patient in the prone position under fluoroscopic guidance.  Sterile prep x3 was initiated and I then injected subcutaneous lidocaine to the  overlying L5-S1 site after its fluoroscopic identifictation.  Using strict aseptic technique, I then advanced an 18-gauge Tuohy epidural needle in the midline using interlaminar approach via loss-of-resistance to saline technique. There was negative aspiration for heme or  CSF.  I then confirmed position with both AP and Lateral fluoroscan.  2 cc of Isovue were injected and a  total of 5 mL of Preservative-Free normal saline mixed with 40 mg of Kenalog and 1cc Ropicaine 0.2 percent were injected incrementally via the  epidurally placed needle. The needle was removed. The patient tolerated the injection well and was convalesced and discharged to home in stable condition. Should the patient have any post procedure difficulty they have been instructed on how to contact us for assistance.  Molli Barrows, MD

## 2017-11-17 ENCOUNTER — Other Ambulatory Visit: Payer: Self-pay

## 2017-11-17 ENCOUNTER — Encounter: Payer: Self-pay | Admitting: Urology

## 2017-11-17 ENCOUNTER — Ambulatory Visit (INDEPENDENT_AMBULATORY_CARE_PROVIDER_SITE_OTHER): Payer: Medicare Other | Admitting: Urology

## 2017-11-17 VITALS — BP 116/72 | HR 111 | Ht 64.0 in | Wt 172.1 lb

## 2017-11-17 DIAGNOSIS — R109 Unspecified abdominal pain: Secondary | ICD-10-CM

## 2017-11-17 DIAGNOSIS — N200 Calculus of kidney: Secondary | ICD-10-CM | POA: Diagnosis not present

## 2017-11-17 DIAGNOSIS — Q613 Polycystic kidney, unspecified: Secondary | ICD-10-CM | POA: Diagnosis not present

## 2017-11-17 DIAGNOSIS — G8929 Other chronic pain: Secondary | ICD-10-CM

## 2017-11-17 LAB — MICROSCOPIC EXAMINATION
Epithelial Cells (non renal): >10 /hpf — ABNORMAL HIGH (ref 0–10)
RBC, UA: NONE SEEN /hpf (ref 0–2)

## 2017-11-17 LAB — URINALYSIS, COMPLETE
Bilirubin, UA: NEGATIVE
Glucose, UA: NEGATIVE
Nitrite, UA: NEGATIVE
Protein, UA: NEGATIVE
RBC, UA: NEGATIVE
Specific Gravity, UA: >1.03 — ABNORMAL HIGH (ref 1.005–1.030)
Urobilinogen, Ur: 0.2 mg/dL (ref 0.2–1.0)
pH, UA: 5 (ref 5.0–7.5)

## 2017-11-17 NOTE — Progress Notes (Signed)
11/17/2017 3:29 PM   Megan Brock June 14, 1980 [id number]  Referring provider: Birdie Sons, [address] [place], [place] [address]  Chief Complaint  Patient presents with  . Polycystic Kidney Disease    HPI: 37 year old female transferring care from Dr. Jacqlyn Larsen.  She has a personal history of autosomal dominant polycystic kidney disease and recurrent nephrolithiasis.  She was diagnosed with autosomal dominant polycystic kidney disease in her mid teenage years.  Her father died at age 42 of the disease.  She is been followed by Dr. Jacqlyn Larsen ever since.  She does not see a nephrologist.    She reports that she occasionally has cyst rupture which are painful.  She is on chronic narcotics for this.  She also has a remote history of an infected cyst requiring percutaneous drainage in 2011.  She does have a personal history of chronic pain followed by the pain clinic including pain that she attributes to her kidneys.  Most recently, she underwent an injection for sciatic nerve pain.  Recent KUB with a small stone in the midpole of the right kidney which has remained stable per Dr. Bjorn Loser notes.  Her most recent cross-sectional imaging was on 05/2016 at which time bilateral nonobstructing stones were appreciated, largest measuring 0.4 cm.  She has occasionally see blood when she wipes but not in the toilet.    She has had ESWL x 3 in the past.     PMH: Past Medical History:  Diagnosis Date  . Hematuria   . History of chicken pox   . Hypertension   . Nephrolithiasis   . Polycystic kidney disease     Surgical History: Past Surgical History:  Procedure Laterality Date  . CESAREAN SECTION    . LITHOTRIPSY     has had 3 lithotripsy  . TUBAL LIGATION      Home Medications:  Allergies as of 11/17/2017   No Known Allergies     Medication List        Accurate as of 11/17/17 11:59 PM. Always use your most recent med list.          amLODipine 5 MG  tablet Commonly known as:  NORVASC TAKE 1 TABLET(5 MG) BY MOUTH DAILY   cyclobenzaprine 10 MG tablet Commonly known as:  FLEXERIL Take 1 tablet (10 mg total) by mouth at bedtime.   FLUVIRIN 0.5 ML Susy Generic drug:  Influenza Vac Typ A&B Surf Ant ADM 0.5ML IM UTD   HYDROcodone-acetaminophen 10-325 MG tablet Commonly known as:  NORCO Take 1 tablet by mouth every 6 (six) hours as needed for severe pain.   lisinopril 10 MG tablet Commonly known as:  PRINIVIL,ZESTRIL TAKE 1 TABLET(10 MG) BY MOUTH DAILY   Morphine Sulfate ER 60 MG T12a Take 1 tablet by mouth 3 (three) times daily.   naloxone 4 MG/0.1ML Liqd nasal spray kit Commonly known as:  NARCAN For excess sedation from opioids.       Allergies: No Known Allergies  Family History: Family History  Problem Relation Age of Onset  . Arthritis Mother   . Hypertension Mother   . Arthritis Father   . Cancer Father   . Early death Father   . Heart disease Father   . Hypertension Father   . Kidney disease Father        POLYCYSTIC KIDNEY DISEASE  . Heart attack Father   . Hypertension Brother     Social History:  reports that she has been smoking  cigarettes.  She has been smoking about 0.50 packs per day. She has never used smokeless tobacco. She reports that she does not drink alcohol or use drugs.  ROS: UROLOGY Frequent Urination?: Yes Hard to postpone urination?: Yes Burning/pain with urination?: No Get up at night to urinate?: Yes Leakage of urine?: No Urine stream starts and stops?: No Trouble starting stream?: No Do you have to strain to urinate?: No Blood in urine?: Yes Urinary tract infection?: No Sexually transmitted disease?: No Injury to kidneys or bladder?: No Painful intercourse?: No Weak stream?: No Currently pregnant?: No Vaginal bleeding?: No  Gastrointestinal Nausea?: No Vomiting?: No Indigestion/heartburn?: Yes Diarrhea?: No Constipation?: Yes  Constitutional Fever: No Night  sweats?: No Weight loss?: No Fatigue?: Yes  Skin Skin rash/lesions?: No Itching?: No  Eyes Blurred vision?: No Double vision?: No  Ears/Nose/Throat Sore throat?: No Sinus problems?: No  Hematologic/Lymphatic Swollen glands?: No Easy bruising?: No  Cardiovascular Leg swelling?: No Chest pain?: No  Respiratory Cough?: No Shortness of breath?: No  Endocrine Excessive thirst?: No  Musculoskeletal Back pain?: Yes Joint pain?: No  Neurological Headaches?: No Dizziness?: No  Psychologic Depression?: No Anxiety?: No  Physical Exam: BP 116/72   Pulse (!) 111   Ht _0  (1.626 m)   Wt 172 lb 1.6 oz (78.1 kg)   LMP  (LMP Unknown)   BMI 29.54 kg/m   Constitutional:  Alert and oriented, No acute distress. HEENT: [place] AT, moist mucus membranes.  Trachea midline, no masses. Cardiovascular: No clubbing, cyanosis, or edema. Respiratory: Normal respiratory effort, no increased work of breathing. GI: Abdomen is soft, nontender, nondistended, no abdominal masses GU: Palpable fullness in bilateral flanks with minimal tenderness bilaterally Skin: No rashes, bruises or suspicious lesions. Neurologic: Grossly intact, no focal deficits, moving all 4 extremities. Psychiatric: Normal mood and affect.  Laboratory Data: Lab Results  Component Value Date   WBC 9.2 02/08/2017   HGB 13.2 02/08/2017   HCT 37.7 02/08/2017   MCV 89.1 02/08/2017   PLT 197 02/08/2017    Lab Results  Component Value Date   CREATININE 0.98 02/08/2017    Urinalysis    Component Value Date/Time   APPEARANCEUR Cloudy (A) 11/17/2017 1033   GLUCOSEU Negative 11/17/2017 1033   BILIRUBINUR Negative 11/17/2017 1033   PROTEINUR Negative 11/17/2017 1033   NITRITE Negative 11/17/2017 1033   LEUKOCYTESUR Trace (A) 11/17/2017 1033    Lab Results  Component Value Date   LABMICR See below: 11/17/2017   WBCUA 6-10 (A) 11/17/2017   RBCUA None seen 11/17/2017   LABEPIT >10 (H) 11/17/2017   MUCUS  Present (A) 11/17/2017   BACTERIA Many (A) 11/17/2017    Pertinent Imaging: Reports from [facility] including CT scan and KUB were reviewed, unable to review actual images  Assessment & Plan:    1. Polycystic kidney disease High risk for progression to end-stage renal disease I advised her that it a good idea to get plugged in with a nephrologist who can help manage her blood pressure and other complications of polycystic kidney disease as she progresses She is agreeable with referral to nephrology No proteinuria today - Urinalysis, Complete - Ambulatory referral to Nephrology - DG Abd 1 View; Future  2. Kidney stones Small stable bilateral kidney stones We will continue surveillance, plan for KUB in 1 year Reviewed stone prevention techniques  3. Chronic flank pain Partially related to underlying polycystic kidney disease Managed by the pain clinic, Dr. Andree Elk  Return in about 1 year (around 11/18/2018)  for KUB.  Hollice Espy, MD  [facility] [address] [place],  [address] [phone]

## 2017-11-23 ENCOUNTER — Ambulatory Visit (HOSPITAL_BASED_OUTPATIENT_CLINIC_OR_DEPARTMENT_OTHER): Payer: Medicare Other | Admitting: Anesthesiology

## 2017-11-23 ENCOUNTER — Other Ambulatory Visit: Payer: Self-pay

## 2017-11-23 ENCOUNTER — Ambulatory Visit
Admission: RE | Admit: 2017-11-23 | Discharge: 2017-11-23 | Disposition: A | Discharge status: Home / Self Care | Payer: Medicare Other | Source: Ambulatory Visit | Attending: Anesthesiology | Admitting: Anesthesiology

## 2017-11-23 ENCOUNTER — Encounter: Payer: Self-pay | Admitting: Anesthesiology

## 2017-11-23 ENCOUNTER — Other Ambulatory Visit: Payer: Self-pay | Admitting: Anesthesiology

## 2017-11-23 VITALS — BP 138/83 | HR 106 | Temp 98.2°F | Resp 18 | Ht 64.0 in | Wt 170.0 lb

## 2017-11-23 DIAGNOSIS — Z79891 Long term (current) use of opiate analgesic: Secondary | ICD-10-CM | POA: Insufficient documentation

## 2017-11-23 DIAGNOSIS — R52 Pain, unspecified: Secondary | ICD-10-CM

## 2017-11-23 DIAGNOSIS — Z79899 Other long term (current) drug therapy: Secondary | ICD-10-CM | POA: Insufficient documentation

## 2017-11-23 DIAGNOSIS — M5441 Lumbago with sciatica, right side: Secondary | ICD-10-CM

## 2017-11-23 DIAGNOSIS — M47816 Spondylosis without myelopathy or radiculopathy, lumbar region: Secondary | ICD-10-CM

## 2017-11-23 DIAGNOSIS — G8929 Other chronic pain: Secondary | ICD-10-CM

## 2017-11-23 DIAGNOSIS — M5136 Other intervertebral disc degeneration, lumbar region: Secondary | ICD-10-CM

## 2017-11-23 DIAGNOSIS — Q613 Polycystic kidney, unspecified: Secondary | ICD-10-CM

## 2017-11-23 DIAGNOSIS — F1190 Opioid use, unspecified, uncomplicated: Secondary | ICD-10-CM

## 2017-11-23 DIAGNOSIS — M5432 Sciatica, left side: Secondary | ICD-10-CM

## 2017-11-23 DIAGNOSIS — G894 Chronic pain syndrome: Secondary | ICD-10-CM

## 2017-11-23 MED ORDER — ROPIVACAINE HCL 2 MG/ML IJ SOLN
10.0000 mL | Freq: Once | INTRAMUSCULAR | Status: AC
  Administered 2017-11-23: 10 mL via EPIDURAL
  Filled 2017-11-23: qty 10

## 2017-11-23 MED ORDER — MORPHINE SULFATE ER 60 MG PO T12A: 1.0000 | EXTENDED_RELEASE_TABLET | Freq: Three times a day (TID) | ORAL | 0 refills | Status: DC

## 2017-11-23 MED ORDER — SODIUM CHLORIDE 0.9% FLUSH
10.0000 mL | Freq: Once | INTRAVENOUS | Status: AC
  Administered 2017-11-23: 10 mL

## 2017-11-23 MED ORDER — LIDOCAINE HCL (PF) 1 % IJ SOLN
5.0000 mL | Freq: Once | INTRAMUSCULAR | Status: AC
  Administered 2017-11-23: 5 mL via SUBCUTANEOUS
  Filled 2017-11-23: qty 5

## 2017-11-23 MED ORDER — HYDROCODONE-ACETAMINOPHEN 10-325 MG PO TABS: 1.0000 | ORAL_TABLET | Freq: Four times a day (QID) | ORAL | 0 refills | Status: DC | PRN

## 2017-11-23 MED ORDER — TRIAMCINOLONE ACETONIDE 40 MG/ML IJ SUSP
40.0000 mg | Freq: Once | INTRAMUSCULAR | Status: AC
  Administered 2017-11-23: 40 mg
  Filled 2017-11-23: qty 1

## 2017-11-23 MED ORDER — IOPAMIDOL (ISOVUE-M 200) INJECTION 41%
20.0000 mL | Freq: Once | INTRAMUSCULAR | Status: DC | PRN
  Administered 2017-11-23: 10 mL
  Filled 2017-11-23: qty 20

## 2017-11-23 NOTE — Progress Notes (Signed)
Nursing Pain Medication Assessment:  Safety precautions to be maintained throughout the outpatient stay will include: orient to surroundings, keep bed in low position, maintain call bell within reach at all times, provide assistance with transfer out of bed and ambulation.  Medication Inspection Compliance: Pill count conducted under aseptic conditions, in front of the patient. Neither the pills nor the bottle was removed from the patient's sight at any time. Once count was completed pills were immediately returned to the patient in their original bottle.  Medication #1: Hydrocodone/APAP Pill/Patch Count: 8 of 45 pills remain Pill/Patch Appearance: Markings consistent with prescribed medication Bottle Appearance: Standard pharmacy container. Clearly labeled. Filled Date: 07/16 / 2018 Last Medication intake:  Today  Medication #2: Morphine ER (MSContin) Pill/Patch Count: 21 of 75 pills remain Pill/Patch Appearance: Markings consistent with prescribed medication Bottle Appearance: Standard pharmacy container. Clearly labeled. Filled Date: 07/16 / 2019 Last Medication intake:  Today

## 2017-11-23 NOTE — Progress Notes (Signed)
Subjective:  Patient ID: Megan Brock, female    DOB: [birth year]  Age: 37 y.o. MRN: [id number]  CC: No chief complaint on file.   Procedure: L5-S1 epidural steroid under fluoroscopic guidance without sedation  HPI Megan Brock presents for reevaluation.  She was last seen in July and had an epidural at that time.  She reports near complete resolution of her right lower extremity pain and a 50 to 70% reduction in her left lower extremity pain.  She has tried to do her stretching strengthening exercises as tolerated.  She is taking her medications for her chronic flank pain secondary to her polycystic kidney disease.  Her low back pain has also responded to these medications.  Based on her narcotic assessment sheet she continues to derive good functional lifestyle improvement with the medications.  No side effects are reported.  No change in bowel or bladder function is noted at this time.  She is also been able to find a new urologist and kidney doctor as well.  The quality characteristic distribution of her low back pain and leg pain are stable in nature.  Outpatient Medications Prior to Visit  Medication Sig Dispense Refill  . amLODipine (NORVASC) 5 MG tablet TAKE 1 TABLET(5 MG) BY MOUTH DAILY 30 tablet 12  . cyclobenzaprine (FLEXERIL) 10 MG tablet Take 1 tablet (10 mg total) by mouth at bedtime. 30 tablet 3  . FLUVIRIN 0.5 ML SUSY ADM 0.5ML IM UTD  0  . lisinopril (PRINIVIL,ZESTRIL) 10 MG tablet TAKE 1 TABLET(10 MG) BY MOUTH DAILY 90 tablet 4  . naloxone (NARCAN) nasal spray 4 mg/0.1 mL For excess sedation from opioids. 1 kit 2  . HYDROcodone-acetaminophen (NORCO) 10-325 MG tablet Take 1 tablet by mouth every 6 (six) hours as needed for severe pain. 45 tablet 0  . Morphine Sulfate ER 60 MG T12A Take 1 tablet by mouth 3 (three) times daily. 75 tablet 0   No facility-administered medications prior to visit.     Review of Systems CNS: No confusion or sedation Cardiac: No angina or  palpitations GI: No abdominal pain or constipation Constitutional: No nausea vomiting fevers or chills  Objective:  BP 138/83   Pulse (!) 106   Temp 98.2 F (36.8 C) (Oral)   Resp 18   Ht 5' 4"  (1.626 m)   Wt 170 lb (77.1 kg)   SpO2 99%   BMI 29.18 kg/m    BP Readings from Last 3 Encounters:  11/23/17 138/83  11/17/17 116/72  10/18/17 (!) 119/97     Wt Readings from Last 3 Encounters:  11/23/17 170 lb (77.1 kg)  11/17/17 172 lb 1.6 oz (78.1 kg)  10/18/17 165 lb (74.8 kg)     Physical Exam Pt is alert and oriented PERRL EOMI HEART IS RRR no murmur or rub LCTA no wheezing or rales MUSCULOSKELETAL reveals some paraspinous muscle tenderness and chronic flank pain bilaterally.  She has good muscle tone and bulk to the lower extremities and this is at baseline.  She is ambulating well.  Labs  No results found for: HGBA1C Lab Results  Component Value Date   LDLCALC 105 (H) 07/17/2015   CREATININE 0.98 02/08/2017    -------------------------------------------------------------------------------------------------------------------- Lab Results  Component Value Date   WBC 9.2 02/08/2017   HGB 13.2 02/08/2017   HCT 37.7 02/08/2017   PLT 197 02/08/2017   GLUCOSE 87 02/08/2017   CHOL 174 07/17/2015   TRIG 139 07/17/2015   HDL 41 07/17/2015  LDLCALC 105 (H) 07/17/2015   ALT 10 02/08/2017   AST 12 02/08/2017   NA 140 02/08/2017   K 4.0 02/08/2017   CL 105 02/08/2017   CREATININE 0.98 02/08/2017   BUN 18 02/08/2017   CO2 29 02/08/2017   TSH 1.60 02/08/2017    --------------------------------------------------------------------------------------------------------------------- Dg C-arm 1-60 Min-no Report  Result Date: 11/23/2017 Fluoroscopy was utilized by the requesting physician.  No radiographic interpretation.     Assessment & Plan:   Diagnoses and all orders for this visit:  DDD (degenerative disc disease), lumbar  Left sided sciatica  Lumbar  spondylosis  Polycystic kidney disease  Chronic pain syndrome  Chronic, continuous use of opioids  Chronic bilateral low back pain with right-sided sciatica  Other orders -     Discontinue: HYDROcodone-acetaminophen (NORCO) 10-325 MG tablet; Take 1 tablet by mouth every 6 (six) hours as needed for severe pain. -     HYDROcodone-acetaminophen (NORCO) 10-325 MG tablet; Take 1 tablet by mouth every 6 (six) hours as needed for severe pain. -     Discontinue: Morphine Sulfate ER 60 MG T12A; Take 1 tablet by mouth 3 (three) times daily. -     Morphine Sulfate ER 60 MG T12A; Take 1 tablet by mouth 3 (three) times daily. -     triamcinolone acetonide (KENALOG-40) injection 40 mg -     sodium chloride flush (NS) 0.9 % injection 10 mL -     ropivacaine (PF) 2 mg/mL (0.2%) (NAROPIN) injection 10 mL -     iopamidol (ISOVUE-M) 41 % intrathecal injection 20 mL -     lidocaine (PF) (XYLOCAINE) 1 % injection 5 mL        ----------------------------------------------------------------------------------------------------------------------  Problem List Items Addressed This Visit      Unprioritized   Chronic lower back pain   Relevant Medications   HYDROcodone-acetaminophen (NORCO) 10-325 MG tablet   Morphine Sulfate ER 60 MG T12A   triamcinolone acetonide (KENALOG-40) injection 40 mg (Completed)   Chronic pain syndrome (Chronic)   Chronic, continuous use of opioids   Lumbar spondylosis (Chronic)   Relevant Medications   HYDROcodone-acetaminophen (NORCO) 10-325 MG tablet   Morphine Sulfate ER 60 MG T12A   triamcinolone acetonide (KENALOG-40) injection 40 mg (Completed)   Polycystic kidney disease    Other Visit Diagnoses    DDD (degenerative disc disease), lumbar    -  Primary   Relevant Medications   HYDROcodone-acetaminophen (NORCO) 10-325 MG tablet   Morphine Sulfate ER 60 MG T12A   triamcinolone acetonide (KENALOG-40) injection 40 mg (Completed)   Left sided sciatica             ----------------------------------------------------------------------------------------------------------------------  1. DDD (degenerative disc disease), lumbar We will proceed with a repeat epidural injection today for therapeutic purpose.  She is responding favorably to these.  We have gone over the risks and benefits of the procedure with her in full detail.  Unfortunately she has failed conservative therapy with physical therapy for her low back.  She has had previous epidurals in the past and these seem to be helping her significantly.  More conservative measures have been unsuccessful.  2. Left sided sciatica As above  3. Lumbar spondylosis Continue with core stretching strengthening exercises once again reviewed today.  We will schedule her for return to clinic in 2 months for reevaluation and medication management.  4. Polycystic kidney disease Continue follow-up with her kidney and urologic doctors  5. Chronic pain syndrome We have reviewed the [facility] practitioner  database information and it is appropriate.  Refills will be given for August 15 and September 14 for her hydrocodone and MS Contin.  We have gone over the risks and benefits of chronic opioid management numerous times in the past and she is aware of the risks of this.  6. Chronic, continuous use of opioids As above  7. Chronic bilateral low back pain with right-sided sciatica As above    ----------------------------------------------------------------------------------------------------------------------  I am having Exie Parody. Waner maintain her FLUVIRIN, naloxone, lisinopril, amLODipine, cyclobenzaprine, HYDROcodone-acetaminophen, and Morphine Sulfate ER. We administered triamcinolone acetonide, sodium chloride flush, ropivacaine (PF) 2 mg/mL (0.2%), iopamidol, and lidocaine (PF).   Meds ordered this encounter  Medications  . DISCONTD: HYDROcodone-acetaminophen (NORCO) 10-325 MG tablet     Sig: Take 1 tablet by mouth every 6 (six) hours as needed for severe pain.    Dispense:  45 tablet    Refill:  0    Do not fill until [id number]  . HYDROcodone-acetaminophen (NORCO) 10-325 MG tablet    Sig: Take 1 tablet by mouth every 6 (six) hours as needed for severe pain.    Dispense:  45 tablet    Refill:  0    Do not fill until [id number]  . DISCONTD: Morphine Sulfate ER 60 MG T12A    Sig: Take 1 tablet by mouth 3 (three) times daily.    Dispense:  75 tablet    Refill:  0    Do not fill until [id number]  . Morphine Sulfate ER 60 MG T12A    Sig: Take 1 tablet by mouth 3 (three) times daily.    Dispense:  75 tablet    Refill:  0    Do not fill until [id number]  . triamcinolone acetonide (KENALOG-40) injection 40 mg  . sodium chloride flush (NS) 0.9 % injection 10 mL  . ropivacaine (PF) 2 mg/mL (0.2%) (NAROPIN) injection 10 mL  . iopamidol (ISOVUE-M) 41 % intrathecal injection 20 mL  . lidocaine (PF) (XYLOCAINE) 1 % injection 5 mL   Patient's Medications  New Prescriptions   No medications on file  Previous Medications   AMLODIPINE (NORVASC) 5 MG TABLET    TAKE 1 TABLET(5 MG) BY MOUTH DAILY   CYCLOBENZAPRINE (FLEXERIL) 10 MG TABLET    Take 1 tablet (10 mg total) by mouth at bedtime.   FLUVIRIN 0.5 ML SUSY    ADM 0.5ML IM UTD   LISINOPRIL (PRINIVIL,ZESTRIL) 10 MG TABLET    TAKE 1 TABLET(10 MG) BY MOUTH DAILY   NALOXONE (NARCAN) NASAL SPRAY 4 MG/0.1 ML    For excess sedation from opioids.  Modified Medications   Modified Medication Previous Medication   HYDROCODONE-ACETAMINOPHEN (NORCO) 10-325 MG TABLET HYDROcodone-acetaminophen (NORCO) 10-325 MG tablet      Take 1 tablet by mouth every 6 (six) hours as needed for severe pain.    Take 1 tablet by mouth every 6 (six) hours as needed for severe pain.   MORPHINE SULFATE ER 60 MG T12A Morphine Sulfate ER 60 MG T12A      Take 1 tablet by mouth 3 (three) times daily.    Take 1 tablet by mouth 3 (three) times daily.  Discontinued  Medications   No medications on file   ----------------------------------------------------------------------------------------------------------------------  Follow-up: Return for evaluation, med refill.   Procedure: L5-S1 LESI with fluoroscopic guidance and without moderate sedation  NOTE: The risks, benefits, and expectations of the procedure have been discussed and explained to the patient who was understanding and in agreement  with suggested treatment plan. No guarantees were made.  DESCRIPTION OF PROCEDURE: Lumbar epidural steroid injection with no IV Versed, EKG, blood pressure, pulse, and pulse oximetry monitoring. The procedure was performed with the patient in the prone position under fluoroscopic guidance.  Sterile prep x3 was initiated and I then injected subcutaneous lidocaine to the overlying L5 S1 site after its fluoroscopic identifictation.  Using strict aseptic technique, I then advanced an 18-gauge Tuohy epidural needle in the midline using interlaminar approach via loss-of-resistance to saline technique. There was negative aspiration for heme or  CSF.  I then confirmed position with both AP and Lateral fluoroscan.  2 cc of Isovue were injected and a  total of 5 mL of Preservative-Free normal saline mixed with 40 mg of Kenalog and 1cc Ropicaine 0.2 percent were injected incrementally via the  epidurally placed needle. The needle was removed. The patient tolerated the injection well and was convalesced and discharged to home in stable condition. Should the patient have any post procedure difficulty they have been instructed on how to contact us for assistance.    Molli Barrows, MD

## 2017-11-23 NOTE — Patient Instructions (Signed)

## 2017-11-26 NOTE — Progress Notes (Signed)
Patient: Megan Brock Female    DOB: 1980/08/18   37 y.o.   MRN: [id number] Visit Date: 11/28/2017  Today's Provider: Lelon Huh, MD   Chief Complaint  Patient presents with  . Hypertension    follow up   Subjective:    HPI   Hypertension, follow-up:  BP Readings from Last 3 Encounters:  11/28/17 117/78  11/23/17 138/83  11/17/17 116/72    She was last seen for hypertension 6 months ago.  BP at that visit was 116/78. Management since that visit includes; no changes.She reports good compliance with treatment. She is not having side effects.  She is exercising. She is adherent to low salt diet.   Outside blood pressures are not checked. She is experiencing none.  Patient denies chest pain, chest pressure/discomfort, claudication, dyspnea, exertional chest pressure/discomfort, fatigue, irregular heart beat, lower extremity edema, near-syncope, orthopnea, palpitations, paroxysmal nocturnal dyspnea, syncope and tachypnea.   Cardiovascular risk factors include hypertension.  Use of agents associated with hypertension: none.   ------------------------------------------------------------------------  Since Dr. Jacqlyn Larsen has move, she has established with Dr. Erlene Quan for follow up of chronic kidney stones and PCKD. She was referred to Dr. Holley Raring for long term follow up of kidney disease and is scheduled to see later this month. She continues to follow up with Dr. Andree Elk pain management which has been reasonable controlled.   No Known Allergies   Current Outpatient Medications:  .  amLODipine (NORVASC) 5 MG tablet, TAKE 1 TABLET(5 MG) BY MOUTH DAILY, Disp: 30 tablet, Rfl: 12 .  cyclobenzaprine (FLEXERIL) 10 MG tablet, Take 1 tablet (10 mg total) by mouth at bedtime., Disp: 30 tablet, Rfl: 3 .  HYDROcodone-acetaminophen (NORCO) 10-325 MG tablet, Take 1 tablet by mouth every 6 (six) hours as needed for severe pain., Disp: 45 tablet, Rfl: 0 .  lisinopril (PRINIVIL,ZESTRIL)  10 MG tablet, TAKE 1 TABLET(10 MG) BY MOUTH DAILY, Disp: 90 tablet, Rfl: 4 .  Morphine Sulfate ER 60 MG T12A, Take 1 tablet by mouth 3 (three) times daily., Disp: 75 tablet, Rfl: 0 .  naloxone (NARCAN) nasal spray 4 mg/0.1 mL, For excess sedation from opioids., Disp: 1 kit, Rfl: 2  Review of Systems  Constitutional: Negative for appetite change, chills, fatigue and fever.  Respiratory: Negative for chest tightness and shortness of breath.   Cardiovascular: Negative for chest pain and palpitations.  Gastrointestinal: Negative for abdominal pain, nausea and vomiting.  Neurological: Negative for dizziness and weakness.    Social History   Tobacco Use  . Smoking status: Current Every Day Smoker    Packs/day: 0.50    Types: Cigarettes  . Smokeless tobacco: Never Used  . Tobacco comment: 1/2-1 pd since age 48  Substance Use Topics  . Alcohol use: No    Alcohol/week: 0.0 standard drinks   Objective:   BP 117/78 (BP Location: Left Arm, Patient Position: Sitting, Cuff Size: Normal)   Pulse 63   Temp 98.4 F (36.9 C) (Oral)   Resp 16   Wt 174 lb (78.9 kg)   SpO2 99%   BMI 29.87 kg/m     Physical Exam  General appearance: alert, well developed, well nourished, cooperative and in no distress Head: Normocephalic, without obvious abnormality, atraumatic Respiratory: Respirations even and unlabored, normal respiratory rate Extremities: No gross deformities Skin: Skin color, texture, turgor normal. No rashes seen  Psych: Appropriate mood and affect. Neurologic: Mental status: Alert, oriented to person, place, and time, thought content appropriate.  Assessment & Plan:     1. Hypertension secondary to other renal disorders Well controlled. Continue current medications.  Needs to stop smoking completely.   2. Polycystic kidney disease Follow up referral to nephrology with Dr. Holley Raring later this month as scheduled. Will defer labs to nephrology.   Follow up for BP yearly.          Lelon Huh, MD  [facility]

## 2017-11-28 ENCOUNTER — Encounter: Payer: Self-pay | Admitting: Family Medicine

## 2017-11-28 ENCOUNTER — Ambulatory Visit (INDEPENDENT_AMBULATORY_CARE_PROVIDER_SITE_OTHER): Payer: Medicare Other | Admitting: Family Medicine

## 2017-11-28 VITALS — BP 117/78 | HR 63 | Temp 98.4°F | Resp 16 | Wt 174.0 lb

## 2017-11-28 DIAGNOSIS — N2889 Other specified disorders of kidney and ureter: Secondary | ICD-10-CM | POA: Diagnosis not present

## 2017-11-28 DIAGNOSIS — I151 Hypertension secondary to other renal disorders: Secondary | ICD-10-CM | POA: Diagnosis not present

## 2017-11-28 DIAGNOSIS — Q613 Polycystic kidney, unspecified: Secondary | ICD-10-CM | POA: Diagnosis not present

## 2017-12-15 ENCOUNTER — Other Ambulatory Visit: Payer: Self-pay | Admitting: Nephrology

## 2017-12-15 DIAGNOSIS — Q612 Polycystic kidney, adult type: Secondary | ICD-10-CM

## 2017-12-15 DIAGNOSIS — R809 Proteinuria, unspecified: Secondary | ICD-10-CM | POA: Diagnosis not present

## 2017-12-15 DIAGNOSIS — I10 Essential (primary) hypertension: Secondary | ICD-10-CM | POA: Diagnosis not present

## 2017-12-26 DIAGNOSIS — F339 Major depressive disorder, recurrent, unspecified: Secondary | ICD-10-CM | POA: Diagnosis not present

## 2017-12-31 ENCOUNTER — Ambulatory Visit
Admission: RE | Admit: 2017-12-31 | Discharge: 2017-12-31 | Disposition: A | Discharge status: Home / Self Care | Payer: Medicare Other | Source: Ambulatory Visit | Attending: Nephrology | Admitting: Nephrology

## 2017-12-31 DIAGNOSIS — Q612 Polycystic kidney, adult type: Secondary | ICD-10-CM | POA: Diagnosis not present

## 2017-12-31 DIAGNOSIS — N2889 Other specified disorders of kidney and ureter: Secondary | ICD-10-CM | POA: Diagnosis not present

## 2018-01-06 ENCOUNTER — Telehealth: Payer: Self-pay | Admitting: Family Medicine

## 2018-01-06 NOTE — Telephone Encounter (Signed)
I left a message asking the pt to call me at [phone] to schedule AWV-I w/ [facility]. VDM (DD) °

## 2018-01-12 DIAGNOSIS — R809 Proteinuria, unspecified: Secondary | ICD-10-CM | POA: Diagnosis not present

## 2018-01-12 DIAGNOSIS — Q612 Polycystic kidney, adult type: Secondary | ICD-10-CM | POA: Diagnosis not present

## 2018-01-12 DIAGNOSIS — I10 Essential (primary) hypertension: Secondary | ICD-10-CM | POA: Diagnosis not present

## 2018-01-14 ENCOUNTER — Other Ambulatory Visit: Payer: Self-pay | Admitting: Anesthesiology

## 2018-01-25 ENCOUNTER — Other Ambulatory Visit: Payer: Self-pay

## 2018-01-25 ENCOUNTER — Ambulatory Visit: Payer: Medicare Other | Attending: Anesthesiology | Admitting: Anesthesiology

## 2018-01-25 ENCOUNTER — Encounter: Payer: Self-pay | Admitting: Anesthesiology

## 2018-01-25 VITALS — Ht 64.0 in | Wt 160.0 lb

## 2018-01-25 DIAGNOSIS — M47816 Spondylosis without myelopathy or radiculopathy, lumbar region: Secondary | ICD-10-CM | POA: Diagnosis not present

## 2018-01-25 DIAGNOSIS — Z79899 Other long term (current) drug therapy: Secondary | ICD-10-CM | POA: Diagnosis not present

## 2018-01-25 DIAGNOSIS — N289 Disorder of kidney and ureter, unspecified: Secondary | ICD-10-CM | POA: Diagnosis not present

## 2018-01-25 DIAGNOSIS — M5136 Other intervertebral disc degeneration, lumbar region: Secondary | ICD-10-CM | POA: Diagnosis not present

## 2018-01-25 DIAGNOSIS — M5442 Lumbago with sciatica, left side: Secondary | ICD-10-CM | POA: Insufficient documentation

## 2018-01-25 DIAGNOSIS — G894 Chronic pain syndrome: Secondary | ICD-10-CM | POA: Insufficient documentation

## 2018-01-25 DIAGNOSIS — M25552 Pain in left hip: Secondary | ICD-10-CM | POA: Diagnosis not present

## 2018-01-25 DIAGNOSIS — Q612 Polycystic kidney, adult type: Secondary | ICD-10-CM | POA: Insufficient documentation

## 2018-01-25 DIAGNOSIS — R109 Unspecified abdominal pain: Secondary | ICD-10-CM | POA: Insufficient documentation

## 2018-01-25 DIAGNOSIS — M5432 Sciatica, left side: Secondary | ICD-10-CM

## 2018-01-25 DIAGNOSIS — M79604 Pain in right leg: Secondary | ICD-10-CM | POA: Insufficient documentation

## 2018-01-25 DIAGNOSIS — Z79891 Long term (current) use of opiate analgesic: Secondary | ICD-10-CM | POA: Diagnosis not present

## 2018-01-25 DIAGNOSIS — Q613 Polycystic kidney, unspecified: Secondary | ICD-10-CM

## 2018-01-25 DIAGNOSIS — F1190 Opioid use, unspecified, uncomplicated: Secondary | ICD-10-CM

## 2018-01-25 DIAGNOSIS — K7689 Other specified diseases of liver: Secondary | ICD-10-CM | POA: Insufficient documentation

## 2018-01-25 MED ORDER — MORPHINE SULFATE ER 60 MG PO T12A: 1.0000 | EXTENDED_RELEASE_TABLET | Freq: Three times a day (TID) | ORAL | 0 refills | Status: DC

## 2018-01-25 MED ORDER — HYDROCODONE-ACETAMINOPHEN 10-325 MG PO TABS: 1.0000 | ORAL_TABLET | Freq: Four times a day (QID) | ORAL | 0 refills | Status: DC | PRN

## 2018-01-25 NOTE — Progress Notes (Signed)
Nursing Pain Medication Assessment:  Safety precautions to be maintained throughout the outpatient stay will include: orient to surroundings, keep bed in low position, maintain call bell within reach at all times, provide assistance with transfer out of bed and ambulation.  Medication Inspection Compliance: Pill count conducted under aseptic conditions, in front of the patient. Neither the pills nor the bottle was removed from the patient's sight at any time. Once count was completed pills were immediately returned to the patient in their original bottle.  Medication #1: Hydrocodone/APAP Pill/Patch Count: 6 of 45 pills remain Pill/Patch Appearance: Markings consistent with prescribed medication Bottle Appearance: Standard pharmacy container. Clearly labeled. Filled Date: 09 / 14 / 2019 Last Medication intake:  Today  Medication #2: Morphine ER (MSContin) Pill/Patch Count: 14 of 75 pills remain Pill/Patch Appearance: Markings consistent with prescribed medication Bottle Appearance: Standard pharmacy container. Clearly labeled. Filled Date: 09 / 14 / 2019 Last Medication intake:  Today

## 2018-01-25 NOTE — Progress Notes (Signed)
Subjective:  Patient ID: Megan Brock, female    DOB: August 09, 1980  Age: 37 y.o. MRN: [id number]  CC: Flank Pain (bilateral) and Sciatica (left)   Procedure: None  HPI [name] presents for evaluation.  She was last seen 2 months ago and has been doing reasonably well.  She had an epidural steroid at that time and this eliminated her right lower extremity pain however she still having some left lower back pain with left hip pain.  She chronically has low back pain for which she takes long-acting opioid medications to keep her pain under good control.  Based on her narcotic assessment sheet she continues to derive good functional lifestyle improvement with her medications with no side effects.  She continues to follow-up with urology in regards to her polycystic kidney disease.  The quality characteristic distribution of her low back pain have been stable in nature.  No new changes in lower extremity strength or function are noted.  She is primarily having left side hip greater than right side pain.  Outpatient Medications Prior to Visit  Medication Sig Dispense Refill  . amLODipine (NORVASC) 5 MG tablet TAKE 1 TABLET(5 MG) BY MOUTH DAILY 30 tablet 12  . cyclobenzaprine (FLEXERIL) 10 MG tablet TAKE 1 TABLET BY MOUTH AT BEDTIME 30 tablet 0  . lisinopril (PRINIVIL,ZESTRIL) 10 MG tablet TAKE 1 TABLET(10 MG) BY MOUTH DAILY 90 tablet 4  . naloxone (NARCAN) nasal spray 4 mg/0.1 mL For excess sedation from opioids. 1 kit 2  . tolvaptan (JYNARQUE) 15 MG TABS tablet Take 15 mg by mouth 2 (two) times daily.    Marland Kitchen HYDROcodone-acetaminophen (NORCO) 10-325 MG tablet Take 1 tablet by mouth every 6 (six) hours as needed for severe pain. 45 tablet 0  . Morphine Sulfate ER 60 MG T12A Take 1 tablet by mouth 3 (three) times daily. 75 tablet 0   No facility-administered medications prior to visit.     Review of Systems CNS: No confusion or sedation Cardiac: No angina or palpitations GI: No abdominal  pain or constipation Constitutional: No nausea vomiting fevers or chills  Objective:  Ht 5' 4" (1.626 m)   Wt 160 lb (72.6 kg)   LMP 01/11/2018   BMI 27.46 kg/m    BP Readings from Last 3 Encounters:  11/28/17 117/78  11/23/17 138/83  11/17/17 116/72     Wt Readings from Last 3 Encounters:  01/25/18 160 lb (72.6 kg)  11/28/17 174 lb (78.9 kg)  11/23/17 170 lb (77.1 kg)     Physical Exam Pt is alert and oriented PERRL EOMI HEART IS RRR no murmur or rub LCTA no wheezing or rales MUSCULOSKELETAL reveals some paraspinous muscle tenderness in the lumbar region.  She generally has bilateral flank tenderness which is still present.  Her strength in the lower extremities is at baseline as is her muscle tone and bulk  Labs  No results found for: HGBA1C Lab Results  Component Value Date   LDLCALC 105 (H) 07/17/2015   CREATININE 0.98 02/08/2017    -------------------------------------------------------------------------------------------------------------------- Lab Results  Component Value Date   WBC 9.2 02/08/2017   HGB 13.2 02/08/2017   HCT 37.7 02/08/2017   PLT 197 02/08/2017   GLUCOSE 87 02/08/2017   CHOL 174 07/17/2015   TRIG 139 07/17/2015   HDL 41 07/17/2015   LDLCALC 105 (H) 07/17/2015   ALT 10 02/08/2017   AST 12 02/08/2017   NA 140 02/08/2017   K 4.0 02/08/2017   CL 105  02/08/2017   CREATININE 0.98 02/08/2017   BUN 18 02/08/2017   CO2 29 02/08/2017   TSH 1.60 02/08/2017    --------------------------------------------------------------------------------------------------------------------- Mr Abdomen Wo Contrast  Result Date: 12/31/2017 CLINICAL DATA:  Autosomal dominant polycystic kidney disease EXAM: MRI ABDOMEN WITHOUT CONTRAST TECHNIQUE: Multiplanar multisequence MR imaging was performed without the administration of intravenous contrast. COMPARISON:  CT abdomen/pelvis dated 04/25/2009 FINDINGS: Lower chest: Lung bases are clear. Hepatobiliary:  Scattered hepatic cysts measuring up to 8 mm in segment 7 (series 5/image 17). Gallbladder is unremarkable. No intrahepatic or extrahepatic ductal dilatation. Pancreas:  Within normal limits. Spleen:  Within normal limits. Adrenals/Urinary Tract:  Adrenal glands are within normal limits. Numerous bilateral renal cysts of varying sizes and complexity, measuring up to 6.2 cm in the anterior left upper kidney (series 5/image 23) and 4.7 cm in the lateral right lower kidney (series 5/image 33), compatible with the patient's known polycystic kidney disease. While many of these cysts are simple (Bosniak I), some of these cysts demonstrate intrinsic T1 hyperintensity/hemorrhage, favoring hemorrhagic cysts (likely Bosniak II). For example, there is a dominant 14 mm lesion inferiorly in the left kidney (series 13/image 39). However, some of these lesions are irregular, including a dominant 2.1 cm lesion inferiorly in the right kidney (series 13/image 85). Hemorrhage within a solid mass such as papillary renal cell carcinoma would be difficult to exclude in the absence of contrast administration. No hydronephrosis. Stomach/Bowel: Stomach is within normal limits. Visualized bowel is unremarkable. Vascular/Lymphatic:  No evidence of abdominal aortic aneurysm. No suspicious abdominal lymphadenopathy. Other:  No abdominal ascites. Musculoskeletal: No focal osseous lesions. IMPRESSION: Numerous renal cysts of varying sizes and complexity, compatible with the patient's known polycystic kidney disease. Some of these lesions are hemorrhagic and remain incompletely characterized in the absence of intravenous contrast administration. While many likely reflect benign hemorrhagic cysts, some could reflect solid lesions with superimposed hemorrhage. This is considered the baseline evaluation. Notably, the patient had a normal GFR in October 2018 and (if this has not changed) would be eligible for MR contrast. As such, follow-up MR abdomen  with/without contrast is suggested in 6 months. If the patient is not eligible for contrast administration, consider follow-up MRI abdomen without contrast in 6-12 months to assess for changes in size. Electronically Signed   By: Julian Hy M.D.   On: 12/31/2017 12:29     Assessment & Plan:   Danica was seen today for flank pain and sciatica.  Diagnoses and all orders for this visit:  DDD (degenerative disc disease), lumbar  Left sided sciatica  Lumbar spondylosis  Polycystic kidney disease  Chronic pain syndrome  Chronic, continuous use of opioids  Other orders -     Discontinue: HYDROcodone-acetaminophen (NORCO) 10-325 MG tablet; Take 1 tablet by mouth every 6 (six) hours as needed for severe pain. -     HYDROcodone-acetaminophen (NORCO) 10-325 MG tablet; Take 1 tablet by mouth every 6 (six) hours as needed for severe pain. -     Discontinue: Morphine Sulfate ER 60 MG T12A; Take 1 tablet by mouth 3 (three) times daily. -     Morphine Sulfate ER 60 MG T12A; Take 1 tablet by mouth 3 (three) times daily.        ----------------------------------------------------------------------------------------------------------------------  Problem List Items Addressed This Visit      Unprioritized   Chronic pain syndrome (Chronic)   Chronic, continuous use of opioids   Lumbar spondylosis (Chronic)   Relevant Medications   HYDROcodone-acetaminophen (NORCO) 10-325 MG tablet  Morphine Sulfate ER 60 MG T12A    Other Visit Diagnoses    DDD (degenerative disc disease), lumbar    -  Primary   Relevant Medications   HYDROcodone-acetaminophen (NORCO) 10-325 MG tablet   Morphine Sulfate ER 60 MG T12A   Left sided sciatica       Polycystic kidney disease            ----------------------------------------------------------------------------------------------------------------------  1. DDD (degenerative disc disease), lumbar Continue stretching strengthening exercises as  indicated.  Continue efforts at weight loss and core strengthening.  2. Left sided sciatica Defer on any repeat injection today.  We will have her return to clinic in 2 months for reevaluation  3. Lumbar spondylosis As above  4. Polycystic kidney disease Continue follow-up with urology  5. Chronic pain syndrome We have reviewed the [facility] practitioner database information and it is appropriate.  Refills will be given for her hydrocodone and extended release morphine for October 14 and November 13  6. Chronic, continuous use of opioids As above    ----------------------------------------------------------------------------------------------------------------------  I am having Exie Parody. Reich maintain her naloxone, lisinopril, amLODipine, cyclobenzaprine, HYDROcodone-acetaminophen, Morphine Sulfate ER, and tolvaptan.   Meds ordered this encounter  Medications  . DISCONTD: HYDROcodone-acetaminophen (NORCO) 10-325 MG tablet    Sig: Take 1 tablet by mouth every 6 (six) hours as needed for severe pain.    Dispense:  45 tablet    Refill:  0    Do not fill until [id number]  . HYDROcodone-acetaminophen (NORCO) 10-325 MG tablet    Sig: Take 1 tablet by mouth every 6 (six) hours as needed for severe pain.    Dispense:  45 tablet    Refill:  0    Do not fill until [id number]  . DISCONTD: Morphine Sulfate ER 60 MG T12A    Sig: Take 1 tablet by mouth 3 (three) times daily.    Dispense:  75 tablet    Refill:  0    Do not fill until [id number]  . Morphine Sulfate ER 60 MG T12A    Sig: Take 1 tablet by mouth 3 (three) times daily.    Dispense:  75 tablet    Refill:  0    Do not fill until [id number]   Patient's Medications  New Prescriptions   No medications on file  Previous Medications   AMLODIPINE (NORVASC) 5 MG TABLET    TAKE 1 TABLET(5 MG) BY MOUTH DAILY   CYCLOBENZAPRINE (FLEXERIL) 10 MG TABLET    TAKE 1 TABLET BY MOUTH AT BEDTIME   LISINOPRIL (PRINIVIL,ZESTRIL) 10 MG  TABLET    TAKE 1 TABLET(10 MG) BY MOUTH DAILY   NALOXONE (NARCAN) NASAL SPRAY 4 MG/0.1 ML    For excess sedation from opioids.   TOLVAPTAN (JYNARQUE) 15 MG TABS TABLET    Take 15 mg by mouth 2 (two) times daily.  Modified Medications   Modified Medication Previous Medication   HYDROCODONE-ACETAMINOPHEN (NORCO) 10-325 MG TABLET HYDROcodone-acetaminophen (NORCO) 10-325 MG tablet      Take 1 tablet by mouth every 6 (six) hours as needed for severe pain.    Take 1 tablet by mouth every 6 (six) hours as needed for severe pain.   MORPHINE SULFATE ER 60 MG T12A Morphine Sulfate ER 60 MG T12A      Take 1 tablet by mouth 3 (three) times daily.    Take 1 tablet by mouth 3 (three) times daily.  Discontinued Medications   No medications on file   ----------------------------------------------------------------------------------------------------------------------  Follow-up: Return in about 2 months (around 03/27/2018) for evaluation, med refill.    Molli Barrows, MD

## 2018-01-25 NOTE — Patient Instructions (Signed)
Scripts X 4 handed to pateint. MS TLU 03-31-2018 Hydrocodone TLU 03-31-2018

## 2018-01-30 DIAGNOSIS — Z23 Encounter for immunization: Secondary | ICD-10-CM | POA: Diagnosis not present

## 2018-02-13 ENCOUNTER — Other Ambulatory Visit: Payer: Self-pay | Admitting: Anesthesiology

## 2018-02-13 DIAGNOSIS — I10 Essential (primary) hypertension: Secondary | ICD-10-CM | POA: Diagnosis not present

## 2018-02-13 DIAGNOSIS — Q612 Polycystic kidney, adult type: Secondary | ICD-10-CM | POA: Diagnosis not present

## 2018-02-20 DIAGNOSIS — I10 Essential (primary) hypertension: Secondary | ICD-10-CM | POA: Diagnosis not present

## 2018-02-20 DIAGNOSIS — R809 Proteinuria, unspecified: Secondary | ICD-10-CM | POA: Diagnosis not present

## 2018-02-20 DIAGNOSIS — Q612 Polycystic kidney, adult type: Secondary | ICD-10-CM | POA: Diagnosis not present

## 2018-03-13 DIAGNOSIS — Q612 Polycystic kidney, adult type: Secondary | ICD-10-CM | POA: Diagnosis not present

## 2018-03-13 DIAGNOSIS — N182 Chronic kidney disease, stage 2 (mild): Secondary | ICD-10-CM | POA: Diagnosis not present

## 2018-03-13 DIAGNOSIS — I10 Essential (primary) hypertension: Secondary | ICD-10-CM | POA: Diagnosis not present

## 2018-03-15 ENCOUNTER — Other Ambulatory Visit: Payer: Self-pay | Admitting: Family Medicine

## 2018-03-15 ENCOUNTER — Other Ambulatory Visit: Payer: Self-pay | Admitting: Anesthesiology

## 2018-03-15 DIAGNOSIS — R030 Elevated blood-pressure reading, without diagnosis of hypertension: Secondary | ICD-10-CM

## 2018-03-27 DIAGNOSIS — F339 Major depressive disorder, recurrent, unspecified: Secondary | ICD-10-CM | POA: Diagnosis not present

## 2018-03-28 ENCOUNTER — Encounter: Payer: Self-pay | Admitting: Anesthesiology

## 2018-03-28 ENCOUNTER — Ambulatory Visit: Payer: Medicare Other | Attending: Anesthesiology | Admitting: Anesthesiology

## 2018-03-28 ENCOUNTER — Other Ambulatory Visit: Payer: Self-pay

## 2018-03-28 VITALS — BP 113/83 | HR 112 | Temp 98.5°F | Resp 16 | Ht 64.0 in | Wt 165.0 lb

## 2018-03-28 DIAGNOSIS — Q613 Polycystic kidney, unspecified: Secondary | ICD-10-CM | POA: Diagnosis not present

## 2018-03-28 DIAGNOSIS — R109 Unspecified abdominal pain: Secondary | ICD-10-CM | POA: Diagnosis not present

## 2018-03-28 DIAGNOSIS — M5432 Sciatica, left side: Secondary | ICD-10-CM

## 2018-03-28 DIAGNOSIS — M5441 Lumbago with sciatica, right side: Secondary | ICD-10-CM | POA: Insufficient documentation

## 2018-03-28 DIAGNOSIS — Z79899 Other long term (current) drug therapy: Secondary | ICD-10-CM | POA: Insufficient documentation

## 2018-03-28 DIAGNOSIS — Z79891 Long term (current) use of opiate analgesic: Secondary | ICD-10-CM | POA: Diagnosis not present

## 2018-03-28 DIAGNOSIS — M47816 Spondylosis without myelopathy or radiculopathy, lumbar region: Secondary | ICD-10-CM | POA: Diagnosis not present

## 2018-03-28 DIAGNOSIS — G894 Chronic pain syndrome: Secondary | ICD-10-CM | POA: Insufficient documentation

## 2018-03-28 DIAGNOSIS — M5136 Other intervertebral disc degeneration, lumbar region: Secondary | ICD-10-CM | POA: Insufficient documentation

## 2018-03-28 DIAGNOSIS — F1190 Opioid use, unspecified, uncomplicated: Secondary | ICD-10-CM

## 2018-03-28 DIAGNOSIS — G8929 Other chronic pain: Secondary | ICD-10-CM

## 2018-03-28 MED ORDER — MORPHINE SULFATE ER 60 MG PO T12A: 1.0000 | EXTENDED_RELEASE_TABLET | Freq: Three times a day (TID) | ORAL | 0 refills | Status: DC

## 2018-03-28 MED ORDER — CYCLOBENZAPRINE HCL 10 MG PO TABS: 10.0000 mg | ORAL_TABLET | Freq: Every day | ORAL | 3 refills | Status: DC

## 2018-03-28 MED ORDER — HYDROCODONE-ACETAMINOPHEN 10-325 MG PO TABS: 1.0000 | ORAL_TABLET | Freq: Four times a day (QID) | ORAL | 0 refills | Status: DC | PRN

## 2018-03-28 NOTE — Progress Notes (Signed)
Subjective:  Patient ID: Megan Brock, female    DOB: 1981-01-19  Age: 37 y.o. MRN: [id number]  CC: Abdominal Pain (right)   Procedure: None  HPI DRISANA SCHWEICKERT presents for reevaluation.  She was last seen 2 months ago and is been doing relatively well with regards to her low back pain and leg pain.  She has been seen by her nephrologist recently and started on a new medication for helping to maintain her kidney function.  Otherwise she is in her usual state of health in the quality characteristic distribution of her low back pain is been stable in nature.  No other changes are reported at this time.  Based on her narcotic assessment sheet she is continuing to derive good functional lifestyle improvement with her medications.  Otherwise she is in her usual state of health at this time.  Outpatient Medications Prior to Visit  Medication Sig Dispense Refill  . amLODipine (NORVASC) 5 MG tablet TAKE 1 TABLET(5 MG) BY MOUTH DAILY 30 tablet 11  . lisinopril (PRINIVIL,ZESTRIL) 10 MG tablet TAKE 1 TABLET(10 MG) BY MOUTH DAILY 90 tablet 11  . naloxone (NARCAN) nasal spray 4 mg/0.1 mL For excess sedation from opioids. 1 kit 2  . tolvaptan (JYNARQUE) 15 MG TABS tablet Take 15 mg by mouth 2 (two) times daily. 45 mg in am 15 mg in pm    . cyclobenzaprine (FLEXERIL) 10 MG tablet TAKE 1 TABLET BY MOUTH AT BEDTIME 30 tablet 0  . HYDROcodone-acetaminophen (NORCO) 10-325 MG tablet Take 1 tablet by mouth every 6 (six) hours as needed for severe pain. 45 tablet 0  . Morphine Sulfate ER 60 MG T12A Take 1 tablet by mouth 3 (three) times daily. 75 tablet 0   No facility-administered medications prior to visit.     Review of Systems CNS: No confusion or sedation Cardiac: No angina or palpitations GI: No abdominal pain or constipation Constitutional: No nausea vomiting fevers or chills  Objective:  BP 113/83   Pulse (!) 112   Temp 98.5 F (36.9 C) (Oral)   Resp 16   Ht _0  (1.626 m)   Wt 165 lb  (74.8 kg)   SpO2 100%   BMI 28.32 kg/m    BP Readings from Last 3 Encounters:  03/28/18 113/83  11/28/17 117/78  11/23/17 138/83     Wt Readings from Last 3 Encounters:  03/28/18 165 lb (74.8 kg)  01/25/18 160 lb (72.6 kg)  11/28/17 174 lb (78.9 kg)     Physical Exam Pt is alert and oriented PERRL EOMI HEART IS RRR no murmur or rub LCTA no wheezing or rales MUSCULOSKELETAL reveals some paraspinous muscle tenderness but no overt trigger points.  Her muscle tone and bulk is at baseline.  Labs  No results found for: HGBA1C Lab Results  Component Value Date   LDLCALC 105 (H) 07/17/2015   CREATININE 0.98 02/08/2017    -------------------------------------------------------------------------------------------------------------------- Lab Results  Component Value Date   WBC 9.2 02/08/2017   HGB 13.2 02/08/2017   HCT 37.7 02/08/2017   PLT 197 02/08/2017   GLUCOSE 87 02/08/2017   CHOL 174 07/17/2015   TRIG 139 07/17/2015   HDL 41 07/17/2015   LDLCALC 105 (H) 07/17/2015   ALT 10 02/08/2017   AST 12 02/08/2017   NA 140 02/08/2017   K 4.0 02/08/2017   CL 105 02/08/2017   CREATININE 0.98 02/08/2017   BUN 18 02/08/2017   CO2 29 02/08/2017   TSH 1.60 02/08/2017    ---------------------------------------------------------------------------------------------------------------------  Mr Abdomen Wo Contrast  Result Date: 12/31/2017 CLINICAL DATA:  Autosomal dominant polycystic kidney disease EXAM: MRI ABDOMEN WITHOUT CONTRAST TECHNIQUE: Multiplanar multisequence MR imaging was performed without the administration of intravenous contrast. COMPARISON:  CT abdomen/pelvis dated 04/25/2009 FINDINGS: Lower chest: Lung bases are clear. Hepatobiliary: Scattered hepatic cysts measuring up to 8 mm in segment 7 (series 5/image 17). Gallbladder is unremarkable. No intrahepatic or extrahepatic ductal dilatation. Pancreas:  Within normal limits. Spleen:  Within normal limits.  Adrenals/Urinary Tract:  Adrenal glands are within normal limits. Numerous bilateral renal cysts of varying sizes and complexity, measuring up to 6.2 cm in the anterior left upper kidney (series 5/image 23) and 4.7 cm in the lateral right lower kidney (series 5/image 33), compatible with the patient's known polycystic kidney disease. While many of these cysts are simple (Bosniak I), some of these cysts demonstrate intrinsic T1 hyperintensity/hemorrhage, favoring hemorrhagic cysts (likely Bosniak II). For example, there is a dominant 14 mm lesion inferiorly in the left kidney (series 13/image 39). However, some of these lesions are irregular, including a dominant 2.1 cm lesion inferiorly in the right kidney (series 13/image 85). Hemorrhage within a solid mass such as papillary renal cell carcinoma would be difficult to exclude in the absence of contrast administration. No hydronephrosis. Stomach/Bowel: Stomach is within normal limits. Visualized bowel is unremarkable. Vascular/Lymphatic:  No evidence of abdominal aortic aneurysm. No suspicious abdominal lymphadenopathy. Other:  No abdominal ascites. Musculoskeletal: No focal osseous lesions. IMPRESSION: Numerous renal cysts of varying sizes and complexity, compatible with the patient's known polycystic kidney disease. Some of these lesions are hemorrhagic and remain incompletely characterized in the absence of intravenous contrast administration. While many likely reflect benign hemorrhagic cysts, some could reflect solid lesions with superimposed hemorrhage. This is considered the baseline evaluation. Notably, the patient had a normal GFR in October 2018 and (if this has not changed) would be eligible for MR contrast. As such, follow-up MR abdomen with/without contrast is suggested in 6 months. If the patient is not eligible for contrast administration, consider follow-up MRI abdomen without contrast in 6-12 months to assess for changes in size. Electronically  Signed   By: Julian Hy M.D.   On: 12/31/2017 12:29     Assessment & Plan:   Sharlett was seen today for abdominal pain.  Diagnoses and all orders for this visit:  DDD (degenerative disc disease), lumbar  Left sided sciatica  Lumbar spondylosis  Polycystic kidney disease  Chronic pain syndrome  Chronic, continuous use of opioids  Chronic bilateral low back pain with right-sided sciatica  Other orders -     Discontinue: HYDROcodone-acetaminophen (NORCO) 10-325 MG tablet; Take 1 tablet by mouth every 6 (six) hours as needed for severe pain. -     HYDROcodone-acetaminophen (NORCO) 10-325 MG tablet; Take 1 tablet by mouth every 6 (six) hours as needed for severe pain. -     Discontinue: Morphine Sulfate ER 60 MG T12A; Take 1 tablet by mouth 3 (three) times daily. -     Discontinue: Morphine Sulfate ER 60 MG T12A; Take 1 tablet by mouth 3 (three) times daily. -     Morphine Sulfate ER 60 MG T12A; Take 1 tablet by mouth 3 (three) times daily. -     cyclobenzaprine (FLEXERIL) 10 MG tablet; Take 1 tablet (10 mg total) by mouth at bedtime.        ----------------------------------------------------------------------------------------------------------------------  Problem List Items Addressed This Visit      Unprioritized   Chronic lower back pain  Relevant Medications   HYDROcodone-acetaminophen (NORCO) 10-325 MG tablet   Morphine Sulfate ER 60 MG T12A   cyclobenzaprine (FLEXERIL) 10 MG tablet   Chronic pain syndrome (Chronic)   Chronic, continuous use of opioids   Lumbar spondylosis (Chronic)   Relevant Medications   HYDROcodone-acetaminophen (NORCO) 10-325 MG tablet   Morphine Sulfate ER 60 MG T12A   cyclobenzaprine (FLEXERIL) 10 MG tablet    Other Visit Diagnoses    DDD (degenerative disc disease), lumbar    -  Primary   Relevant Medications   HYDROcodone-acetaminophen (NORCO) 10-325 MG tablet   Morphine Sulfate ER 60 MG T12A   cyclobenzaprine (FLEXERIL) 10  MG tablet   Left sided sciatica       Relevant Medications   cyclobenzaprine (FLEXERIL) 10 MG tablet   Polycystic kidney disease            ----------------------------------------------------------------------------------------------------------------------  1. DDD (degenerative disc disease), lumbar Continue core stretching strengthening as previously discussed.  We will also keep her on her basic medication at this time.  She is continued to do well with this medication and based on her narcotic assessment sheet she is deriving good functional improvement.  Refills will be given for December 13 and April 30, 2018 for her hydrocodone and MS Contin.  We have reviewed the [facility] practitioner database information and it is appropriate.  2. Left sided sciatica Continue core strengthening and stretching  3. Lumbar spondylosis   4. Polycystic kidney disease Tinea follow-up with nephrology  5. Chronic pain syndrome As above  6. Chronic, continuous use of opioids As above with return to clinic in 2 months  7. Chronic bilateral low back pain with right-sided sciatica     ----------------------------------------------------------------------------------------------------------------------  I have changed Exie Parody. Fontanez's cyclobenzaprine. I am also having her maintain her naloxone, tolvaptan, amLODipine, lisinopril, HYDROcodone-acetaminophen, and Morphine Sulfate ER.   Meds ordered this encounter  Medications  . DISCONTD: HYDROcodone-acetaminophen (NORCO) 10-325 MG tablet    Sig: Take 1 tablet by mouth every 6 (six) hours as needed for severe pain.    Dispense:  45 tablet    Refill:  0    Do not fill until [id number]  . HYDROcodone-acetaminophen (NORCO) 10-325 MG tablet    Sig: Take 1 tablet by mouth every 6 (six) hours as needed for severe pain.    Dispense:  45 tablet    Refill:  0    Do not fill until [id number]  . DISCONTD: Morphine Sulfate ER 60 MG T12A     Sig: Take 1 tablet by mouth 3 (three) times daily.    Dispense:  75 tablet    Refill:  0    Do not fill until [id number]  . DISCONTD: Morphine Sulfate ER 60 MG T12A    Sig: Take 1 tablet by mouth 3 (three) times daily.    Dispense:  75 tablet    Refill:  0    Do not fill until [id number]  . Morphine Sulfate ER 60 MG T12A    Sig: Take 1 tablet by mouth 3 (three) times daily.    Dispense:  75 tablet    Refill:  0    Do not fill until [id number]  . cyclobenzaprine (FLEXERIL) 10 MG tablet    Sig: Take 1 tablet (10 mg total) by mouth at bedtime.    Dispense:  30 tablet    Refill:  3   Patient's Medications  New Prescriptions   No medications on file  Previous Medications  AMLODIPINE (NORVASC) 5 MG TABLET    TAKE 1 TABLET(5 MG) BY MOUTH DAILY   LISINOPRIL (PRINIVIL,ZESTRIL) 10 MG TABLET    TAKE 1 TABLET(10 MG) BY MOUTH DAILY   NALOXONE (NARCAN) NASAL SPRAY 4 MG/0.1 ML    For excess sedation from opioids.   TOLVAPTAN (JYNARQUE) 15 MG TABS TABLET    Take 15 mg by mouth 2 (two) times daily. 45 mg in am 15 mg in pm  Modified Medications   Modified Medication Previous Medication   CYCLOBENZAPRINE (FLEXERIL) 10 MG TABLET cyclobenzaprine (FLEXERIL) 10 MG tablet      Take 1 tablet (10 mg total) by mouth at bedtime.    TAKE 1 TABLET BY MOUTH AT BEDTIME   HYDROCODONE-ACETAMINOPHEN (NORCO) 10-325 MG TABLET HYDROcodone-acetaminophen (NORCO) 10-325 MG tablet      Take 1 tablet by mouth every 6 (six) hours as needed for severe pain.    Take 1 tablet by mouth every 6 (six) hours as needed for severe pain.   MORPHINE SULFATE ER 60 MG T12A Morphine Sulfate ER 60 MG T12A      Take 1 tablet by mouth 3 (three) times daily.    Take 1 tablet by mouth 3 (three) times daily.  Discontinued Medications   No medications on file   ----------------------------------------------------------------------------------------------------------------------  Follow-up: Return in about 2 months (around 05/29/2018) for  evaluation, med refill.    Molli Barrows, MD

## 2018-03-28 NOTE — Patient Instructions (Signed)
You were given 2 prescriptions each for Hydrocodone and MS Contin.

## 2018-03-28 NOTE — Progress Notes (Signed)
Nursing Pain Medication Assessment:  Safety precautions to be maintained throughout the outpatient stay will include: orient to surroundings, keep bed in low position, maintain call bell within reach at all times, provide assistance with transfer out of bed and ambulation.  Medication Inspection Compliance: Pill count conducted under aseptic conditions, in front of the patient. Neither the pills nor the bottle was removed from the patient's sight at any time. Once count was completed pills were immediately returned to the patient in their original bottle.  Medication #1: Morphine ER (MSContin) Pill/Patch Count: 7 of 75 pills remain Pill/Patch Appearance: Markings consistent with prescribed medication Bottle Appearance: Standard pharmacy container. Clearly labeled. Filled Date:11/13/ 2019 Last Medication intake:  Today  Medication #2: Hydrocodone/APAP Pill/Patch Count: 4 of 45 pills remain Pill/Patch Appearance: Markings consistent with prescribed medication Bottle Appearance: Standard pharmacy container. Clearly labeled. Filled Date: 11/13 / 2018 Last Medication intake:  Today

## 2018-04-14 ENCOUNTER — Ambulatory Visit: Payer: Medicare Other

## 2018-04-17 ENCOUNTER — Ambulatory Visit: Payer: Self-pay

## 2018-04-20 DIAGNOSIS — Q612 Polycystic kidney, adult type: Secondary | ICD-10-CM | POA: Diagnosis not present

## 2018-04-20 DIAGNOSIS — N200 Calculus of kidney: Secondary | ICD-10-CM | POA: Diagnosis not present

## 2018-04-20 DIAGNOSIS — I10 Essential (primary) hypertension: Secondary | ICD-10-CM | POA: Diagnosis not present

## 2018-04-21 ENCOUNTER — Ambulatory Visit (INDEPENDENT_AMBULATORY_CARE_PROVIDER_SITE_OTHER): Payer: Medicare Other

## 2018-04-21 VITALS — BP 126/58 | HR 105 | Temp 99.1°F | Ht 64.0 in | Wt 173.8 lb

## 2018-04-21 DIAGNOSIS — Z0000 Encounter for general adult medical examination without abnormal findings: Secondary | ICD-10-CM

## 2018-04-21 DIAGNOSIS — Z114 Encounter for screening for human immunodeficiency virus [HIV]: Secondary | ICD-10-CM | POA: Diagnosis not present

## 2018-04-21 NOTE — Progress Notes (Signed)
Subjective:   Megan Brock is a 38 y.o. female who presents for an Initial Medicare Annual Wellness Visit.  Review of Systems    N/A  Cardiac Risk Factors include: smoking/ tobacco exposure;hypertension     Objective:    Today's Vitals   04/21/18 1047 04/21/18 1108  BP: (!) 126/58   Pulse: (!) 114 (!) 105  Temp: 99.1 F (37.3 C)   TempSrc: Oral   Weight: 173 lb 12.8 oz (78.8 kg)   Height: 5\' 4"  (1.626 m)   PainSc: 0-No pain    Body mass index is 29.83 kg/m.  Advanced Directives 04/21/2018 10/18/2017 06/07/2017 03/31/2017 02/01/2017 12/07/2016 10/11/2016  Does Patient Have a Medical Advance Directive? No No No No No No No  Would patient like information on creating a medical advance directive? No - Patient declined No - Patient declined No - Patient declined - - No - Patient declined -    Current Medications (verified) Outpatient Encounter Medications as of 04/21/2018  Medication Sig  . amLODipine (NORVASC) 5 MG tablet TAKE 1 TABLET(5 MG) BY MOUTH DAILY  . cyclobenzaprine (FLEXERIL) 10 MG tablet Take 1 tablet (10 mg total) by mouth at bedtime.  Marland Kitchen HYDROcodone-acetaminophen (NORCO) 10-325 MG tablet Take 1 tablet by mouth every 6 (six) hours as needed for severe pain.  Marland Kitchen lisinopril (PRINIVIL,ZESTRIL) 10 MG tablet TAKE 1 TABLET(10 MG) BY MOUTH DAILY  . Morphine Sulfate ER 60 MG T12A Take 1 tablet by mouth 3 (three) times daily. (Patient taking differently: Take 1 tablet by mouth 3 (three) times daily. As needed)  . Tolvaptan (JYNARQUE) 60 & 30 MG TBPK Take 1 tablet by mouth 2 (two) times daily. Takes 60 mg in the AM and 30 mg 8 hours later.  . naloxone (NARCAN) nasal spray 4 mg/0.1 mL For excess sedation from opioids. (Patient not taking: Reported on 04/21/2018)  . tolvaptan (JYNARQUE) 15 MG TABS tablet Take 15 mg by mouth 2 (two) times daily. 45 mg in am 15 mg in pm   No facility-administered encounter medications on file as of 04/21/2018.     Allergies (verified) Patient has no  known allergies.   History: Past Medical History:  Diagnosis Date  . Hematuria   . History of chicken pox   . Hypertension   . Nephrolithiasis   . Polycystic kidney disease    Past Surgical History:  Procedure Laterality Date  . CESAREAN SECTION    . LITHOTRIPSY     has had 3 lithotripsy  . TUBAL LIGATION     Family History  Problem Relation Age of Onset  . Arthritis Mother   . Hypertension Mother   . Arthritis Father   . Cancer Father   . Early death Father   . Heart disease Father   . Hypertension Father   . Kidney disease Father        POLYCYSTIC KIDNEY DISEASE  . Heart attack Father   . Hypertension Brother    Social History   Socioeconomic History  . Marital status: Married    Spouse name: Not on file  . Number of children: 2  . Years of education: Not on file  . Highest education level: Some college, no degree  Occupational History  . Occupation: On disability  Social Needs  . Financial resource strain: Not hard at all  . Food insecurity:    Worry: Never true    Inability: Never true  . Transportation needs:    Medical: No  Non-medical: No  Tobacco Use  . Smoking status: Current Every Day Smoker    Packs/day: 0.50    Types: Cigarettes  . Smokeless tobacco: Never Used  . Tobacco comment: 1/2-1 pd since age 38  Substance and Sexual Activity  . Alcohol use: No    Alcohol/week: 0.0 standard drinks  . Drug use: No  . Sexual activity: Not on file  Lifestyle  . Physical activity:    Days per week: 3 days    Minutes per session: 30 min  . Stress: Not at all  Relationships  . Social connections:    Talks on phone: Patient refused    Gets together: Patient refused    Attends religious service: Patient refused    Active member of club or organization: Patient refused    Attends meetings of clubs or organizations: Patient refused    Relationship status: Patient refused  Other Topics Concern  . Not on file  Social History Narrative   Two  children    Tobacco Counseling Ready to quit: Yes Counseling given: No Comment: 1/2-1 pd since age 38   Clinical Intake:  Pre-visit preparation completed: Yes  Pain : No/denies pain Pain Score: 0-No pain(Has chronic back pain from kidneys. )     Nutritional Status: BMI 25 -29 Overweight Nutritional Risks: None Diabetes: No  How often do you need to have someone help you when you read instructions, pamphlets, or other written materials from your doctor or pharmacy?: 1 - Never  Interpreter Needed?: No  Information entered by :: Megan Brock LLCMmarkoski, LPN   Activities of Daily Living In your present state of health, do you have any difficulty performing the following activities: 04/21/2018  Hearing? Y  Comment Has chronic hearing loss in both ears. Does not f/u with an audiologist.   Vision? N  Comment Wears eye glasses for distance.   Difficulty concentrating or making decisions? N  Walking or climbing stairs? N  Dressing or bathing? N  Doing errands, shopping? N  Preparing Food and eating ? N  Using the Toilet? N  In the past six months, have you accidently leaked urine? N  Do you have problems with loss of bowel control? N  Managing your Medications? N  Managing your Finances? N  Housekeeping or managing your Housekeeping? N  Some recent data might be hidden     Immunizations and Health Maintenance Immunization History  Administered Date(s) Administered  . Influenza Split 02/23/2007, 01/05/2010  . Influenza,inj,Quad PF,6+ Mos 12/08/2016, 01/30/2018  . Tdap 12/10/2016   Health Maintenance Due  Topic Date Due  . HIV Screening  06/09/1997    Patient Care Team: Malva LimesFisher, Donald E, MD as PCP - General (Family Medicine) Pernell DupreAdams, Currie ParisJames G, MD as Consulting Physician (Anesthesiology) Mady HaagensenLateef, Munsoor, MD (Internal Medicine) Vanna ScotlandBrandon, Rubie, MD as Consulting Physician (Urology)  Indicate any recent Medical Services you may have received from other than [facility] providers in the  past year (date may be approximate).     Assessment:   This is a routine wellness examination for BohemiaAshley.  Hearing/Vision screen No exam data present  Dietary issues and exercise activities discussed: Current Exercise Habits: Home exercise routine, Type of exercise: walking, Time (Minutes): 30, Frequency (Times/Week): 3, Weekly Exercise (Minutes/Week): 90, Intensity: Mild, Exercise limited by: None identified  Goals    . DIET - DECREASE SODA OR JUICE INTAKE     Continue cutting back on soda intake to reach a goal of 1 soda a day.  Depression Screen PHQ 2/9 Scores 04/21/2018 03/28/2018 01/25/2018 11/23/2017 10/18/2017 09/29/2017 08/01/2017  PHQ - 2 Score 0 0 0 0 0 0 0    Fall Risk Fall Risk  04/21/2018 03/28/2018 01/25/2018 11/23/2017 10/18/2017  Falls in the past year? 0 0 No No No  Number falls in past yr: 0 - - - -  Injury with Fall? 0 - - - -    FALL RISK PREVENTION PERTAINING TO THE HOME:  Any stairs in or around the home WITH handrails? No  Home free of loose throw rugs in walkways, pet beds, electrical cords, etc? Yes  Adequate lighting in your home to reduce risk of falls? Yes   ASSISTIVE DEVICES UTILIZED TO PREVENT FALLS:  Life alert? No  Use of a cane, walker or w/c? No  Grab bars in the bathroom? No  Shower chair or bench in shower? No  Elevated toilet seat or a handicapped toilet? No    TIMED UP AND GO:  Was the test performed? No .     Cognitive Function: Declined today.         Screening Tests Health Maintenance  Topic Date Due  . HIV Screening  06/09/1997  . PAP SMEAR-Modifier  01/07/2020  . TETANUS/TDAP  12/11/2026  . INFLUENZA VACCINE  Completed     Tdap: Up to date  Flu Vaccine: Up to date    Cancer Screenings:  Lung Cancer Screening: (Low Dose CT Chest recommended if Age 29-80 years, 30 pack-year currently smoking OR have quit w/in 15years.) does not qualify.    Additional Screening:  Vision Screening: Recommended annual  ophthalmology exams for early detection of glaucoma and other disorders of the eye.  Dental Screening: Recommended annual dental exams for proper oral hygiene  Community Resource Referral:  CRR required this visit?  No       Plan:  I have personally reviewed and addressed the Medicare Annual Wellness questionnaire and have noted the following in the patient's chart:  A. Medical and social history B. Use of alcohol, tobacco or illicit drugs  C. Current medications and supplements D. Functional ability and status E.  Nutritional status F.  Physical activity G. Advance directives H. List of other physicians I.  Hospitalizations, surgeries, and ER visits in previous 12 months J.  Vitals K. Screenings such as hearing and vision if needed, cognitive and depression L. Referrals and appointments - none  In addition, I have reviewed and discussed with patient certain preventive protocols, quality metrics, and best practice recommendations. A written personalized care plan for preventive services as well as general preventive health recommendations were provided to patient.  See attached scanned questionnaire for additional information.   Signed,  Hyacinth MeekerMckenzie Burris Matherne, LPN Nurse Health Advisor   Nurse Recommendations: None.

## 2018-04-21 NOTE — Patient Instructions (Signed)
Megan Brock , Thank you for taking time to come for your Medicare Wellness Visit. I appreciate your ongoing commitment to your health goals. Please review the following plan we discussed and let me know if I can assist you in the future.   Screening recommendations/referrals: Colonoscopy: Not required at this time.  Mammogram: Not required at this time.  Bone Density: Not required at this time.  Recommended yearly ophthalmology/optometry visit for glaucoma screening and checkup Recommended yearly dental visit for hygiene and checkup  Vaccinations: Influenza vaccine: Up to date Pneumococcal vaccine: Not required at this time.  Tdap vaccine: Up to date, due 11/2026 Shingles vaccine: Not required at this time.   Advanced directives: Advance directive discussed with you today. Even though you declined this today please call our office should you change your mind and we can give you the proper paperwork for you to fill out.  Conditions/risks identified: Smoking cessation; Continue cutting back on soda intake to reach a goal of 1 soda a day.   Next appointment: 05/10/18 @ 3:00 PM with Dr Caryn Section.   Preventive Care 40-64 Years, Female Preventive care refers to lifestyle choices and visits with your health care provider that can promote health and wellness. What does preventive care include?  A yearly physical exam. This is also called an annual well check.  Dental exams once or twice a year.  Routine eye exams. Ask your health care provider how often you should have your eyes checked.  Personal lifestyle choices, including:  Daily care of your teeth and gums.  Regular physical activity.  Eating a healthy diet.  Avoiding tobacco and drug use.  Limiting alcohol use.  Practicing safe sex.  Taking low-dose aspirin daily starting at age 32.  Taking vitamin and mineral supplements as recommended by your health care provider. What happens during an annual well check? The services  and screenings done by your health care provider during your annual well check will depend on your age, overall health, lifestyle risk factors, and family history of disease. Counseling  Your health care provider may ask you questions about your:  Alcohol use.  Tobacco use.  Drug use.  Emotional well-being.  Home and relationship well-being.  Sexual activity.  Eating habits.  Work and work Statistician.  Method of birth control.  Menstrual cycle.  Pregnancy history. Screening  You may have the following tests or measurements:  Height, weight, and BMI.  Blood pressure.  Lipid and cholesterol levels. These may be checked every 5 years, or more frequently if you are over 78 years old.  Skin check.  Lung cancer screening. You may have this screening every year starting at age 52 if you have a 30-pack-year history of smoking and currently smoke or have quit within the past 15 years.  Fecal occult blood test (FOBT) of the stool. You may have this test every year starting at age 35.  Flexible sigmoidoscopy or colonoscopy. You may have a sigmoidoscopy every 5 years or a colonoscopy every 10 years starting at age 26.  Hepatitis C blood test.  Hepatitis B blood test.  Sexually transmitted disease (STD) testing.  Diabetes screening. This is done by checking your blood sugar (glucose) after you have not eaten for a while (fasting). You may have this done every 1-3 years.  Mammogram. This may be done every 1-2 years. Talk to your health care provider about when you should start having regular mammograms. This may depend on whether you have a family history of breast  cancer.  BRCA-related cancer screening. This may be done if you have a family history of breast, ovarian, tubal, or peritoneal cancers.  Pelvic exam and Pap test. This may be done every 3 years starting at age 22. Starting at age 60, this may be done every 5 years if you have a Pap test in combination with an HPV  test.  Bone density scan. This is done to screen for osteoporosis. You may have this scan if you are at high risk for osteoporosis. Discuss your test results, treatment options, and if necessary, the need for more tests with your health care provider. Vaccines  Your health care provider may recommend certain vaccines, such as:  Influenza vaccine. This is recommended every year.  Tetanus, diphtheria, and acellular pertussis (Tdap, Td) vaccine. You may need a Td booster every 10 years.  Zoster vaccine. You may need this after age 39.  Pneumococcal 13-valent conjugate (PCV13) vaccine. You may need this if you have certain conditions and were not previously vaccinated.  Pneumococcal polysaccharide (PPSV23) vaccine. You may need one or two doses if you smoke cigarettes or if you have certain conditions. Talk to your health care provider about which screenings and vaccines you need and how often you need them. This information is not intended to replace advice given to you by your health care provider. Make sure you discuss any questions you have with your health care provider. Document Released: 05/02/2015 Document Revised: 12/24/2015 Document Reviewed: 02/04/2015 Elsevier Interactive Patient Education  2017 [employer] Prevention in the Home Falls can cause injuries. They can happen to people of all ages. There are many things you can do to make your home safe and to help prevent falls. What can I do on the outside of my home?  Regularly fix the edges of walkways and driveways and fix any cracks.  Remove anything that might make you trip as you walk through a door, such as a raised step or threshold.  Trim any bushes or trees on the path to your home.  Use bright outdoor lighting.  Clear any walking paths of anything that might make someone trip, such as rocks or tools.  Regularly check to see if handrails are loose or broken. Make sure that both sides of any steps have  handrails.  Any raised decks and porches should have guardrails on the edges.  Have any leaves, snow, or ice cleared regularly.  Use sand or salt on walking paths during winter.  Clean up any spills in your garage right away. This includes oil or grease spills. What can I do in the bathroom?  Use night lights.  Install grab bars by the toilet and in the tub and shower. Do not use towel bars as grab bars.  Use non-skid mats or decals in the tub or shower.  If you need to sit down in the shower, use a plastic, non-slip stool.  Keep the floor dry. Clean up any water that spills on the floor as soon as it happens.  Remove soap buildup in the tub or shower regularly.  Attach bath mats securely with double-sided non-slip rug tape.  Do not have throw rugs and other things on the floor that can make you trip. What can I do in the bedroom?  Use night lights.  Make sure that you have a light by your bed that is easy to reach.  Do not use any sheets or blankets that are too big for your  bed. They should not hang down onto the floor.  Have a firm chair that has side arms. You can use this for support while you get dressed.  Do not have throw rugs and other things on the floor that can make you trip. What can I do in the kitchen?  Clean up any spills right away.  Avoid walking on wet floors.  Keep items that you use a lot in easy-to-reach places.  If you need to reach something above you, use a strong step stool that has a grab bar.  Keep electrical cords out of the way.  Do not use floor polish or wax that makes floors slippery. If you must use wax, use non-skid floor wax.  Do not have throw rugs and other things on the floor that can make you trip. What can I do with my stairs?  Do not leave any items on the stairs.  Make sure that there are handrails on both sides of the stairs and use them. Fix handrails that are broken or loose. Make sure that handrails are as long as  the stairways.  Check any carpeting to make sure that it is firmly attached to the stairs. Fix any carpet that is loose or worn.  Avoid having throw rugs at the top or bottom of the stairs. If you do have throw rugs, attach them to the floor with carpet tape.  Make sure that you have a light switch at the top of the stairs and the bottom of the stairs. If you do not have them, ask someone to add them for you. What else can I do to help prevent falls?  Wear shoes that:  Do not have high heels.  Have rubber bottoms.  Are comfortable and fit you well.  Are closed at the toe. Do not wear sandals.  If you use a stepladder:  Make sure that it is fully opened. Do not climb a closed stepladder.  Make sure that both sides of the stepladder are locked into place.  Ask someone to hold it for you, if possible.  Clearly mark and make sure that you can see:  Any grab bars or handrails.  First and last steps.  Where the edge of each step is.  Use tools that help you move around (mobility aids) if they are needed. These include:  Canes.  Walkers.  Scooters.  Crutches.  Turn on the lights when you go into a dark area. Replace any light bulbs as soon as they burn out.  Set up your furniture so you have a clear path. Avoid moving your furniture around.  If any of your floors are uneven, fix them.  If there are any pets around you, be aware of where they are.  Review your medicines with your doctor. Some medicines can make you feel dizzy. This can increase your chance of falling. Ask your doctor what other things that you can do to help prevent falls. This information is not intended to replace advice given to you by your health care provider. Make sure you discuss any questions you have with your health care provider. Document Released: 01/30/2009 Document Revised: 09/11/2015 Document Reviewed: 05/10/2014 Elsevier Interactive Patient Education  2017 [employer].

## 2018-05-10 ENCOUNTER — Encounter: Payer: Medicare Other | Admitting: Family Medicine

## 2018-05-15 DIAGNOSIS — R809 Proteinuria, unspecified: Secondary | ICD-10-CM | POA: Diagnosis not present

## 2018-05-15 DIAGNOSIS — N200 Calculus of kidney: Secondary | ICD-10-CM | POA: Diagnosis not present

## 2018-05-15 DIAGNOSIS — I10 Essential (primary) hypertension: Secondary | ICD-10-CM | POA: Diagnosis not present

## 2018-05-15 DIAGNOSIS — Q612 Polycystic kidney, adult type: Secondary | ICD-10-CM | POA: Diagnosis not present

## 2018-05-25 ENCOUNTER — Encounter: Payer: Medicare Other | Admitting: Anesthesiology

## 2018-05-29 ENCOUNTER — Other Ambulatory Visit: Payer: Self-pay

## 2018-05-29 ENCOUNTER — Encounter: Payer: Self-pay | Admitting: Nurse Practitioner

## 2018-05-29 ENCOUNTER — Ambulatory Visit: Payer: Medicare Other | Attending: Nurse Practitioner | Admitting: Nurse Practitioner

## 2018-05-29 VITALS — BP 148/98 | HR 113 | Temp 98.4°F | Ht 64.0 in | Wt 173.0 lb

## 2018-05-29 DIAGNOSIS — M5442 Lumbago with sciatica, left side: Secondary | ICD-10-CM

## 2018-05-29 DIAGNOSIS — R809 Proteinuria, unspecified: Secondary | ICD-10-CM | POA: Diagnosis not present

## 2018-05-29 DIAGNOSIS — Q612 Polycystic kidney, adult type: Secondary | ICD-10-CM | POA: Diagnosis not present

## 2018-05-29 DIAGNOSIS — I10 Essential (primary) hypertension: Secondary | ICD-10-CM | POA: Diagnosis not present

## 2018-05-29 DIAGNOSIS — M47816 Spondylosis without myelopathy or radiculopathy, lumbar region: Secondary | ICD-10-CM | POA: Diagnosis not present

## 2018-05-29 DIAGNOSIS — G8929 Other chronic pain: Secondary | ICD-10-CM

## 2018-05-29 DIAGNOSIS — Z79891 Long term (current) use of opiate analgesic: Secondary | ICD-10-CM

## 2018-05-29 DIAGNOSIS — G894 Chronic pain syndrome: Secondary | ICD-10-CM | POA: Insufficient documentation

## 2018-05-29 MED ORDER — HYDROCODONE-ACETAMINOPHEN 10-325 MG PO TABS: 1.0000 | ORAL_TABLET | Freq: Four times a day (QID) | ORAL | 0 refills | Status: DC | PRN

## 2018-05-29 MED ORDER — MORPHINE SULFATE ER 60 MG PO TBCR: 60.0000 mg | EXTENDED_RELEASE_TABLET | Freq: Three times a day (TID) | ORAL | 0 refills | Status: DC | PRN

## 2018-05-29 MED ORDER — HYDROCODONE-ACETAMINOPHEN 10-325 MG PO TABS: 1.0000 | ORAL_TABLET | Freq: Four times a day (QID) | ORAL | 0 refills | Status: AC | PRN

## 2018-05-29 MED ORDER — CYCLOBENZAPRINE HCL 10 MG PO TABS: 10.0000 mg | ORAL_TABLET | Freq: Every day | ORAL | 1 refills | Status: DC

## 2018-05-29 MED ORDER — MORPHINE SULFATE ER 60 MG PO T12A: 1.0000 | EXTENDED_RELEASE_TABLET | Freq: Three times a day (TID) | ORAL | 0 refills | Status: DC | PRN

## 2018-05-29 NOTE — Patient Instructions (Signed)
____________________________________________________________________________________________  Medication Rules  Purpose: To inform patients, and their family members, of our rules and regulations.  Applies to: All patients receiving prescriptions (written or electronic).  Pharmacy of record: Pharmacy where electronic prescriptions will be sent. If written prescriptions are taken to a different pharmacy, please inform the nursing staff. The pharmacy listed in the electronic medical record should be the one where you would like electronic prescriptions to be sent.  Electronic prescriptions: In compliance with the [place] Strengthen Opioid Misuse Prevention (STOP) Act of 2017 (Session Law 2017-74/H243), effective April 19, 2018, all controlled substances must be electronically prescribed. Calling prescriptions to the pharmacy will cease to exist.  Prescription refills: Only during scheduled appointments. Applies to all prescriptions.  NOTE: The following applies primarily to controlled substances (Opioid* Pain Medications).   Patient's responsibilities: 1. Pain Pills: Bring all pain pills to every appointment (except for procedure appointments). 2. Pill Bottles: Bring pills in original pharmacy bottle. Always bring the newest bottle. Bring bottle, even if empty. 3. Medication refills: You are responsible for knowing and keeping track of what medications you take and those you need refilled. The day before your appointment: write a list of all prescriptions that need to be refilled. The day of the appointment: give the list to the admitting nurse. Prescriptions will be written only during appointments. No prescriptions will be written on procedure days. If you forget a medication: it will not be "Called in", "Faxed", or "electronically sent". You will need to get another appointment to get these prescribed. No early refills. Do not call asking to have your prescription filled  early. 4. Prescription Accuracy: You are responsible for carefully inspecting your prescriptions before leaving our office. Have the discharge nurse carefully go over each prescription with you, before taking them home. Make sure that your name is accurately spelled, that your address is correct. Check the name and dose of your medication to make sure it is accurate. Check the number of pills, and the written instructions to make sure they are clear and accurate. Make sure that you are given enough medication to last until your next medication refill appointment. 5. Taking Medication: Take medication as prescribed. When it comes to controlled substances, taking less pills or less frequently than prescribed is permitted and encouraged. Never take more pills than instructed. Never take medication more frequently than prescribed.  6. Inform other Doctors: Always inform, all of your healthcare providers, of all the medications you take. 7. Pain Medication from other Providers: You are not allowed to accept any additional pain medication from any other Doctor or Healthcare provider. There are two exceptions to this rule. (see below) In the event that you require additional pain medication, you are responsible for notifying us, as stated below. 8. Medication Agreement: You are responsible for carefully reading and following our Medication Agreement. This must be signed before receiving any prescriptions from our practice. Safely store a copy of your signed Agreement. Violations to the Agreement will result in no further prescriptions. (Additional copies of our Medication Agreement are available upon request.) 9. Laws, Rules, & Regulations: All patients are expected to follow all Federal and State Laws, Statutes, Rules, & Regulations. Ignorance of the Laws does not constitute a valid excuse. The use of any illegal substances is prohibited. 10. Adopted CDC guidelines & recommendations: Target dosing levels will be  at or below 60 MME/day. Use of benzodiazepines** is not recommended.  Exceptions: There are only two exceptions to the rule of not   receiving pain medications from other Healthcare Providers. 1. Exception #1 (Emergencies): In the event of an emergency (i.e.: accident requiring emergency care), you are allowed to receive additional pain medication. However, you are responsible for: As soon as you are able, call our office [phone], at any time of the day or night, and leave a message stating your name, the date and nature of the emergency, and the name and dose of the medication prescribed. In the event that your call is answered by a member of our staff, make sure to document and save the date, time, and the name of the person that took your information.  2. Exception #2 (Planned Surgery): In the event that you are scheduled by another doctor or dentist to have any type of surgery or procedure, you are allowed (for a period no longer than 30 days), to receive additional pain medication, for the acute post-op pain. However, in this case, you are responsible for picking up a copy of our "Post-op Pain Management for Surgeons" handout, and giving it to your surgeon or dentist. This document is available at our office, and does not require an appointment to obtain it. Simply go to our office during business hours (Monday-Thursday from 8:00 AM to 4:00 PM) (Friday 8:00 AM to 12:00 Noon) or if you have a scheduled appointment with us, prior to your surgery, and ask for it by name. In addition, you will need to provide us with your name, name of your surgeon, type of surgery, and date of procedure or surgery.  *Opioid medications include: morphine, codeine, oxycodone, oxymorphone, hydrocodone, hydromorphone, meperidine, tramadol, tapentadol, buprenorphine, fentanyl, methadone. **Benzodiazepine medications include: diazepam (Valium), alprazolam (Xanax), clonazepam (Klonopine), lorazepam (Ativan), clorazepate  (Tranxene), chlordiazepoxide (Librium), estazolam (Prosom), oxazepam (Serax), temazepam (Restoril), triazolam (Halcion) (Last updated: 06/16/2017) ____________________________________________________________________________________________   ____________________________________________________________________________________________  Appointment Policy Summary  It is our goal and responsibility to provide the medical community with assistance in the evaluation and management of patients with chronic pain. Unfortunately our resources are limited. Because we do not have an unlimited amount of time, or available appointments, we are required to closely monitor and manage their use. The following rules exist to maximize their use:  Patient's responsibilities: 1. Punctuality:  At what time should I arrive? You should be physically present in our office 30 minutes before your scheduled appointment. Your scheduled appointment is with your assigned healthcare provider. However, it takes 5-10 minutes to be "checked-in", and another 15 minutes for the nurses to do the admission. If you arrive to our office at the time you were given for your appointment, you will end up being at least 20-25 minutes late to your appointment with the provider. 2. Tardiness:  What happens if I arrive only a few minutes after my scheduled appointment time? You will need to reschedule your appointment. The cutoff is your appointment time. This is why it is so important that you arrive at least 30 minutes before that appointment. If you have an appointment scheduled for 10:00 AM and you arrive at 10:01, you will be required to reschedule your appointment.  3. Plan ahead:  Always assume that you will encounter traffic on your way in. Plan for it. If you are dependent on a driver, make sure they understand these rules and the need to arrive early. 4. Other appointments and responsibilities:  Avoid scheduling any other appointments  before or after your pain clinic appointments.  5. Be prepared:  Write down everything that you need to discuss   with your healthcare provider and give this information to the admitting nurse. Write down the medications that you will need refilled. Bring your pills and bottles (even the empty ones), to all of your appointments, except for those where a procedure is scheduled. 6. No children or pets:  Find someone to take care of them. It is not appropriate to bring them in. 7. Scheduling changes:  We request "advanced notification" of any changes or cancellations. 8. Advanced notification:  Defined as a time period of more than 24 hours prior to the originally scheduled appointment. This allows for the appointment to be offered to other patients. 9. Rescheduling:  When a visit is rescheduled, it will require the cancellation of the original appointment. For this reason they both fall within the category of "Cancellations".  10. Cancellations:  They require advanced notification. Any cancellation less than 24 hours before the  appointment will be recorded as a "No Show". 11. No Show:  Defined as an unkept appointment where the patient failed to notify or declare to the practice their intention or inability to keep the appointment.  Corrective process for repeat offenders:  1. Tardiness: Three (3) episodes of rescheduling due to late arrivals will be recorded as one (1) "No Show". 2. Cancellation or reschedule: Three (3) cancellations or rescheduling will be recorded as one (1) "No Show". 3. "No Shows": Three (3) "No Shows" within a 12 month period will result in discharge from the practice. ____________________________________________________________________________________________   

## 2018-05-29 NOTE — Progress Notes (Signed)
Nursing Pain Medication Assessment:  Safety precautions to be maintained throughout the outpatient stay will include: orient to surroundings, keep bed in low position, maintain call bell within reach at all times, provide assistance with transfer out of bed and ambulation.  Medication Inspection Compliance: Pill count conducted under aseptic conditions, in front of the patient. Neither the pills nor the bottle was removed from the patient's sight at any time. Once count was completed pills were immediately returned to the patient in their original bottle.  Medication #1: Morphine IR Pill/Patch Count: 2 of 75 pills remain Pill/Patch Appearance: Markings consistent with prescribed medication Bottle Appearance: Standard pharmacy container. Clearly labeled. Filled Date: 1 / 62 / 2020 Last Medication intake:  Today  Medication #2: Hydrocodone/APAP Pill/Patch Count: 2 of 45 pills remain Pill/Patch Appearance: Markings consistent with prescribed medication Bottle Appearance: Standard pharmacy container. Clearly labeled. Filled Date: 1 / 45 / 2020 Last Medication intake:  Today

## 2018-05-29 NOTE — Progress Notes (Signed)
Patient's Name: Megan Brock  MRN: [id number]  Referring Provider: Birdie Sons, MD  DOB: [birth year]  PCP: Birdie Sons, MD  DOS: 05/29/2018  Note by: Dionisio David, NP  Service setting: Ambulatory outpatient  Specialty: Interventional Pain Management  Location: [facility] (AMB) Pain Management Facility    Patient type: Established   HPI  Reason for Visit: [name] is a 38 y.o. year old, female patient, who comes today with a chief complaint of Back Pain Last Appointment: She was last seen by me on Visit date not found. Pain Assessment: Today, [name] describes the severity of the Chronic pain as a 5 /10. She indicates the location/referral of the pain to be Back Lower, Right/Denies. Onset was: More than a month ago. The quality of pain is described as Constant, Aching. Temporal description, or timing of pain is: Constant. Possible modifying factors: medications, laying down, heating pad. [name] describes the pain effects on ADL as: limits my daily activities.  [name]'s  height is 5' 4"  (1.626 m) and weight is 173 lb (78.5 kg). Her temperature is 98.4 F (36.9 C). Her blood pressure is 148/98 (abnormal) and her pulse is 113 (abnormal). Her oxygen saturation is 95%. She has pain that goes down her right leg with numbness to the knee.  She is scheduled for an epidural steroid injection with Dr. Andree Elk Controlled Substance Pharmacotherapy Assessment REMS (Risk Evaluation and Mitigation Strategy)  Analgesic: Morphine sulfate extended release 60 mg twice daily with hydrocodone/acetaminophen 10/325 mg twice daily  MME/day: 155 mg/day. Chauncey Fischer, RN  05/29/2018  2:46 PM  Sign when Signing Visit Nursing Pain Medication Assessment:  Safety precautions to be maintained throughout the outpatient stay will include: orient to surroundings, keep bed in low position, maintain call bell within reach at all times, provide assistance with transfer out of bed and ambulation.  Medication  Inspection Compliance: Pill count conducted under aseptic conditions, in front of the patient. Neither the pills nor the bottle was removed from the patient's sight at any time. Once count was completed pills were immediately returned to the patient in their original bottle.  Medication #1: Morphine IR Pill/Patch Count: 2 of 75 pills remain Pill/Patch Appearance: Markings consistent with prescribed medication Bottle Appearance: Standard pharmacy container. Clearly labeled. Filled Date: 1 / 18 / 2020 Last Medication intake:  Today  Medication #2: Hydrocodone/APAP Pill/Patch Count: 2 of 45 pills remain Pill/Patch Appearance: Markings consistent with prescribed medication Bottle Appearance: Standard pharmacy container. Clearly labeled. Filled Date: 1 / 51 / 2020 Last Medication intake:  Today   Pharmacokinetics: Liberation and absorption (onset of action): WNL Distribution (time to peak effect): WNL Metabolism and excretion (duration of action): WNL         Pharmacodynamics: Desired effects: Analgesia: [name] reports >50% benefit. Functional ability: Patient reports that medication allows her to accomplish basic ADLs Clinically meaningful improvement in function (CMIF): Sustained CMIF goals met Perceived effectiveness: Described as relatively effective, allowing for increase in activities of daily living (ADL) Undesirable effects: Side-effects or Adverse reactions: None reported Monitoring: [place] PMP: Online review of the past 50-monthperiod conducted. Compliant with practice rules and regulations Last UDS on record: Summary  Date Value Ref Range Status  09/29/2017 FINAL  Final    Comment:    ==================================================================== TOXASSURE SELECT 13 (MW) ==================================================================== Test  Result       Flag       Units Drug Present and Declared for Prescription Verification    Morphine                       [phone]        EXPECTED   ng/mg creat   Normorphine                    3243         EXPECTED   ng/mg creat    Potential sources of large amounts of morphine in the absence of    codeine include administration of morphine or use of heroin.    Normorphine is an expected metabolite of morphine.   Hydrocodone                    447          EXPECTED   ng/mg creat   Hydromorphone                  570          EXPECTED   ng/mg creat   Dihydrocodeine                 91           EXPECTED   ng/mg creat   Norhydrocodone                 845          EXPECTED   ng/mg creat    Sources of hydrocodone include scheduled prescription    medications. Hydromorphone, dihydrocodeine and norhydrocodone are    expected metabolites of hydrocodone. Hydromorphone and    dihydrocodeine are also available as scheduled prescription    medications. ==================================================================== Test                      Result    Flag   Units      Ref Range   Creatinine              150              mg/dL      >=20 ==================================================================== Declared Medications:  The flagging and interpretation on this report are based on the  following declared medications.  Unexpected results may arise from  inaccuracies in the declared medications.  **Note: The testing scope of this panel includes these medications:  Hydrocodone (Norco)  Morphine (Morphine Sulfate)  **Note: The testing scope of this panel does not include following  reported medications:  Acetaminophen (Norco)  Amlodipine (Norvasc)  Bupropion (Wellbutrin)  Cyclobenzaprine (Flexeril)  Lisinopril  Naloxone (Narcan) ==================================================================== For clinical consultation, please call [phone]. ====================================================================    UDS interpretation: Compliant          Medication  Assessment Form: Reviewed. Patient indicates being compliant with therapy Treatment compliance: Compliant Risk Assessment Profile: Aberrant behavior: See initial evaluations. None observed or detected today Comorbid factors increasing risk of overdose: See initial evaluation. No additional risks detected today Opioid risk tool (ORT):  Opioid Risk  05/29/2018  Alcohol 0  Illegal Drugs 0  Rx Drugs 0  Alcohol 0  Illegal Drugs 0  Rx Drugs 0  Age between 16-45 years  0  History of Preadolescent Sexual Abuse 0  Psychological Disease 0  Depression 0  Opioid Risk Tool Scoring 0  Opioid Risk Interpretation Low Risk  ORT Scoring interpretation table:  Score <3 = Low Risk for SUD  Score between 4-7 = Moderate Risk for SUD  Score >8 = High Risk for Opioid Abuse   Risk of substance use disorder (SUD): Moderate  Risk Mitigation Strategies:  Patient Counseling: Covered Patient-Prescriber Agreement (PPA): Present and active  Notification to other healthcare providers: Done  Pharmacologic Plan: No change in therapy, at this time.             ROS  Constitutional: Denies any fever or chills Gastrointestinal: No reported hemesis, hematochezia, vomiting, or acute GI distress Musculoskeletal: Denies any acute onset joint swelling, redness, loss of ROM, or weakness Neurological: No reported episodes of acute onset apraxia, aphasia, dysarthria, agnosia, amnesia, paralysis, loss of coordination, or loss of consciousness  Medication Review  HYDROcodone-acetaminophen, Morphine Sulfate ER, Tolvaptan, amLODipine, cyclobenzaprine, lisinopril, morphine, naloxone, and tolvaptan  History Review  Allergy: Ms. Denley has No Known Allergies. Drug: Ms. Stegner  reports no history of drug use. Alcohol:  reports no history of alcohol use. Tobacco:  reports that she has been smoking cigarettes. She has been smoking about 0.50 packs per day. She has never used smokeless tobacco. Social: Ms. Cobos  reports  that she has been smoking cigarettes. She has been smoking about 0.50 packs per day. She has never used smokeless tobacco. She reports that she does not drink alcohol or use drugs. Medical:  has a past medical history of Hematuria, History of chicken pox, Hypertension, Nephrolithiasis, and Polycystic kidney disease. Surgical: Ms. Gutzwiller  has a past surgical history that includes Cesarean section; Tubal ligation; and Lithotripsy. Family: family history includes Arthritis in her father and mother; Cancer in her father; Early death in her father; Heart attack in her father; Heart disease in her father; Hypertension in her brother, father, and mother; Kidney disease in her father. Problem List: Ms. Mayville has Chronic lower back pain; Chronic pain syndrome; and Lumbar spondylosis on their pertinent problem list.  Lab Review  Kidney Function Lab Results  Component Value Date   BUN 18 02/08/2017   CREATININE 0.98 95/18/8416   BCR NOT APPLICABLE 60/63/0160   GFRAA 87 02/08/2017   GFRNONAA 75 02/08/2017  Liver Function Lab Results  Component Value Date   AST 12 02/08/2017   ALT 10 02/08/2017   ALBUMIN 4.4 08/18/2015  Note: Above Lab results reviewed.  Imaging Review  Note: Above imaging results reviewed.        Physical Exam  General appearance: Well nourished, well developed, and well hydrated. In no apparent acute distress Mental status: Alert, oriented x 3 (person, place, & time)       Respiratory: No evidence of acute respiratory distress Eyes: PERLA Vitals: BP (!) 148/98   Pulse (!) 113   Temp 98.4 F (36.9 C)   Ht '5\' 4"'$  (1.626 m)   Wt 173 lb (78.5 kg)   LMP 05/29/2018   SpO2 95%   BMI 29.70 kg/m  BMI: Estimated body mass index is 29.7 kg/m as calculated from the following:   Height as of this encounter: '5\' 4"'$  (1.626 m).   Weight as of this encounter: 173 lb (78.5 kg). Ideal: Ideal body weight: 54.7 kg (120 lb 9.5 oz) Adjusted ideal body weight: 64.2 kg (141 lb 8.9  oz) Lumbar Spine Area Exam  Skin & Axial Inspection: No masses, redness, or swelling Alignment: Symmetrical Functional ROM: Unrestricted ROM       Stability: No instability detected Muscle Tone/Strength: Functionally intact. No obvious neuro-muscular  anomalies detected. Sensory (Neurological): Unimpaired Palpation: Uncomfortable       Provocative Tests: Hyperextension/rotation test: deferred today       Lumbar quadrant test (Kemp's test): deferred today       Lateral bending test: deferred today       Patrick's Maneuver: deferred today                    Lower Extremity Exam    Side: Right lower extremity  Side: Left lower extremity  Stability: No instability observed          Stability: No instability observed          Skin & Extremity Inspection: Skin color, temperature, and hair growth are WNL. No peripheral edema or cyanosis. No masses, redness, swelling, asymmetry, or associated skin lesions. No contractures.  Skin & Extremity Inspection: Skin color, temperature, and hair growth are WNL. No peripheral edema or cyanosis. No masses, redness, swelling, asymmetry, or associated skin lesions. No contractures.  Functional ROM: Unrestricted ROM                  Functional ROM: Unrestricted ROM                  Muscle Tone/Strength: Functionally intact. No obvious neuro-muscular anomalies detected.  Muscle Tone/Strength: Functionally intact. No obvious neuro-muscular anomalies detected.  Sensory (Neurological): Referred pain pattern        Sensory (Neurological): Unimpaired                 Assessment   Status Diagnosis  Persistent Controlled Controlled 1. Lumbar spondylosis   2. Chronic bilateral low back pain with left-sided sciatica   3. Chronic pain syndrome   4. Long term current use of opiate analgesic      Updated Problems: No problems updated.  Plan of Care  Medications: I have changed Exie Parody. Domenico's HYDROcodone-acetaminophen, Morphine Sulfate ER, morphine, and  HYDROcodone-acetaminophen. I am also having her maintain her naloxone, JYNARQUE, amLODipine, lisinopril, Tolvaptan, and cyclobenzaprine.  Administered today: Exie Parody. Nolting had no medications administered during this visit.  Orders:  Orders Placed This Encounter  Procedures  . ToxASSURE Select 13 (MW), Urine    Volume: 30 ml(s). Minimum 3 ml of urine is needed. Document temperature of fresh sample. Indications: Long term (current) use of opiate analgesic (Y81.448)   Interventional options: Planned follow-up:   Right sided lumbar epidural steroid injection scheduled Return in about 2 months (around 07/28/2018) for MedMgmt, w/ Dr. Andree Elk.   Note by: Dionisio David, NP Date: 05/29/2018; Time: 10:14 AM

## 2018-06-03 LAB — TOXASSURE SELECT 13 (MW), URINE

## 2018-06-05 ENCOUNTER — Other Ambulatory Visit: Payer: Self-pay

## 2018-06-05 ENCOUNTER — Other Ambulatory Visit: Payer: Self-pay | Admitting: Anesthesiology

## 2018-06-05 ENCOUNTER — Encounter: Payer: Self-pay | Admitting: Anesthesiology

## 2018-06-05 ENCOUNTER — Ambulatory Visit
Admission: RE | Admit: 2018-06-05 | Discharge: 2018-06-05 | Disposition: A | Discharge status: Home / Self Care | Payer: Medicare Other | Source: Ambulatory Visit | Attending: Anesthesiology | Admitting: Anesthesiology

## 2018-06-05 ENCOUNTER — Ambulatory Visit (HOSPITAL_BASED_OUTPATIENT_CLINIC_OR_DEPARTMENT_OTHER): Payer: Medicare Other | Admitting: Anesthesiology

## 2018-06-05 VITALS — BP 109/57 | HR 77 | Temp 98.4°F | Resp 17 | Ht 64.0 in | Wt 170.0 lb

## 2018-06-05 DIAGNOSIS — M47816 Spondylosis without myelopathy or radiculopathy, lumbar region: Secondary | ICD-10-CM | POA: Diagnosis not present

## 2018-06-05 DIAGNOSIS — R52 Pain, unspecified: Secondary | ICD-10-CM | POA: Insufficient documentation

## 2018-06-05 DIAGNOSIS — M5136 Other intervertebral disc degeneration, lumbar region: Secondary | ICD-10-CM

## 2018-06-05 DIAGNOSIS — M5441 Lumbago with sciatica, right side: Secondary | ICD-10-CM

## 2018-06-05 DIAGNOSIS — G8929 Other chronic pain: Secondary | ICD-10-CM | POA: Insufficient documentation

## 2018-06-05 DIAGNOSIS — G894 Chronic pain syndrome: Secondary | ICD-10-CM | POA: Insufficient documentation

## 2018-06-05 DIAGNOSIS — Z79891 Long term (current) use of opiate analgesic: Secondary | ICD-10-CM | POA: Diagnosis not present

## 2018-06-05 MED ORDER — LIDOCAINE HCL (PF) 1 % IJ SOLN
5.0000 mL | Freq: Once | INTRAMUSCULAR | Status: AC
  Administered 2018-06-05: 5 mL via SUBCUTANEOUS
  Filled 2018-06-05: qty 5

## 2018-06-05 MED ORDER — ROPIVACAINE HCL 2 MG/ML IJ SOLN
10.0000 mL | Freq: Once | INTRAMUSCULAR | Status: AC
  Administered 2018-06-05: 10 mL via EPIDURAL
  Filled 2018-06-05: qty 10

## 2018-06-05 MED ORDER — DEXAMETHASONE SODIUM PHOSPHATE 10 MG/ML IJ SOLN
10.0000 mg | Freq: Once | INTRAMUSCULAR | Status: AC
  Administered 2018-06-05: 10 mg
  Filled 2018-06-05: qty 1

## 2018-06-05 MED ORDER — IOPAMIDOL (ISOVUE-M 200) INJECTION 41%
INTRAMUSCULAR | Status: AC
  Filled 2018-06-05: qty 10

## 2018-06-05 MED ORDER — SODIUM CHLORIDE 0.9% FLUSH
10.0000 mL | Freq: Once | INTRAVENOUS | Status: AC
  Administered 2018-06-05: 10 mL

## 2018-06-05 NOTE — Patient Instructions (Signed)

## 2018-06-05 NOTE — Progress Notes (Signed)
Safety precautions to be maintained throughout the outpatient stay will include: orient to surroundings, keep bed in low position, maintain call bell within reach at all times, provide assistance with transfer out of bed and ambulation.  

## 2018-06-05 NOTE — Progress Notes (Signed)
Subjective:  Patient ID: Megan Brock, female    DOB: 1980-08-10  Age: 38 y.o. MRN: [id number]  CC: Back Pain (lower)   Procedure: L5-S1 epidural steroid and fluoroscopic guidance without sedation  HPI [name] presents for reevaluation.  She was last seen several weeks ago and presents today for a scheduled epidural for her right lower extremity sciatica and low back pain.  Her last epidural back in August effectively eliminated her left side lower back pain and leg pain.  She is now having more symptoms on the right side she reports.  No change in bowel or bladder function is noted.  She has chronic diminished kidney function from her polycystic kidney disease.  This is followed by her urologist and nephrologist.  The medication she is taking are giving her good relief of her low back pain and chronic pain syndrome.  No changes are reported and based on her narcotic assessment sheet she is continuing to derive good functional lifestyle provement with those medicines.  Her primary complaint today is the right posterior lateral leg pain and calf cramping.  Outpatient Medications Prior to Visit  Medication Sig Dispense Refill  . amLODipine (NORVASC) 5 MG tablet TAKE 1 TABLET(5 MG) BY MOUTH DAILY 30 tablet 11  . cyclobenzaprine (FLEXERIL) 10 MG tablet Take 1 tablet (10 mg total) by mouth at bedtime. 30 tablet 1  . HYDROcodone-acetaminophen (NORCO) 10-325 MG tablet Take 1 tablet by mouth every 6 (six) hours as needed for up to 30 days for severe pain. 45 tablet 0  . [START ON 06/29/2018] HYDROcodone-acetaminophen (NORCO) 10-325 MG tablet Take 1 tablet by mouth every 6 (six) hours as needed for up to 30 days. 45 tablet 0  . lisinopril (PRINIVIL,ZESTRIL) 10 MG tablet TAKE 1 TABLET(10 MG) BY MOUTH DAILY 90 tablet 11  . [START ON 06/29/2018] morphine (MS CONTIN) 60 MG 12 hr tablet Take 1 tablet (60 mg total) by mouth 3 (three) times daily as needed for up to 30 days for pain. 75 tablet 0  .  Morphine Sulfate ER 60 MG T12A Take 1 tablet by mouth 3 (three) times daily as needed for up to 30 days. 75 tablet 0  . naloxone (NARCAN) nasal spray 4 mg/0.1 mL For excess sedation from opioids. 1 kit 2  . Tolvaptan (JYNARQUE) 90 & 30 MG TBPK Take 1 tablet by mouth 2 (two) times daily. Takes 60 mg in the AM and 30 mg 8 hours later.     . tolvaptan (JYNARQUE) 15 MG TABS tablet Take 15 mg by mouth 2 (two) times daily. 90 mg in am 30 mg in pm     No facility-administered medications prior to visit.     Review of Systems CNS: No confusion or sedation Cardiac: No angina or palpitations GI: No abdominal pain or constipation Constitutional: No nausea vomiting fevers or chills  Objective:  BP 132/70   Pulse (!) 113   Temp 98.4 F (36.9 C) (Oral)   Resp 16   Ht 5' 4"  (1.626 m)   Wt 170 lb (77.1 kg)   LMP 05/29/2018   SpO2 100%   BMI 29.18 kg/m    BP Readings from Last 3 Encounters:  06/05/18 132/70  05/29/18 (!) 148/98  04/21/18 (!) 126/58     Wt Readings from Last 3 Encounters:  06/05/18 170 lb (77.1 kg)  05/29/18 173 lb (78.5 kg)  04/21/18 173 lb 12.8 oz (78.8 kg)     Physical Exam Pt is  alert and oriented PERRL EOMI HEART IS RRR no murmur or rub LCTA no wheezing or rales MUSCULOSKELETAL reveals some right-sided flank tenderness but no overt trigger points.  She is walking with an antalgic gait and her cell tone and bulk is at baseline.  Labs  No results found for: HGBA1C Lab Results  Component Value Date   LDLCALC 105 (H) 07/17/2015   CREATININE 0.98 02/08/2017    -------------------------------------------------------------------------------------------------------------------- Lab Results  Component Value Date   WBC 9.2 02/08/2017   HGB 13.2 02/08/2017   HCT 37.7 02/08/2017   PLT 197 02/08/2017   GLUCOSE 87 02/08/2017   CHOL 174 07/17/2015   TRIG 139 07/17/2015   HDL 41 07/17/2015   LDLCALC 105 (H) 07/17/2015   ALT 10 02/08/2017   AST 12 02/08/2017    NA 140 02/08/2017   K 4.0 02/08/2017   CL 105 02/08/2017   CREATININE 0.98 02/08/2017   BUN 18 02/08/2017   CO2 29 02/08/2017   TSH 1.60 02/08/2017    --------------------------------------------------------------------------------------------------------------------- No results found.   Assessment & Plan:   Liesel was seen today for back pain.  Diagnoses and all orders for this visit:  Lumbar spondylosis  Chronic bilateral low back pain with right-sided sciatica  Chronic pain syndrome  Long term current use of opiate analgesic  DDD (degenerative disc disease), lumbar  Other orders -     sodium chloride flush (NS) 0.9 % injection 10 mL -     ropivacaine (PF) 2 mg/mL (0.2%) (NAROPIN) injection 10 mL -     lidocaine (PF) (XYLOCAINE) 1 % injection 5 mL -     dexamethasone (DECADRON) injection 10 mg        ----------------------------------------------------------------------------------------------------------------------  Problem List Items Addressed This Visit      Unprioritized   Chronic lower back pain   Relevant Medications   dexamethasone (DECADRON) injection 10 mg   Chronic pain syndrome (Chronic)   Long term current use of opiate analgesic (Chronic)   Lumbar spondylosis - Primary (Chronic)   Relevant Medications   dexamethasone (DECADRON) injection 10 mg    Other Visit Diagnoses    DDD (degenerative disc disease), lumbar       Relevant Medications   dexamethasone (DECADRON) injection 10 mg        ----------------------------------------------------------------------------------------------------------------------  1. Lumbar spondylosis We will proceed with a right side L5-S1 lumbar epidural steroid injection today as reviewed with the patient.  We have gone over the risks and benefits of the procedure with her in full detail and all questions are answered.  My return to clinic in 2 months for reevaluation and refill of her medications.  At this  point she is in good shape with her current medication prescription supply.  2. Chronic bilateral low back pain with right-sided sciatica As above and continue core stretching strengthening.  3. Chronic pain syndrome As above  4. Long term current use of opiate analgesic As above  5. DDD (degenerative disc disease), lumbar As above    ----------------------------------------------------------------------------------------------------------------------  I am having Exie Parody. Churilla maintain her naloxone, JYNARQUE, amLODipine, lisinopril, Tolvaptan, HYDROcodone-acetaminophen, Morphine Sulfate ER, morphine, HYDROcodone-acetaminophen, and cyclobenzaprine.   Meds ordered this encounter  Medications  . sodium chloride flush (NS) 0.9 % injection 10 mL  . ropivacaine (PF) 2 mg/mL (0.2%) (NAROPIN) injection 10 mL  . lidocaine (PF) (XYLOCAINE) 1 % injection 5 mL  . dexamethasone (DECADRON) injection 10 mg   Patient's Medications  New Prescriptions   No medications on file  Previous Medications   AMLODIPINE (NORVASC) 5 MG TABLET    TAKE 1 TABLET(5 MG) BY MOUTH DAILY   CYCLOBENZAPRINE (FLEXERIL) 10 MG TABLET    Take 1 tablet (10 mg total) by mouth at bedtime.   HYDROCODONE-ACETAMINOPHEN (NORCO) 10-325 MG TABLET    Take 1 tablet by mouth every 6 (six) hours as needed for up to 30 days for severe pain.   HYDROCODONE-ACETAMINOPHEN (NORCO) 10-325 MG TABLET    Take 1 tablet by mouth every 6 (six) hours as needed for up to 30 days.   LISINOPRIL (PRINIVIL,ZESTRIL) 10 MG TABLET    TAKE 1 TABLET(10 MG) BY MOUTH DAILY   MORPHINE (MS CONTIN) 60 MG 12 HR TABLET    Take 1 tablet (60 mg total) by mouth 3 (three) times daily as needed for up to 30 days for pain.   MORPHINE SULFATE ER 60 MG T12A    Take 1 tablet by mouth 3 (three) times daily as needed for up to 30 days.   NALOXONE (NARCAN) NASAL SPRAY 4 MG/0.1 ML    For excess sedation from opioids.   TOLVAPTAN (JYNARQUE) 15 MG TABS TABLET    Take 15 mg  by mouth 2 (two) times daily. 90 mg in am 30 mg in pm   TOLVAPTAN (JYNARQUE) 90 & 30 MG TBPK    Take 1 tablet by mouth 2 (two) times daily. Takes 60 mg in the AM and 30 mg 8 hours later.   Modified Medications   No medications on file  Discontinued Medications   No medications on file   ----------------------------------------------------------------------------------------------------------------------  Follow-up: Return for evaluation, med refill.   Procedure: L5-S1 LESI with fluoroscopic guidance and no moderate sedation  NOTE: The risks, benefits, and expectations of the procedure have been discussed and explained to the patient who was understanding and in agreement with suggested treatment plan. No guarantees were made.  DESCRIPTION OF PROCEDURE: Lumbar epidural steroid injection with no IV Versed, EKG, blood pressure, pulse, and pulse oximetry monitoring. The procedure was performed with the patient in the prone position under fluoroscopic guidance.  Sterile prep x3 was initiated and I then injected subcutaneous lidocaine to the overlying L5-S1 site after its fluoroscopic identifictation.  Using strict aseptic technique, I then advanced an 18-gauge Tuohy epidural needle in the midline using interlaminar approach via loss-of-resistance to saline technique. There was negative aspiration for heme or  CSF.  I then confirmed position with both AP and Lateral fluoroscan.  0 cc of Isovue were injected and a  total of 5 mL of Preservative-Free normal saline mixed with 10 mg Decadron and 1cc Ropicaine 0.2 percent were injected incrementally via the  epidurally placed needle. The needle was removed. The patient tolerated the injection well and was convalesced and discharged to home in stable condition. Should the patient have any post procedure difficulty they have been instructed on how to contact us for assistance.    Molli Barrows, MD

## 2018-06-06 ENCOUNTER — Telehealth: Payer: Self-pay

## 2018-06-06 NOTE — Telephone Encounter (Signed)
Post procedure phone call.  Patiient states she is doing good.

## 2018-06-19 DIAGNOSIS — F339 Major depressive disorder, recurrent, unspecified: Secondary | ICD-10-CM | POA: Diagnosis not present

## 2018-06-22 DIAGNOSIS — I10 Essential (primary) hypertension: Secondary | ICD-10-CM | POA: Diagnosis not present

## 2018-06-22 DIAGNOSIS — N200 Calculus of kidney: Secondary | ICD-10-CM | POA: Diagnosis not present

## 2018-06-22 DIAGNOSIS — Q612 Polycystic kidney, adult type: Secondary | ICD-10-CM | POA: Diagnosis not present

## 2018-07-06 ENCOUNTER — Ambulatory Visit: Payer: Medicare Other | Attending: Anesthesiology | Admitting: Anesthesiology

## 2018-07-06 ENCOUNTER — Other Ambulatory Visit: Payer: Self-pay

## 2018-07-06 ENCOUNTER — Encounter: Payer: Self-pay | Admitting: Anesthesiology

## 2018-07-06 VITALS — BP 127/77 | HR 110 | Temp 98.6°F | Resp 18 | Ht 64.0 in | Wt 170.0 lb

## 2018-07-06 DIAGNOSIS — M5442 Lumbago with sciatica, left side: Secondary | ICD-10-CM

## 2018-07-06 DIAGNOSIS — M5136 Other intervertebral disc degeneration, lumbar region: Secondary | ICD-10-CM | POA: Diagnosis present

## 2018-07-06 DIAGNOSIS — G894 Chronic pain syndrome: Secondary | ICD-10-CM | POA: Diagnosis present

## 2018-07-06 DIAGNOSIS — G8929 Other chronic pain: Secondary | ICD-10-CM | POA: Diagnosis present

## 2018-07-06 DIAGNOSIS — M47816 Spondylosis without myelopathy or radiculopathy, lumbar region: Secondary | ICD-10-CM | POA: Diagnosis not present

## 2018-07-06 DIAGNOSIS — F1190 Opioid use, unspecified, uncomplicated: Secondary | ICD-10-CM | POA: Diagnosis present

## 2018-07-06 DIAGNOSIS — Z79891 Long term (current) use of opiate analgesic: Secondary | ICD-10-CM | POA: Diagnosis not present

## 2018-07-06 DIAGNOSIS — Q613 Polycystic kidney, unspecified: Secondary | ICD-10-CM | POA: Diagnosis present

## 2018-07-06 DIAGNOSIS — M5441 Lumbago with sciatica, right side: Secondary | ICD-10-CM

## 2018-07-06 MED ORDER — HYDROCODONE-ACETAMINOPHEN 10-325 MG PO TABS: 1.0000 | ORAL_TABLET | Freq: Four times a day (QID) | ORAL | 0 refills | Status: DC | PRN

## 2018-07-06 MED ORDER — HYDROCODONE-ACETAMINOPHEN 10-325 MG PO TABS: 1.0000 | ORAL_TABLET | Freq: Four times a day (QID) | ORAL | 0 refills | Status: AC | PRN

## 2018-07-06 MED ORDER — MORPHINE SULFATE ER 60 MG PO TBCR: 60.0000 mg | EXTENDED_RELEASE_TABLET | Freq: Three times a day (TID) | ORAL | 0 refills | Status: DC

## 2018-07-06 NOTE — Progress Notes (Signed)
Nursing Pain Medication Assessment:  Safety precautions to be maintained throughout the outpatient stay will include: orient to surroundings, keep bed in low position, maintain call bell within reach at all times, provide assistance with transfer out of bed and ambulation.  Medication Inspection Compliance: Pill count conducted under aseptic conditions, in front of the patient. Neither the pills nor the bottle was removed from the patient's sight at any time. Once count was completed pills were immediately returned to the patient in their original bottle.  Medication #1: Hydrocodone/APAP Pill/Patch Count: 29 of 45 pills remain Pill/Patch Appearance: Markings consistent with prescribed medication Bottle Appearance: Standard pharmacy container. Clearly labeled. Filled Date: 03/ 10 / 2020 Last Medication intake:  Today  Medication #2: Morphine ER (MSContin) Pill/Patch Count: 58 of 75 pills remain Pill/Patch Appearance: Markings consistent with prescribed medication Bottle Appearance: Standard pharmacy container. Clearly labeled. Filled Date: 03 / 10 / 2020 Last Medication intake:  Today

## 2018-07-06 NOTE — Progress Notes (Signed)
Subjective:  Patient ID: Megan Brock, female    DOB: 09-18-1980  Age: 38 y.o. MRN: [id number]  CC: Medication Refill and Back Pain   Procedure: None  HPI Megan Brock presents for reevaluation.  She was last seen proximately a month ago and had an epidural steroid injection at that time.  It took about 5 or 6 days to give her relief but her right lower extremity pain has essentially resolved completely.  She gets occasional fleeting calf cramping but nothing as severe as before.  In regards to her chronic flank pain, this persists but is generally controlled with her medication management.  She is been on this protocol for opioid management of her polycystic kidney disease for a long time and this is worked well for her.  She denies any diverting or illicit use.  I have reviewed her narcotic assessment sheet and she continues to derive good functional lifestyle improvement with her medications.  Outpatient Medications Prior to Visit  Medication Sig Dispense Refill  . amLODipine (NORVASC) 5 MG tablet TAKE 1 TABLET(5 MG) BY MOUTH DAILY 30 tablet 11  . cyclobenzaprine (FLEXERIL) 10 MG tablet Take 1 tablet (10 mg total) by mouth at bedtime. 30 tablet 1  . lisinopril (PRINIVIL,ZESTRIL) 10 MG tablet TAKE 1 TABLET(10 MG) BY MOUTH DAILY 90 tablet 11  . naloxone (NARCAN) nasal spray 4 mg/0.1 mL For excess sedation from opioids. 1 kit 2  . Tolvaptan (JYNARQUE) 90 & 30 MG TBPK Take 1 tablet by mouth 2 (two) times daily. Takes 60 mg in the AM and 30 mg 8 hours later.     Marland Kitchen HYDROcodone-acetaminophen (NORCO) 10-325 MG tablet Take 1 tablet by mouth every 6 (six) hours as needed for up to 30 days. 45 tablet 0  . morphine (MS CONTIN) 60 MG 12 hr tablet Take 1 tablet (60 mg total) by mouth 3 (three) times daily as needed for up to 30 days for pain. 75 tablet 0  . tolvaptan (JYNARQUE) 15 MG TABS tablet Take 15 mg by mouth 2 (two) times daily. 90 mg in am 30 mg in pm     No facility-administered  medications prior to visit.     Review of Systems CNS: No confusion or sedation Cardiac: No angina or palpitations GI: No abdominal pain or constipation Constitutional: No nausea vomiting fevers or chills  Objective:  BP 127/77   Pulse (!) 110   Temp 98.6 F (37 C)   Resp 18   Ht 5' 4"  (1.626 m)   Wt 170 lb (77.1 kg)   SpO2 100%   BMI 29.18 kg/m    BP Readings from Last 3 Encounters:  07/06/18 127/77  06/05/18 (!) 109/57  05/29/18 (!) 148/98     Wt Readings from Last 3 Encounters:  07/06/18 170 lb (77.1 kg)  06/05/18 170 lb (77.1 kg)  05/29/18 173 lb (78.5 kg)     Physical Exam Pt is alert and oriented PERRL EOMI HEART IS RRR no murmur or rub LCTA no wheezing or rales MUSCULOSKELETAL deferred  Labs  No results found for: HGBA1C Lab Results  Component Value Date   LDLCALC 105 (H) 07/17/2015   CREATININE 0.98 02/08/2017    -------------------------------------------------------------------------------------------------------------------- Lab Results  Component Value Date   WBC 9.2 02/08/2017   HGB 13.2 02/08/2017   HCT 37.7 02/08/2017   PLT 197 02/08/2017   GLUCOSE 87 02/08/2017   CHOL 174 07/17/2015   TRIG 139 07/17/2015   HDL 41 07/17/2015  LDLCALC 105 (H) 07/17/2015   ALT 10 02/08/2017   AST 12 02/08/2017   NA 140 02/08/2017   K 4.0 02/08/2017   CL 105 02/08/2017   CREATININE 0.98 02/08/2017   BUN 18 02/08/2017   CO2 29 02/08/2017   TSH 1.60 02/08/2017    --------------------------------------------------------------------------------------------------------------------- Dg C-arm 1-60 Min-no Report  Result Date: 06/05/2018 Fluoroscopy was utilized by the requesting physician.  No radiographic interpretation.     Assessment & Plan:   Megan Brock was seen today for medication refill and back pain.  Diagnoses and all orders for this visit:  Lumbar spondylosis  Chronic bilateral low back pain with right-sided sciatica  Chronic pain  syndrome  Long term current use of opiate analgesic  DDD (degenerative disc disease), lumbar  Chronic bilateral low back pain with left-sided sciatica  Polycystic kidney disease  Chronic, continuous use of opioids  Other orders -     HYDROcodone-acetaminophen (NORCO) 10-325 MG tablet; Take 1 tablet by mouth every 6 (six) hours as needed for up to 30 days for moderate pain or severe pain. -     HYDROcodone-acetaminophen (NORCO) 10-325 MG tablet; Take 1 tablet by mouth every 6 (six) hours as needed for up to 30 days for moderate pain or severe pain. -     morphine (MS CONTIN) 60 MG 12 hr tablet; Take 1 tablet (60 mg total) by mouth 3 (three) times daily for 30 days. -     morphine (MS CONTIN) 60 MG 12 hr tablet; Take 1 tablet (60 mg total) by mouth 3 (three) times daily for 30 days.        ----------------------------------------------------------------------------------------------------------------------  Problem List Items Addressed This Visit      Unprioritized   Chronic lower back pain   Relevant Medications   HYDROcodone-acetaminophen (NORCO) 10-325 MG tablet (Start on 07/29/2018)   HYDROcodone-acetaminophen (NORCO) 10-325 MG tablet (Start on 08/28/2018)   morphine (MS CONTIN) 60 MG 12 hr tablet (Start on 07/29/2018)   morphine (MS CONTIN) 60 MG 12 hr tablet (Start on 08/28/2018)   Chronic pain syndrome (Chronic)   Chronic, continuous use of opioids   Long term current use of opiate analgesic (Chronic)   Lumbar spondylosis - Primary (Chronic)   Relevant Medications   HYDROcodone-acetaminophen (NORCO) 10-325 MG tablet (Start on 07/29/2018)   HYDROcodone-acetaminophen (NORCO) 10-325 MG tablet (Start on 08/28/2018)   morphine (MS CONTIN) 60 MG 12 hr tablet (Start on 07/29/2018)   morphine (MS CONTIN) 60 MG 12 hr tablet (Start on 08/28/2018)    Other Visit Diagnoses    DDD (degenerative disc disease), lumbar       Relevant Medications   HYDROcodone-acetaminophen (NORCO) 10-325  MG tablet (Start on 07/29/2018)   HYDROcodone-acetaminophen (NORCO) 10-325 MG tablet (Start on 08/28/2018)   morphine (MS CONTIN) 60 MG 12 hr tablet (Start on 07/29/2018)   morphine (MS CONTIN) 60 MG 12 hr tablet (Start on 08/28/2018)   Polycystic kidney disease            ----------------------------------------------------------------------------------------------------------------------  1. Lumbar spondylosis Continue with stretching strengthening exercises and aerobic conditioning.  We will have her return to clinic in approximately 2 months for reevaluation.  I will defer on any repeat injections at this time.  2. Chronic bilateral low back pain with right-sided sciatica As above  3. Chronic pain syndrome Continue current medication management with refills given for March 11 and April 11 for both medications.  I have reviewed the [facility] practitioner database information and it is appropriate.  She is scheduled for return in 2 months and continue follow-up with her primary care physicians.  4. Long term current use of opiate analgesic As above  5. DDD (degenerative disc disease), lumbar   6. Chronic bilateral low back pain with left-sided sciatica   7. Polycystic kidney disease Continue follow-up with nephrology  8. Chronic, continuous use of opioids     ----------------------------------------------------------------------------------------------------------------------  I have discontinued Megan Brock. Megan Brock's morphine and HYDROcodone-acetaminophen. I am also having her start on HYDROcodone-acetaminophen, HYDROcodone-acetaminophen, morphine, and morphine. Additionally, I am having her maintain her naloxone, Jynarque, amLODipine, lisinopril, Tolvaptan, and cyclobenzaprine.   Meds ordered this encounter  Medications  . HYDROcodone-acetaminophen (NORCO) 10-325 MG tablet    Sig: Take 1 tablet by mouth every 6 (six) hours as needed for up to 30 days for moderate pain  or severe pain.    Dispense:  45 tablet    Refill:  0  . HYDROcodone-acetaminophen (NORCO) 10-325 MG tablet    Sig: Take 1 tablet by mouth every 6 (six) hours as needed for up to 30 days for moderate pain or severe pain.    Dispense:  45 tablet    Refill:  0    30 day supply  . morphine (MS CONTIN) 60 MG 12 hr tablet    Sig: Take 1 tablet (60 mg total) by mouth 3 (three) times daily for 30 days.    Dispense:  75 tablet    Refill:  0    30 day supply  . morphine (MS CONTIN) 60 MG 12 hr tablet    Sig: Take 1 tablet (60 mg total) by mouth 3 (three) times daily for 30 days.    Dispense:  75 tablet    Refill:  0    30 day supply   Patient's Medications  New Prescriptions   HYDROCODONE-ACETAMINOPHEN (NORCO) 10-325 MG TABLET    Take 1 tablet by mouth every 6 (six) hours as needed for up to 30 days for moderate pain or severe pain.   HYDROCODONE-ACETAMINOPHEN (NORCO) 10-325 MG TABLET    Take 1 tablet by mouth every 6 (six) hours as needed for up to 30 days for moderate pain or severe pain.   MORPHINE (MS CONTIN) 60 MG 12 HR TABLET    Take 1 tablet (60 mg total) by mouth 3 (three) times daily for 30 days.   MORPHINE (MS CONTIN) 60 MG 12 HR TABLET    Take 1 tablet (60 mg total) by mouth 3 (three) times daily for 30 days.  Previous Medications   AMLODIPINE (NORVASC) 5 MG TABLET    TAKE 1 TABLET(5 MG) BY MOUTH DAILY   CYCLOBENZAPRINE (FLEXERIL) 10 MG TABLET    Take 1 tablet (10 mg total) by mouth at bedtime.   LISINOPRIL (PRINIVIL,ZESTRIL) 10 MG TABLET    TAKE 1 TABLET(10 MG) BY MOUTH DAILY   NALOXONE (NARCAN) NASAL SPRAY 4 MG/0.1 ML    For excess sedation from opioids.   TOLVAPTAN (JYNARQUE) 15 MG TABS TABLET    Take 15 mg by mouth 2 (two) times daily. 90 mg in am 30 mg in pm   TOLVAPTAN (JYNARQUE) 90 & 30 MG TBPK    Take 1 tablet by mouth 2 (two) times daily. Takes 60 mg in the AM and 30 mg 8 hours later.   Modified Medications   No medications on file  Discontinued Medications    HYDROCODONE-ACETAMINOPHEN (NORCO) 10-325 MG TABLET    Take 1 tablet by mouth every 6 (six)  hours as needed for up to 30 days.   MORPHINE (MS CONTIN) 60 MG 12 HR TABLET    Take 1 tablet (60 mg total) by mouth 3 (three) times daily as needed for up to 30 days for pain.   ----------------------------------------------------------------------------------------------------------------------  Follow-up: Return in about 2 months (around 09/05/2018) for evaluation, med refill.    Molli Barrows, MD

## 2018-08-24 DIAGNOSIS — R809 Proteinuria, unspecified: Secondary | ICD-10-CM | POA: Diagnosis not present

## 2018-08-24 DIAGNOSIS — Q612 Polycystic kidney, adult type: Secondary | ICD-10-CM | POA: Diagnosis not present

## 2018-08-24 DIAGNOSIS — I10 Essential (primary) hypertension: Secondary | ICD-10-CM | POA: Diagnosis not present

## 2018-08-24 DIAGNOSIS — N182 Chronic kidney disease, stage 2 (mild): Secondary | ICD-10-CM | POA: Diagnosis not present

## 2018-08-24 DIAGNOSIS — N200 Calculus of kidney: Secondary | ICD-10-CM | POA: Diagnosis not present

## 2018-09-11 DIAGNOSIS — F339 Major depressive disorder, recurrent, unspecified: Secondary | ICD-10-CM | POA: Diagnosis not present

## 2018-09-12 ENCOUNTER — Encounter: Payer: Self-pay | Admitting: Anesthesiology

## 2018-09-12 ENCOUNTER — Ambulatory Visit: Payer: Medicare Other | Attending: Anesthesiology | Admitting: Anesthesiology

## 2018-09-12 ENCOUNTER — Other Ambulatory Visit: Payer: Self-pay

## 2018-09-12 DIAGNOSIS — M47816 Spondylosis without myelopathy or radiculopathy, lumbar region: Secondary | ICD-10-CM | POA: Diagnosis not present

## 2018-09-12 DIAGNOSIS — G8929 Other chronic pain: Secondary | ICD-10-CM

## 2018-09-12 DIAGNOSIS — Q613 Polycystic kidney, unspecified: Secondary | ICD-10-CM

## 2018-09-12 DIAGNOSIS — M5441 Lumbago with sciatica, right side: Secondary | ICD-10-CM | POA: Diagnosis not present

## 2018-09-12 DIAGNOSIS — G894 Chronic pain syndrome: Secondary | ICD-10-CM | POA: Diagnosis not present

## 2018-09-12 DIAGNOSIS — M5442 Lumbago with sciatica, left side: Secondary | ICD-10-CM

## 2018-09-12 DIAGNOSIS — M5136 Other intervertebral disc degeneration, lumbar region: Secondary | ICD-10-CM

## 2018-09-12 DIAGNOSIS — Z79891 Long term (current) use of opiate analgesic: Secondary | ICD-10-CM

## 2018-09-12 DIAGNOSIS — F1190 Opioid use, unspecified, uncomplicated: Secondary | ICD-10-CM

## 2018-09-12 MED ORDER — HYDROCODONE-ACETAMINOPHEN 10-325 MG PO TABS: 1.0000 | ORAL_TABLET | Freq: Four times a day (QID) | ORAL | 0 refills | Status: DC | PRN

## 2018-09-12 MED ORDER — CYCLOBENZAPRINE HCL 10 MG PO TABS: 10.0000 mg | ORAL_TABLET | Freq: Every day | ORAL | 3 refills | Status: AC

## 2018-09-12 MED ORDER — MORPHINE SULFATE ER 60 MG PO TBCR: 60.0000 mg | EXTENDED_RELEASE_TABLET | Freq: Three times a day (TID) | ORAL | 0 refills | Status: DC

## 2018-09-12 MED ORDER — HYDROCODONE-ACETAMINOPHEN 10-325 MG PO TABS: 1.0000 | ORAL_TABLET | Freq: Four times a day (QID) | ORAL | 0 refills | Status: AC | PRN

## 2018-09-12 NOTE — Progress Notes (Signed)
Virtual Visit via Telephone Note  I connected with Megan Brock on 09/12/18 at 10:15 AM EDT by telephone and verified that I am speaking with the correct person using two identifiers.  Location: Patient: Home Provider: Pain control center   I discussed the limitations, risks, security and privacy concerns of performing an evaluation and management service by telephone and the availability of in person appointments. I also discussed with the patient that there may be a patient responsible charge related to this service. The patient expressed understanding and agreed to proceed.   History of Present Illness: I spoke with Megan Brock via IT trainer.  She states that her back pain responded favorably to her last epidural but she is getting some more right-sided lower back pain with radiation into the right calf and foot.  This is not uncommon for her.  She is had this in the past and getting good relief with the epidurals.  Generally she reports 75 to 80% relief lasting a few months.  Her last injection was back in February.  Otherwise she is also noted some right lower extremity swelling.  Some mild erythema over the medial aspect of the left knee.  No changes in strength or bowel or bladder function is noted.  She is been taking her medications and continues to get good relief from these.    Observations/Objective: Video review of the right knee revealed minimal erythema and no significant edema for video perspective.  Current Outpatient Medications:  .  amLODipine (NORVASC) 5 MG tablet, TAKE 1 TABLET(5 MG) BY MOUTH DAILY, Disp: 30 tablet, Rfl: 11 .  cyclobenzaprine (FLEXERIL) 10 MG tablet, Take 1 tablet (10 mg total) by mouth at bedtime for 30 days., Disp: 30 tablet, Rfl: 3 .  [START ON 09/26/2018] HYDROcodone-acetaminophen (NORCO) 10-325 MG tablet, Take 1 tablet by mouth every 6 (six) hours as needed for up to 30 days for moderate pain or severe pain., Disp: 45 tablet, Rfl: 0 .   [START ON 10/26/2018] HYDROcodone-acetaminophen (NORCO) 10-325 MG tablet, Take 1 tablet by mouth every 6 (six) hours as needed for up to 30 days for moderate pain or severe pain., Disp: 45 tablet, Rfl: 0 .  lisinopril (PRINIVIL,ZESTRIL) 10 MG tablet, TAKE 1 TABLET(10 MG) BY MOUTH DAILY, Disp: 90 tablet, Rfl: 11 .  [START ON 09/26/2018] morphine (MS CONTIN) 60 MG 12 hr tablet, Take 1 tablet (60 mg total) by mouth 3 (three) times daily for 30 days., Disp: 75 tablet, Rfl: 0 .  morphine (MS CONTIN) 60 MG 12 hr tablet, Take 1 tablet (60 mg total) by mouth 3 (three) times daily for 30 days., Disp: 75 tablet, Rfl: 0 .  naloxone (NARCAN) nasal spray 4 mg/0.1 mL, For excess sedation from opioids., Disp: 1 kit, Rfl: 2 .  tolvaptan (JYNARQUE) 15 MG TABS tablet, Take 15 mg by mouth 2 (two) times daily. 90 mg in am 30 mg in pm, Disp: , Rfl:  .  Tolvaptan (JYNARQUE) 90 & 30 MG TBPK, Take 1 tablet by mouth 2 (two) times daily. Takes 60 mg in the AM and 30 mg 8 hours later. , Disp: , Rfl:    Assessment and Plan: 1. Lumbar spondylosis   2. Chronic bilateral low back pain with right-sided sciatica   3. Chronic pain syndrome   4. Long term current use of opiate analgesic   5. DDD (degenerative disc disease), lumbar   6. Chronic bilateral low back pain with left-sided sciatica   7. Polycystic kidney disease  8. Chronic, continuous use of opioids   Based on our discussion today I am going to refill her medications and after review of the [facility] practitioner database information, we will refill for June 9 and July 9 for both the hydrocodone 10 mg tablets and the MS Contin.  I am scheduling her for return to clinic in 2 months with an epidural at that time.  In the meantime per discussion today, I do not feel the swelling is secondary to sciatica and have requested that she follow-up with her nephrologist for further evaluation and consideration.  Should she have any further questions regarding her pain management,  and considering the COVID crisis, she is instructed to contact us at the pain control center.  She is scheduled for a 78-monthreturn.  Follow Up Instructions:    I discussed the assessment and treatment plan with the patient. The patient was provided an opportunity to ask questions and all were answered. The patient agreed with the plan and demonstrated an understanding of the instructions.   The patient was advised to call back or seek an in-person evaluation if the symptoms worsen or if the condition fails to improve as anticipated.  I provided 30 minutes of non-face-to-face time during this encounter.   JMolli Barrows MD

## 2018-09-20 ENCOUNTER — Other Ambulatory Visit: Payer: Self-pay | Admitting: Family Medicine

## 2018-09-20 DIAGNOSIS — Z720 Tobacco use: Secondary | ICD-10-CM

## 2018-10-24 DIAGNOSIS — N200 Calculus of kidney: Secondary | ICD-10-CM | POA: Diagnosis not present

## 2018-10-24 DIAGNOSIS — I10 Essential (primary) hypertension: Secondary | ICD-10-CM | POA: Diagnosis not present

## 2018-10-24 DIAGNOSIS — Q612 Polycystic kidney, adult type: Secondary | ICD-10-CM | POA: Diagnosis not present

## 2018-10-27 DIAGNOSIS — I10 Essential (primary) hypertension: Secondary | ICD-10-CM | POA: Diagnosis not present

## 2018-10-27 DIAGNOSIS — Q612 Polycystic kidney, adult type: Secondary | ICD-10-CM | POA: Diagnosis not present

## 2018-10-27 DIAGNOSIS — R809 Proteinuria, unspecified: Secondary | ICD-10-CM | POA: Diagnosis not present

## 2018-11-22 ENCOUNTER — Ambulatory Visit
Admission: RE | Admit: 2018-11-22 | Discharge: 2018-11-22 | Disposition: A | Discharge status: Home / Self Care | Payer: Medicare Other | Source: Ambulatory Visit | Attending: Urology | Admitting: Urology

## 2018-11-22 ENCOUNTER — Encounter: Payer: Self-pay | Admitting: Anesthesiology

## 2018-11-22 ENCOUNTER — Encounter: Payer: Self-pay | Admitting: Urology

## 2018-11-22 ENCOUNTER — Ambulatory Visit (INDEPENDENT_AMBULATORY_CARE_PROVIDER_SITE_OTHER): Payer: Medicare Other | Admitting: Urology

## 2018-11-22 ENCOUNTER — Other Ambulatory Visit: Payer: Self-pay

## 2018-11-22 ENCOUNTER — Ambulatory Visit (HOSPITAL_BASED_OUTPATIENT_CLINIC_OR_DEPARTMENT_OTHER): Payer: Medicare Other | Admitting: Anesthesiology

## 2018-11-22 VITALS — BP 132/68 | HR 72 | Ht 64.0 in | Wt 174.0 lb

## 2018-11-22 DIAGNOSIS — M5136 Other intervertebral disc degeneration, lumbar region: Secondary | ICD-10-CM | POA: Insufficient documentation

## 2018-11-22 DIAGNOSIS — M5442 Lumbago with sciatica, left side: Secondary | ICD-10-CM | POA: Insufficient documentation

## 2018-11-22 DIAGNOSIS — M47816 Spondylosis without myelopathy or radiculopathy, lumbar region: Secondary | ICD-10-CM

## 2018-11-22 DIAGNOSIS — G8929 Other chronic pain: Secondary | ICD-10-CM

## 2018-11-22 DIAGNOSIS — Q613 Polycystic kidney, unspecified: Secondary | ICD-10-CM

## 2018-11-22 DIAGNOSIS — Z79891 Long term (current) use of opiate analgesic: Secondary | ICD-10-CM | POA: Diagnosis not present

## 2018-11-22 DIAGNOSIS — G894 Chronic pain syndrome: Secondary | ICD-10-CM

## 2018-11-22 DIAGNOSIS — R109 Unspecified abdominal pain: Secondary | ICD-10-CM | POA: Diagnosis not present

## 2018-11-22 DIAGNOSIS — N200 Calculus of kidney: Secondary | ICD-10-CM

## 2018-11-22 DIAGNOSIS — M5441 Lumbago with sciatica, right side: Secondary | ICD-10-CM | POA: Insufficient documentation

## 2018-11-22 DIAGNOSIS — M51369 Other intervertebral disc degeneration, lumbar region without mention of lumbar back pain or lower extremity pain: Secondary | ICD-10-CM

## 2018-11-22 MED ORDER — HYDROCODONE-ACETAMINOPHEN 10-325 MG PO TABS: 1.0000 | ORAL_TABLET | Freq: Four times a day (QID) | ORAL | 0 refills | Status: AC | PRN

## 2018-11-22 MED ORDER — HYDROCODONE-ACETAMINOPHEN 10-325 MG PO TABS: 1.0000 | ORAL_TABLET | Freq: Four times a day (QID) | ORAL | 0 refills | Status: DC | PRN

## 2018-11-22 MED ORDER — MORPHINE SULFATE ER 60 MG PO TBCR: 60.0000 mg | EXTENDED_RELEASE_TABLET | Freq: Three times a day (TID) | ORAL | 0 refills | Status: DC

## 2018-11-22 NOTE — Progress Notes (Signed)
Virtual Visit via Video Note  I connected with Megan Brock on 11/22/18 at  3:00 PM EDT by a video enabled telemedicine application and verified that I am speaking with the correct person using two identifiers.  Location: Patient: Home Provider: Pain control center   I discussed the limitations of evaluation and management by telemedicine and the availability of in person appointments. The patient expressed understanding and agreed to proceed.  History of Present Illness: I spoke with Megan Brock today regarding her low back pain and flank pain and lower extremity pain.  She is done very well following her epidural steroids in the past and this is resolved most of her left lower extremity pain however she is continued to have radicular pain affecting the right leg and right calf.  She has been seen by Dr. Erlene Quan and Dr. Zollie Scale regarding her flank pain and does have several kidney stones.  She generally treats this pain successfully with the hydrocodone 10 mg tablets and MS Contin medications.  She takes them as prescribed and these continue to work well for her.  She denies any diverting or illicit use and is getting good relief from them with no side effects mentioned.  Otherwise she is in her usual state of health and is scheduled for an epidural steroid within the next 3 weeks..    Observations/Objective: Current Outpatient Medications:  .  amLODipine (NORVASC) 5 MG tablet, TAKE 1 TABLET(5 MG) BY MOUTH DAILY, Disp: 30 tablet, Rfl: 11 .  cyclobenzaprine (FLEXERIL) 10 MG tablet, TK 1 T PO HS, Disp: , Rfl:  .  [START ON 11/25/2018] HYDROcodone-acetaminophen (NORCO) 10-325 MG tablet, Take 1 tablet by mouth every 6 (six) hours as needed for moderate pain or severe pain., Disp: 45 tablet, Rfl: 0 .  [START ON 12/25/2018] HYDROcodone-acetaminophen (NORCO) 10-325 MG tablet, Take 1 tablet by mouth every 6 (six) hours as needed for moderate pain or severe pain., Disp: 45 tablet, Rfl: 0 .  lisinopril  (PRINIVIL,ZESTRIL) 10 MG tablet, TAKE 1 TABLET(10 MG) BY MOUTH DAILY, Disp: 90 tablet, Rfl: 11 .  [START ON 11/25/2018] morphine (MS CONTIN) 60 MG 12 hr tablet, Take 1 tablet (60 mg total) by mouth 3 (three) times daily., Disp: 75 tablet, Rfl: 0 .  [START ON 12/25/2018] morphine (MS CONTIN) 60 MG 12 hr tablet, Take 1 tablet (60 mg total) by mouth 3 (three) times daily., Disp: 75 tablet, Rfl: 0 .  naloxone (NARCAN) nasal spray 4 mg/0.1 mL, For excess sedation from opioids., Disp: 1 kit, Rfl: 2 .  Tolvaptan (JYNARQUE) 90 & 30 MG TBPK, Take 1 tablet by mouth 2 (two) times daily. Takes 60 mg in the AM and 30 mg 8 hours later. , Disp: , Rfl:    Assessment and Plan: 1. Lumbar spondylosis   2. Chronic bilateral low back pain with right-sided sciatica   3. Chronic pain syndrome   4. Long term current use of opiate analgesic   5. DDD (degenerative disc disease), lumbar   6. Chronic bilateral low back pain with left-sided sciatica   7. Polycystic kidney disease   Based on our discussion today and upon review of the [place] practitioner database information I am going to refill her medications for August 8 and September 7 with a schedule return to clinic for medication refill in 2 months.  She is currently scheduled for an epidural in approximately the next 3 weeks.  We have gone over the risks and benefits of the procedure with her in  full detail and her questions been answered.  I want her to continue with exercise stretching and strengthening and continue follow-up with Dr. Erlene Quan and Dr. Zollie Scale.  Follow Up Instructions:    I discussed the assessment and treatment plan with the patient. The patient was provided an opportunity to ask questions and all were answered. The patient agreed with the plan and demonstrated an understanding of the instructions.   The patient was advised to call back or seek an in-person evaluation if the symptoms worsen or if the condition fails to improve as  anticipated.  I provided 30 minutes of non-face-to-face time during this encounter.   Molli Barrows, MD

## 2018-11-22 NOTE — Progress Notes (Signed)
11/22/2018 12:30 PM   Megan Brock 1980/08/02 [id number]  Referring provider: Birdie Sons, [address] [place],  [place] [address]  Chief Complaint  Patient presents with  . Follow-up    HPI: 38 year old female with a personal history of autosomal dominant polycystic kidney disease and kidney stones who returns today for routine annual follow-up with KUB.  In the interim, she is now established patient of Dr. Holley Raring.  She has been placed on a new medication, tolvaptan, which is a new medication under the investigation for polycystic kidney disease.  Her blood pressures been under good control and his urinary protein is decreased.  She feels like she is in good hands with Dr. Holley Raring.  Since last year, she has had an renal MRI (noncontrast) to serve as baseline.  She said no kidney stone episodes.  KUB today shows stable stone burden, up to 4 mm bilaterally.  She does also have a history of spontaneous bleeding of cyst.  She has not had a recent episode.  She does have chronic back pain including sciatica.  She is followed by Dr. Andree Elk.  Her pain is persistent but stable.  No urinary symptoms.   PMH: Past Medical History:  Diagnosis Date  . Hematuria   . History of chicken pox   . Hypertension   . Nephrolithiasis   . Polycystic kidney disease     Surgical History: Past Surgical History:  Procedure Laterality Date  . CESAREAN SECTION    . LITHOTRIPSY     has had 3 lithotripsy  . TUBAL LIGATION      Home Medications:  Allergies as of 11/22/2018   No Known Allergies     Medication List       Accurate as of November 22, 2018 12:30 PM. If you have any questions, ask your nurse or doctor.        STOP taking these medications   buPROPion 150 MG 12 hr tablet Commonly known as: WELLBUTRIN SR Stopped by: Hollice Espy, MD     TAKE these medications   amLODipine 5 MG tablet Commonly known as: NORVASC TAKE 1 TABLET(5 MG) BY MOUTH DAILY    cyclobenzaprine 10 MG tablet Commonly known as: FLEXERIL TK 1 T PO HS   HYDROcodone-acetaminophen 10-325 MG tablet Commonly known as: NORCO Take 1 tablet by mouth every 6 (six) hours as needed for up to 30 days for moderate pain or severe pain.   Jynarque 90 & 30 MG Tbpk Generic drug: Tolvaptan Take 1 tablet by mouth 2 (two) times daily. Takes 60 mg in the AM and 30 mg 8 hours later. What changed: Another medication with the same name was removed. Continue taking this medication, and follow the directions you see here. Changed by: Hollice Espy, MD   lisinopril 10 MG tablet Commonly known as: ZESTRIL TAKE 1 TABLET(10 MG) BY MOUTH DAILY   naloxone 4 MG/0.1ML Liqd nasal spray kit Commonly known as: NARCAN For excess sedation from opioids.       Allergies: No Known Allergies  Family History: Family History  Problem Relation Age of Onset  . Arthritis Mother   . Hypertension Mother   . Arthritis Father   . Cancer Father   . Early death Father   . Heart disease Father   . Hypertension Father   . Kidney disease Father        POLYCYSTIC KIDNEY DISEASE  . Heart attack Father   . Hypertension Brother  Social History:  reports that she has been smoking cigarettes. She has been smoking about 0.50 packs per day. She has never used smokeless tobacco. She reports that she does not drink alcohol or use drugs.  ROS: UROLOGY Frequent Urination?: No Hard to postpone urination?: No Burning/pain with urination?: No Get up at night to urinate?: No Leakage of urine?: No Urine stream starts and stops?: No Trouble starting stream?: No Do you have to strain to urinate?: No Blood in urine?: No Urinary tract infection?: No Sexually transmitted disease?: No Injury to kidneys or bladder?: No Painful intercourse?: No Weak stream?: No Currently pregnant?: No Vaginal bleeding?: No Last menstrual period?: N  Gastrointestinal Nausea?: No Vomiting?: No Indigestion/heartburn?: No  Diarrhea?: No Constipation?: No  Constitutional Fever: No Night sweats?: No Weight loss?: No Fatigue?: No  Skin Skin rash/lesions?: No Itching?: No  Eyes Blurred vision?: No Double vision?: No  Ears/Nose/Throat Sore throat?: No Sinus problems?: No  Hematologic/Lymphatic Swollen glands?: No Easy bruising?: No  Cardiovascular Leg swelling?: No Chest pain?: No  Respiratory Cough?: No Shortness of breath?: No  Endocrine Excessive thirst?: No  Musculoskeletal Back pain?: Yes Joint pain?: No  Neurological Headaches?: No Dizziness?: No  Psychologic Depression?: No Anxiety?: No  Physical Exam: BP 132/68   Pulse 72   Ht _0  (1.626 m)   Wt 174 lb (78.9 kg)   BMI 29.87 kg/m   Constitutional:  Alert and oriented, No acute distress. HEENT: [place] AT, moist mucus membranes.  Trachea midline, no masses. Cardiovascular: No clubbing, cyanosis, or edema. Respiratory: Normal respiratory effort, no increased work of breathing. Skin: No rashes, bruises or suspicious lesions. Neurologic: Grossly intact, no focal deficits, moving all 4 extremities. Psychiatric: Normal mood and affect.  Laboratory Data: Lab Results  Component Value Date   WBC 9.2 02/08/2017   HGB 13.2 02/08/2017   HCT 37.7 02/08/2017   MCV 89.1 02/08/2017   PLT 197 02/08/2017    Lab Results  Component Value Date   CREATININE 0.98 02/08/2017    Urinalysis    Component Value Date/Time   APPEARANCEUR Cloudy (A) 11/17/2017 1033   GLUCOSEU Negative 11/17/2017 1033   BILIRUBINUR Negative 11/17/2017 1033   PROTEINUR Negative 11/17/2017 1033   NITRITE Negative 11/17/2017 1033   LEUKOCYTESUR Trace (A) 11/17/2017 1033    Lab Results  Component Value Date   LABMICR See below: 11/17/2017   WBCUA 6-10 (A) 11/17/2017   RBCUA None seen 11/17/2017   LABEPIT >10 (H) 11/17/2017   MUCUS Present (A) 11/17/2017   BACTERIA Many (A) 11/17/2017    Pertinent Imaging: MRI from 12/2017 was personally  reviewed.  Agreed with radiologic interpretation.  IMPRESSION: Numerous renal cysts of varying sizes and complexity, compatible with the patient's known polycystic kidney disease.  Some of these lesions are hemorrhagic and remain incompletely characterized in the absence of intravenous contrast administration. While many likely reflect benign hemorrhagic cysts, some could reflect solid lesions with superimposed hemorrhage. This is considered the baseline evaluation.  Notably, the patient had a normal GFR in October 2018 and (if this has not changed) would be eligible for MR contrast. As such, follow-up MR abdomen with/without contrast is suggested in 6 months. If the patient is not eligible for contrast administration, consider follow-up MRI abdomen without contrast in 6-12 months to assess for changes in size.   Electronically Signed   By: Julian Hy M.D.   On: 12/31/2017 12:29   In addition to the above, KUB was reviewed today.  She  has a approximately 3 to 4 mm stone in the right kidney as well as 2 3 to 4 mm stones in her left kidney.  Assessment & Plan:    1. Polycystic kidney disease Followed by nephrology Creatinine stable Warning signs of cystic bleeding was discussed and indications for further evaluation  2. Kidney stones Small kidney stones bilaterally, based on previous report, it appears that her stone burden is essentially stable and unchanged  Symptomatically stable  We will follow clinically, plan for repeat KUB in 2 years.  3. Chronic flank pain Followed by Dr. Andree Elk Pain is stable   Follow-up 2 years with KUB or sooner as needed  Hollice Espy, MD  [facility] [address], [place], [place] [address] [phone]

## 2018-12-04 DIAGNOSIS — F339 Major depressive disorder, recurrent, unspecified: Secondary | ICD-10-CM | POA: Diagnosis not present

## 2018-12-07 ENCOUNTER — Ambulatory Visit
Admission: RE | Admit: 2018-12-07 | Discharge: 2018-12-07 | Disposition: A | Discharge status: Home / Self Care | Payer: Medicare Other | Source: Ambulatory Visit | Attending: Anesthesiology | Admitting: Anesthesiology

## 2018-12-07 ENCOUNTER — Other Ambulatory Visit: Payer: Self-pay | Admitting: Anesthesiology

## 2018-12-07 ENCOUNTER — Encounter: Payer: Self-pay | Admitting: Anesthesiology

## 2018-12-07 ENCOUNTER — Ambulatory Visit (HOSPITAL_BASED_OUTPATIENT_CLINIC_OR_DEPARTMENT_OTHER): Payer: Medicare Other | Admitting: Anesthesiology

## 2018-12-07 ENCOUNTER — Other Ambulatory Visit: Payer: Self-pay

## 2018-12-07 VITALS — BP 136/84 | HR 100 | Temp 99.2°F | Resp 18 | Ht 64.0 in | Wt 170.0 lb

## 2018-12-07 DIAGNOSIS — F1190 Opioid use, unspecified, uncomplicated: Secondary | ICD-10-CM

## 2018-12-07 DIAGNOSIS — R52 Pain, unspecified: Secondary | ICD-10-CM

## 2018-12-07 DIAGNOSIS — M5136 Other intervertebral disc degeneration, lumbar region: Secondary | ICD-10-CM | POA: Insufficient documentation

## 2018-12-07 DIAGNOSIS — G8929 Other chronic pain: Secondary | ICD-10-CM | POA: Insufficient documentation

## 2018-12-07 DIAGNOSIS — M47816 Spondylosis without myelopathy or radiculopathy, lumbar region: Secondary | ICD-10-CM | POA: Insufficient documentation

## 2018-12-07 DIAGNOSIS — Q613 Polycystic kidney, unspecified: Secondary | ICD-10-CM | POA: Insufficient documentation

## 2018-12-07 DIAGNOSIS — M5442 Lumbago with sciatica, left side: Secondary | ICD-10-CM | POA: Insufficient documentation

## 2018-12-07 DIAGNOSIS — G894 Chronic pain syndrome: Secondary | ICD-10-CM

## 2018-12-07 MED ORDER — SODIUM CHLORIDE (PF) 0.9 % IJ SOLN
INTRAMUSCULAR | Status: AC
  Filled 2018-12-07: qty 10

## 2018-12-07 MED ORDER — SODIUM CHLORIDE 0.9% FLUSH
10.0000 mL | Freq: Once | INTRAVENOUS | Status: AC
  Administered 2018-12-07: 14:00:00 10 mL

## 2018-12-07 MED ORDER — IOPAMIDOL (ISOVUE-M 200) INJECTION 41%: 20.0000 mL | Freq: Once | INTRAMUSCULAR | Status: DC | PRN

## 2018-12-07 MED ORDER — LIDOCAINE HCL (PF) 1 % IJ SOLN
INTRAMUSCULAR | Status: AC
  Filled 2018-12-07: qty 5

## 2018-12-07 MED ORDER — LIDOCAINE HCL (PF) 1 % IJ SOLN
5.0000 mL | Freq: Once | INTRAMUSCULAR | Status: AC
  Administered 2018-12-07: 5 mL via SUBCUTANEOUS

## 2018-12-07 MED ORDER — ROPIVACAINE HCL 2 MG/ML IJ SOLN
INTRAMUSCULAR | Status: AC
  Filled 2018-12-07: qty 10

## 2018-12-07 MED ORDER — TRIAMCINOLONE ACETONIDE 40 MG/ML IJ SUSP
40.0000 mg | Freq: Once | INTRAMUSCULAR | Status: AC
  Administered 2018-12-07: 40 mg

## 2018-12-07 MED ORDER — TRIAMCINOLONE ACETONIDE 40 MG/ML IJ SUSP
INTRAMUSCULAR | Status: AC
  Filled 2018-12-07: qty 1

## 2018-12-07 MED ORDER — ROPIVACAINE HCL 2 MG/ML IJ SOLN
10.0000 mL | Freq: Once | INTRAMUSCULAR | Status: AC
  Administered 2018-12-07: 10 mL via EPIDURAL

## 2018-12-07 NOTE — Progress Notes (Signed)
Safety precautions to be maintained throughout the outpatient stay will include: orient to surroundings, keep bed in low position, maintain call bell within reach at all times, provide assistance with transfer out of bed and ambulation.  

## 2018-12-07 NOTE — Progress Notes (Signed)
Subjective:  Patient ID: Megan Brock, female    DOB: March 23, 1981  Age: 38 y.o. MRN: [id number]  CC: Back Pain (low)   Procedure: L5-S1 epidural steroid under fluoroscopic guidance without sedation  HPI Megan Brock presents for reevaluation.  She was last seen a few months ago and has had previous epidurals for her low back pain.  In the past she has had left lower extremity pain however this has resolved following an epidural in 2019.  Her last epidural was in February of this year and she gets 75% relief from her right lower extremity pain lasting approximately 2 months.  She is continue to do her physical therapy stretching strengthening exercises with minimal improvement.  She takes her medications as prescribed for low back pain and polycystic kidney disease and these continue to work well for her.  No new weakness is reported and her bowel and bladder function have been stable.  She continues to follow-up with her urologist and kidney doctors for her polycystic kidney disease. Outpatient Medications Prior to Visit  Medication Sig Dispense Refill  . amLODipine (NORVASC) 5 MG tablet TAKE 1 TABLET(5 MG) BY MOUTH DAILY 30 tablet 11  . cyclobenzaprine (FLEXERIL) 10 MG tablet TK 1 T PO HS    . HYDROcodone-acetaminophen (NORCO) 10-325 MG tablet Take 1 tablet by mouth every 6 (six) hours as needed for moderate pain or severe pain. 45 tablet 0  . [START ON 12/25/2018] HYDROcodone-acetaminophen (NORCO) 10-325 MG tablet Take 1 tablet by mouth every 6 (six) hours as needed for moderate pain or severe pain. 45 tablet 0  . lisinopril (PRINIVIL,ZESTRIL) 10 MG tablet TAKE 1 TABLET(10 MG) BY MOUTH DAILY 90 tablet 11  . morphine (MS CONTIN) 60 MG 12 hr tablet Take 1 tablet (60 mg total) by mouth 3 (three) times daily. 75 tablet 0  . [START ON 12/25/2018] morphine (MS CONTIN) 60 MG 12 hr tablet Take 1 tablet (60 mg total) by mouth 3 (three) times daily. 75 tablet 0  . naloxone (NARCAN) nasal spray 4 mg/0.1  mL For excess sedation from opioids. 1 kit 2  . Tolvaptan (JYNARQUE) 90 & 30 MG TBPK Take 1 tablet by mouth 2 (two) times daily. Takes 60 mg in the AM and 30 mg 8 hours later.      No facility-administered medications prior to visit.     Review of Systems CNS: No confusion or sedation Cardiac: No angina or palpitations GI: No abdominal pain or constipation Constitutional: No nausea vomiting fevers or chills  Objective:  BP 138/75   Pulse (!) 115   Resp 18   Ht _0  (1.626 m)   Wt 170 lb (77.1 kg)   SpO2 100%   BMI 29.18 kg/m    BP Readings from Last 3 Encounters:  12/07/18 138/75  11/22/18 132/68  07/06/18 127/77     Wt Readings from Last 3 Encounters:  12/07/18 170 lb (77.1 kg)  11/22/18 174 lb (78.9 kg)  07/06/18 170 lb (77.1 kg)     Physical Exam Pt is alert and oriented PERRL EOMI HEART IS RRR no murmur or rub LCTA no wheezing or rales MUSCULOSKELETAL reveals some paraspinous muscle tenderness but no overt trigger points.  She walks with a minimally antalgic gait and her muscle tone and bulk is at base  Labs  No results found for: HGBA1C Lab Results  Component Value Date   LDLCALC 105 (H) 07/17/2015   CREATININE 0.98 02/08/2017    -------------------------------------------------------------------------------------------------------------------- Lab  Results  Component Value Date   WBC 9.2 02/08/2017   HGB 13.2 02/08/2017   HCT 37.7 02/08/2017   PLT 197 02/08/2017   GLUCOSE 87 02/08/2017   CHOL 174 07/17/2015   TRIG 139 07/17/2015   HDL 41 07/17/2015   LDLCALC 105 (H) 07/17/2015   ALT 10 02/08/2017   AST 12 02/08/2017   NA 140 02/08/2017   K 4.0 02/08/2017   CL 105 02/08/2017   CREATININE 0.98 02/08/2017   BUN 18 02/08/2017   CO2 29 02/08/2017   TSH 1.60 02/08/2017    --------------------------------------------------------------------------------------------------------------------- Dg Abd 1 View  Result Date: 11/22/2018 CLINICAL  DATA:  Polycystic kidney disease.  Kidney stones. EXAM: ABDOMEN - 1 VIEW COMPARISON:  MRI of the abdomen 12/31/2017. One-view abdomen radiograph 01/19/2013. FINDINGS: A small stem projects over the lower pole of the left kidney. There may be a small stone in the central portion of the right kidney. The right kidney is moderately obscured by stool. No other focal stones are evident. Bowel gas pattern is normal. Axial skeleton is within normal limits. IMPRESSION: 1. Small stone at the lower pole of the left kidney is not previously seen. 2. Possible stone at the midportion of the right kidney. Electronically Signed   By: San Morelle M.D.   On: 11/22/2018 14:48     Assessment & Plan:   Megan Brock was seen today for back pain.  Diagnoses and all orders for this visit:  Lumbar spondylosis  Chronic bilateral low back pain with left-sided sciatica  Chronic pain syndrome  Polycystic kidney disease  Chronic, continuous use of opioids  DDD (degenerative disc disease), lumbar        ----------------------------------------------------------------------------------------------------------------------  Problem List Items Addressed This Visit      Unprioritized   Chronic lower back pain   Chronic pain syndrome (Chronic)   Chronic, continuous use of opioids   Lumbar spondylosis - Primary (Chronic)    Other Visit Diagnoses    Polycystic kidney disease       DDD (degenerative disc disease), lumbar            ----------------------------------------------------------------------------------------------------------------------  1. Lumbar spondylosis  Continue with stretching strengthening exercises and core strengthening as previously reviewed.  2. Chronic bilateral low back pain with right sided sciatica We will proceed with a lumbar epidural steroid injection today.  We have gone over the risks and benefits of this in detail.  I want her to continue with her stretching  strengthening and aerobic conditioning with return to clinic in 1 month for medication refill and will defer on repeat epidural until necessary.  3. Chronic pain syndrome Continue current medication management  4. Polycystic kidney disease Continue follow-up with her urologist and nephrologist  5. Chronic, continuous use of opioids As above  6. DDD (degenerative disc disease), lumbar    ----------------------------------------------------------------------------------------------------------------------  I am having Megan Brock. Gelardi maintain her naloxone, amLODipine, lisinopril, Tolvaptan, cyclobenzaprine, HYDROcodone-acetaminophen, HYDROcodone-acetaminophen, morphine, and morphine.   No orders of the defined types were placed in this encounter.  Patient's Medications  New Prescriptions   No medications on file  Previous Medications   AMLODIPINE (NORVASC) 5 MG TABLET    TAKE 1 TABLET(5 MG) BY MOUTH DAILY   CYCLOBENZAPRINE (FLEXERIL) 10 MG TABLET    TK 1 T PO HS   HYDROCODONE-ACETAMINOPHEN (NORCO) 10-325 MG TABLET    Take 1 tablet by mouth every 6 (six) hours as needed for moderate pain or severe pain.   HYDROCODONE-ACETAMINOPHEN (NORCO) 10-325 MG TABLET  Take 1 tablet by mouth every 6 (six) hours as needed for moderate pain or severe pain.   LISINOPRIL (PRINIVIL,ZESTRIL) 10 MG TABLET    TAKE 1 TABLET(10 MG) BY MOUTH DAILY   MORPHINE (MS CONTIN) 60 MG 12 HR TABLET    Take 1 tablet (60 mg total) by mouth 3 (three) times daily.   MORPHINE (MS CONTIN) 60 MG 12 HR TABLET    Take 1 tablet (60 mg total) by mouth 3 (three) times daily.   NALOXONE (NARCAN) NASAL SPRAY 4 MG/0.1 ML    For excess sedation from opioids.   TOLVAPTAN (JYNARQUE) 90 & 30 MG TBPK    Take 1 tablet by mouth 2 (two) times daily. Takes 60 mg in the AM and 30 mg 8 hours later.   Modified Medications   No medications on file  Discontinued Medications   No medications on file    ----------------------------------------------------------------------------------------------------------------------  Follow-up: Return in about 1 month (around 01/07/2019) for evaluation, med refill.  As above Procedure: L5-S1 LESI with fluoroscopic guidance and no moderate sedation  NOTE: The risks, benefits, and expectations of the procedure have been discussed and explained to the patient who was understanding and in agreement with suggested treatment plan. No guarantees were made.  DESCRIPTION OF PROCEDURE: Lumbar epidural steroid injection with 66m  IV Versed, EKG, blood pressure, pulse, and pulse oximetry monitoring. The procedure was performed with the patient in the prone position under fluoroscopic guidance.  Sterile prep x3 was initiated and I then injected subcutaneous lidocaine to the overlying oh L5-S1 site after its fluoroscopic identifictation.  Using strict aseptic technique, I then advanced an 18-gauge Tuohy epidural needle in the midline using interlaminar approach via loss-of-resistance to saline technique. There was negative aspiration for heme or  CSF.  I then confirmed position with both AP and Lateral fluoroscan.  2 cc of contrast dye were injected and a  total of 5 mL of Preservative-Free normal saline mixed with 40 mg of Kenalog and 1cc Ropicaine 0.2 percent were injected incrementally via the  epidurally placed needle. The needle was removed. The patient tolerated the injection well and was convalesced and discharged to home in stable condition. Should the patient have any post procedure difficulty they have been instructed on how to contact [place].    JMolli Barrows MD

## 2018-12-08 ENCOUNTER — Telehealth: Payer: Self-pay

## 2018-12-08 NOTE — Telephone Encounter (Signed)
Post procedure phone call.   

## 2018-12-19 ENCOUNTER — Other Ambulatory Visit: Payer: Self-pay | Admitting: Anesthesiology

## 2018-12-19 ENCOUNTER — Telehealth: Payer: Self-pay

## 2018-12-19 NOTE — Telephone Encounter (Signed)
Dr Andree Elk, can you please order her Flexeril?

## 2018-12-19 NOTE — Telephone Encounter (Signed)
She forgot to ask Dr. Andree Elk to call out her flexeril. Please ask him to do so. Thanks

## 2018-12-21 NOTE — Telephone Encounter (Signed)
We have a note in his inoox and I have texted him about it on his cell. She should have enough to last her until 01-10-19- if my calculations are correct. She had a script on 05/26 with 3 refills.

## 2019-01-03 ENCOUNTER — Other Ambulatory Visit: Payer: Self-pay

## 2019-01-03 ENCOUNTER — Ambulatory Visit: Payer: Medicare Other | Attending: Anesthesiology | Admitting: Anesthesiology

## 2019-01-03 DIAGNOSIS — M5441 Lumbago with sciatica, right side: Secondary | ICD-10-CM

## 2019-01-03 DIAGNOSIS — M47816 Spondylosis without myelopathy or radiculopathy, lumbar region: Secondary | ICD-10-CM

## 2019-01-03 DIAGNOSIS — F1190 Opioid use, unspecified, uncomplicated: Secondary | ICD-10-CM

## 2019-01-03 DIAGNOSIS — G894 Chronic pain syndrome: Secondary | ICD-10-CM

## 2019-01-03 DIAGNOSIS — M5442 Lumbago with sciatica, left side: Secondary | ICD-10-CM | POA: Diagnosis not present

## 2019-01-03 DIAGNOSIS — M51369 Other intervertebral disc degeneration, lumbar region without mention of lumbar back pain or lower extremity pain: Secondary | ICD-10-CM

## 2019-01-03 DIAGNOSIS — G8929 Other chronic pain: Secondary | ICD-10-CM

## 2019-01-03 DIAGNOSIS — M5136 Other intervertebral disc degeneration, lumbar region: Secondary | ICD-10-CM

## 2019-01-03 DIAGNOSIS — Q613 Polycystic kidney, unspecified: Secondary | ICD-10-CM

## 2019-01-03 MED ORDER — HYDROCODONE-ACETAMINOPHEN 10-325 MG PO TABS: 1.0000 | ORAL_TABLET | Freq: Four times a day (QID) | ORAL | 0 refills | Status: AC | PRN

## 2019-01-03 MED ORDER — HYDROCODONE-ACETAMINOPHEN 10-325 MG PO TABS: 1.0000 | ORAL_TABLET | Freq: Four times a day (QID) | ORAL | 0 refills | Status: DC | PRN

## 2019-01-03 MED ORDER — MORPHINE SULFATE ER 60 MG PO TBCR: 60.0000 mg | EXTENDED_RELEASE_TABLET | Freq: Three times a day (TID) | ORAL | 0 refills | Status: DC

## 2019-01-03 MED ORDER — CYCLOBENZAPRINE HCL 10 MG PO TABS: 10.0000 mg | ORAL_TABLET | Freq: Every day | ORAL | 5 refills | Status: AC

## 2019-01-03 NOTE — Progress Notes (Signed)
Virtual Visit via Video Note  I connected with [name] on 01/03/19 at  1:45 PM EDT by a video enabled telemedicine application and verified that I am speaking with the correct person using two identifiers.  Location: Patient: Home Provider: Pain control center   I discussed the limitations of evaluation and management by telemedicine and the availability of in person appointments. The patient expressed understanding and agreed to proceed.  History of Present Illness: I spoke with Megan Brock today via Airline pilot for her appointment.  She has done well following her epidural at her most recent evaluation and is having less calf and leg pain.  She still having some low lumbar flank pain consistent with her baseline pain and this radiates into the hip and buttock region.  She is trying to do her stretching strengthening exercises with some success however continues to be dependent on her opioid medications for chronic pain management and these are working well.  She denies any side effects with the medications.  No diverting or illicit use.  She continues to derive good functional lifestyle improvement with them as well.  Otherwise she is in her usual state of health.  She does mention she is scheduled to go to [place] for a family vacation here in the next few days and this is keeping her active.   Observations/Objective: Current Outpatient Medications:  .  amLODipine (NORVASC) 5 MG tablet, TAKE 1 TABLET(5 MG) BY MOUTH DAILY, Disp: 30 tablet, Rfl: 11 .  cyclobenzaprine (FLEXERIL) 10 MG tablet, Take 1 tablet (10 mg total) by mouth at bedtime., Disp: 30 tablet, Rfl: 5 .  [START ON 01/23/2019] HYDROcodone-acetaminophen (NORCO) 10-325 MG tablet, Take 1 tablet by mouth every 6 (six) hours as needed for moderate pain or severe pain., Disp: 45 tablet, Rfl: 0 .  [START ON 02/22/2019] HYDROcodone-acetaminophen (NORCO) 10-325 MG tablet, Take 1 tablet by mouth every 6 (six) hours as  needed for moderate pain or severe pain., Disp: 75 tablet, Rfl: 0 .  lisinopril (PRINIVIL,ZESTRIL) 10 MG tablet, TAKE 1 TABLET(10 MG) BY MOUTH DAILY, Disp: 90 tablet, Rfl: 11 .  [START ON 01/23/2019] morphine (MS CONTIN) 60 MG 12 hr tablet, Take 1 tablet (60 mg total) by mouth 3 (three) times daily., Disp: 75 tablet, Rfl: 0 .  [START ON 02/22/2019] morphine (MS CONTIN) 60 MG 12 hr tablet, Take 1 tablet (60 mg total) by mouth 3 (three) times daily., Disp: 75 tablet, Rfl: 0 .  naloxone (NARCAN) nasal spray 4 mg/0.1 mL, For excess sedation from opioids., Disp: 1 kit, Rfl: 2 .  Tolvaptan (JYNARQUE) 90 & 30 MG TBPK, Take 1 tablet by mouth 2 (two) times daily. Takes 60 mg in the AM and 30 mg 8 hours later. , Disp: , Rfl:   Assessment and Plan: 1. Lumbar spondylosis   2. Chronic bilateral low back pain with left-sided sciatica   3. Chronic pain syndrome   4. Polycystic kidney disease   5. Chronic, continuous use of opioids   6. DDD (degenerative disc disease), lumbar   7. Chronic bilateral low back pain with right-sided sciatica   Based on our discussion today and upon review of the [facility] practitioner database information I am going to refill her medications for her hydrocodone and MS Contin for the next 2 months.  These will be sent in for October 6 and November 5 and we will have her scheduled for return to clinic in 2 months.  She is to continue  with her back stretching strengthening exercises as reviewed and contact us should she have any problems with her pain management.  She is also to follow-up with Dr. Zollie Scale for her baseline nephrology care.   Follow Up Instructions:    I discussed the assessment and treatment plan with the patient. The patient was provided an opportunity to ask questions and all were answered. The patient agreed with the plan and demonstrated an understanding of the instructions.   The patient was advised to call back or seek an in-person evaluation if the symptoms  worsen or if the condition fails to improve as anticipated.  I provided 30 minutes of non-face-to-face time during this encounter.   Molli Barrows, MD

## 2019-01-25 DIAGNOSIS — I10 Essential (primary) hypertension: Secondary | ICD-10-CM | POA: Diagnosis not present

## 2019-01-25 DIAGNOSIS — R809 Proteinuria, unspecified: Secondary | ICD-10-CM | POA: Diagnosis not present

## 2019-01-25 DIAGNOSIS — N200 Calculus of kidney: Secondary | ICD-10-CM | POA: Diagnosis not present

## 2019-01-25 DIAGNOSIS — Q612 Polycystic kidney, adult type: Secondary | ICD-10-CM | POA: Diagnosis not present

## 2019-01-30 DIAGNOSIS — F339 Major depressive disorder, recurrent, unspecified: Secondary | ICD-10-CM | POA: Diagnosis not present

## 2019-02-22 ENCOUNTER — Telehealth: Payer: Self-pay | Admitting: Anesthesiology

## 2019-02-22 NOTE — Telephone Encounter (Signed)
Wants to speak to Nurse [phone]

## 2019-02-23 NOTE — Telephone Encounter (Signed)
Patient called to notify us that she received Hydrocodone script with a quantity of #75 instead of the normal #45.  Patient states that she put the extra 30 pills in her safe.  She has an appointment with Dr Andree Elk next week virtually and will notify him at that time.  I will send this to Dr Andree Elk as well so that he is aware.

## 2019-02-26 ENCOUNTER — Encounter: Payer: Self-pay | Admitting: Anesthesiology

## 2019-02-26 ENCOUNTER — Ambulatory Visit: Payer: Medicare Other | Attending: Anesthesiology | Admitting: Anesthesiology

## 2019-02-26 ENCOUNTER — Other Ambulatory Visit: Payer: Self-pay

## 2019-02-26 DIAGNOSIS — F1190 Opioid use, unspecified, uncomplicated: Secondary | ICD-10-CM

## 2019-02-26 DIAGNOSIS — M5442 Lumbago with sciatica, left side: Secondary | ICD-10-CM

## 2019-02-26 DIAGNOSIS — Z79891 Long term (current) use of opiate analgesic: Secondary | ICD-10-CM

## 2019-02-26 DIAGNOSIS — M5136 Other intervertebral disc degeneration, lumbar region: Secondary | ICD-10-CM

## 2019-02-26 DIAGNOSIS — Q613 Polycystic kidney, unspecified: Secondary | ICD-10-CM

## 2019-02-26 DIAGNOSIS — G894 Chronic pain syndrome: Secondary | ICD-10-CM

## 2019-02-26 DIAGNOSIS — M47816 Spondylosis without myelopathy or radiculopathy, lumbar region: Secondary | ICD-10-CM

## 2019-02-26 DIAGNOSIS — M5432 Sciatica, left side: Secondary | ICD-10-CM

## 2019-02-26 DIAGNOSIS — F339 Major depressive disorder, recurrent, unspecified: Secondary | ICD-10-CM | POA: Diagnosis not present

## 2019-02-26 DIAGNOSIS — G8929 Other chronic pain: Secondary | ICD-10-CM

## 2019-02-26 MED ORDER — MORPHINE SULFATE ER 60 MG PO TBCR: 60.0000 mg | EXTENDED_RELEASE_TABLET | Freq: Three times a day (TID) | ORAL | 0 refills | Status: DC

## 2019-02-26 MED ORDER — HYDROCODONE-ACETAMINOPHEN 10-325 MG PO TABS: 1.0000 | ORAL_TABLET | Freq: Four times a day (QID) | ORAL | 0 refills | Status: AC | PRN

## 2019-02-26 MED ORDER — HYDROCODONE-ACETAMINOPHEN 10-325 MG PO TABS: 1.0000 | ORAL_TABLET | Freq: Four times a day (QID) | ORAL | 0 refills | Status: DC | PRN

## 2019-02-26 NOTE — Progress Notes (Signed)
Virtual Visit via Video Note  I connected with Megan Brock on 02/26/19 at  1:45 PM EST by a video enabled telemedicine application and verified that I am speaking with the correct person using two identifiers.  Location: Patient: Home Provider: Pain control center   I discussed the limitations of evaluation and management by telemedicine and the availability of in person appointments. The patient expressed understanding and agreed to proceed.  History of Present Illness: I spoke with [name] via Airline pilot today and she appears to be doing reasonably well with her low back pain.  This has been stable in nature.  She occasionally gets some left radicular type pain worse with activity or prolonged standing but this is been much more tolerable following her series of epidural injections.  She denies any frequent right side lower leg pain.  Her bowel bladder function has been stable and she continues to follow along with her nephrologist for her polycystic kidney disease.  Otherwise she is taking her medications and getting good relief from these based on our discussion today.  No side effects are reported and she continues to do well with these.   Observations/Objective: Current Outpatient Medications:  .  amLODipine (NORVASC) 5 MG tablet, TAKE 1 TABLET(5 MG) BY MOUTH DAILY, Disp: 30 tablet, Rfl: 11 .  [START ON 03/23/2019] HYDROcodone-acetaminophen (NORCO) 10-325 MG tablet, Take 1 tablet by mouth every 6 (six) hours as needed for moderate pain or severe pain., Disp: 45 tablet, Rfl: 0 .  [START ON 04/22/2019] HYDROcodone-acetaminophen (NORCO) 10-325 MG tablet, Take 1 tablet by mouth every 6 (six) hours as needed for moderate pain or severe pain., Disp: 45 tablet, Rfl: 0 .  lisinopril (PRINIVIL,ZESTRIL) 10 MG tablet, TAKE 1 TABLET(10 MG) BY MOUTH DAILY, Disp: 90 tablet, Rfl: 11 .  [START ON 03/23/2019] morphine (MS CONTIN) 60 MG 12 hr tablet, Take 1 tablet (60 mg total) by mouth 3  (three) times daily., Disp: 75 tablet, Rfl: 0 .  [START ON 04/22/2019] morphine (MS CONTIN) 60 MG 12 hr tablet, Take 1 tablet (60 mg total) by mouth 3 (three) times daily., Disp: 75 tablet, Rfl: 0 .  naloxone (NARCAN) nasal spray 4 mg/0.1 mL, For excess sedation from opioids., Disp: 1 kit, Rfl: 2 .  Tolvaptan (JYNARQUE) 90 & 30 MG TBPK, Take 1 tablet by mouth 2 (two) times daily. Takes 60 mg in the AM and 30 mg 8 hours later. , Disp: , Rfl:    Assessment and Plan: 1. Lumbar spondylosis   2. Chronic bilateral low back pain with left-sided sciatica   3. Chronic pain syndrome   4. DDD (degenerative disc disease), lumbar   5. Chronic, continuous use of opioids   6. Polycystic kidney disease   7. Long term current use of opiate analgesic   8. Left sided sciatica   Based on our discussion today and upon review of the [facility] practitioner database information I am going to refill her medications for the next 2 months dated for December 4 and January 3 of 2021.  This will be for her MS Contin and hydrocodone.  In the meantime she is to continue follow-up with her primary care physicians and nephrologist for her medical care and contact us of the pain control center should she have any problems with her pain medications or pain management.  I schedule her for a 39-monthreturn to clinic and will defer on any repeat epidural injections at this time.   Follow Up  Instructions: Return to clinic in 2 months    I discussed the assessment and treatment plan with the patient. The patient was provided an opportunity to ask questions and all were answered. The patient agreed with the plan and demonstrated an understanding of the instructions.   The patient was advised to call back or seek an in-person evaluation if the symptoms worsen or if the condition fails to improve as anticipated.  I provided 30 minutes of non-face-to-face time during this encounter.   Molli Barrows, MD

## 2019-03-19 ENCOUNTER — Other Ambulatory Visit: Payer: Self-pay | Admitting: Family Medicine

## 2019-03-19 DIAGNOSIS — R030 Elevated blood-pressure reading, without diagnosis of hypertension: Secondary | ICD-10-CM

## 2019-03-19 NOTE — Telephone Encounter (Signed)
Have sent 90 day prescription. Megan Brock needs to schedule o.v.

## 2019-03-19 NOTE — Telephone Encounter (Signed)
Requested medication (s) are due for refill today: yes  Requested medication (s) are on the active medication list: yes  Last refill:  02/17/2019  Future visit scheduled: no  Notes to clinic:  Overdue for office visit  Review for refill   Requested Prescriptions  Pending Prescriptions Disp Refills   amLODipine (NORVASC) 5 MG tablet [Pharmacy Med Name: AMLODIPINE BESYLATE 5MG  TABLETS] 30 tablet 11    Sig: TAKE 1 TABLET(5 MG) BY MOUTH DAILY     Cardiovascular:  Calcium Channel Blockers Failed - 03/19/2019  6:23 AM      Failed - Valid encounter within last 6 months    Recent Outpatient Visits          1 year ago Hypertension secondary to other renal disorders   [facility], MD   1 year ago Acute otitis media, unspecified otitis media type   [facility], MD   1 year ago Hypertension secondary to other renal disorders   [facility], MD   2 years ago Hypertension secondary to other renal disorders   [facility], MD   2 years ago Elevated blood pressure reading   [facility], MD      Future Appointments            In 1 month  [facility]   In 1 year [facility], MD [facility]           Passed - Last BP in normal range    BP Readings from Last 1 Encounters:  12/07/18 136/84          lisinopril (ZESTRIL) 10 MG tablet [Pharmacy Med Name: LISINOPRIL 10MG  TABLETS] 90 tablet 11    Sig: TAKE 1 TABLET(10 MG) BY MOUTH DAILY     Cardiovascular:  ACE Inhibitors Failed - 03/19/2019  6:23 AM      Failed - Cr in normal range and within 180 days    Creat  Date Value Ref Range Status  02/08/2017 0.98 0.50 - 1.10 mg/dL Final         Failed - K in normal range and within 180 days    Potassium  Date Value Ref Range Status  02/08/2017 4.0 3.5 - 5.3 mmol/L Final         Failed  - Valid encounter within last 6 months    Recent Outpatient Visits          1 year ago Hypertension secondary to other renal disorders   [facility] 02/10/2017, MD   1 year ago Acute otitis media, unspecified otitis media type   [facility] Malva Limes, MD   1 year ago Hypertension secondary to other renal disorders   [facility] Malva Limes, MD   2 years ago Hypertension secondary to other renal disorders   [facility] Malva Limes, MD   2 years ago Elevated blood pressure reading   [facility] Malva Limes, MD      Future Appointments            In 1 month  [facility]   In 1 year Malva Limes, MD [facility]           Passed - Patient is not pregnant      Passed - Last BP in normal range  BP Readings from Last 1 Encounters:  12/07/18 136/84

## 2019-03-22 DIAGNOSIS — Q612 Polycystic kidney, adult type: Secondary | ICD-10-CM | POA: Diagnosis not present

## 2019-03-22 DIAGNOSIS — I10 Essential (primary) hypertension: Secondary | ICD-10-CM | POA: Diagnosis not present

## 2019-03-22 DIAGNOSIS — N200 Calculus of kidney: Secondary | ICD-10-CM | POA: Diagnosis not present

## 2019-03-22 DIAGNOSIS — R809 Proteinuria, unspecified: Secondary | ICD-10-CM | POA: Diagnosis not present

## 2019-04-18 ENCOUNTER — Other Ambulatory Visit: Payer: Self-pay | Admitting: Family Medicine

## 2019-04-18 DIAGNOSIS — Z720 Tobacco use: Secondary | ICD-10-CM

## 2019-04-23 NOTE — Progress Notes (Signed)
N/S apt.  This encounter was created in error - please disregard. 

## 2019-04-24 ENCOUNTER — Other Ambulatory Visit: Payer: Self-pay

## 2019-05-19 DIAGNOSIS — F339 Major depressive disorder, recurrent, unspecified: Secondary | ICD-10-CM | POA: Diagnosis not present

## 2019-05-21 ENCOUNTER — Encounter: Payer: Self-pay | Admitting: Anesthesiology

## 2019-05-21 ENCOUNTER — Other Ambulatory Visit: Payer: Self-pay

## 2019-05-21 ENCOUNTER — Ambulatory Visit: Payer: Medicare Other | Attending: Anesthesiology | Admitting: Anesthesiology

## 2019-05-21 DIAGNOSIS — Q613 Polycystic kidney, unspecified: Secondary | ICD-10-CM | POA: Diagnosis not present

## 2019-05-21 DIAGNOSIS — G894 Chronic pain syndrome: Secondary | ICD-10-CM

## 2019-05-21 DIAGNOSIS — M5136 Other intervertebral disc degeneration, lumbar region: Secondary | ICD-10-CM

## 2019-05-21 DIAGNOSIS — F1190 Opioid use, unspecified, uncomplicated: Secondary | ICD-10-CM

## 2019-05-21 DIAGNOSIS — M47816 Spondylosis without myelopathy or radiculopathy, lumbar region: Secondary | ICD-10-CM | POA: Diagnosis not present

## 2019-05-21 DIAGNOSIS — F17210 Nicotine dependence, cigarettes, uncomplicated: Secondary | ICD-10-CM | POA: Diagnosis not present

## 2019-05-21 DIAGNOSIS — M51369 Other intervertebral disc degeneration, lumbar region without mention of lumbar back pain or lower extremity pain: Secondary | ICD-10-CM

## 2019-05-21 DIAGNOSIS — M5432 Sciatica, left side: Secondary | ICD-10-CM

## 2019-05-21 DIAGNOSIS — Z79891 Long term (current) use of opiate analgesic: Secondary | ICD-10-CM

## 2019-05-21 DIAGNOSIS — G8929 Other chronic pain: Secondary | ICD-10-CM

## 2019-05-21 DIAGNOSIS — M5442 Lumbago with sciatica, left side: Secondary | ICD-10-CM

## 2019-05-21 MED ORDER — HYDROCODONE-ACETAMINOPHEN 10-325 MG PO TABS: 1.0000 | ORAL_TABLET | Freq: Two times a day (BID) | ORAL | 0 refills | Status: DC

## 2019-05-21 MED ORDER — MORPHINE SULFATE ER 60 MG PO TBCR: 60.0000 mg | EXTENDED_RELEASE_TABLET | Freq: Three times a day (TID) | ORAL | 0 refills | Status: DC

## 2019-05-21 MED ORDER — HYDROCODONE-ACETAMINOPHEN 10-325 MG PO TABS: 1.0000 | ORAL_TABLET | Freq: Two times a day (BID) | ORAL | 0 refills | Status: AC

## 2019-05-21 MED ORDER — CYCLOBENZAPRINE HCL 10 MG PO TABS: 10.0000 mg | ORAL_TABLET | Freq: Two times a day (BID) | ORAL | 3 refills | Status: AC

## 2019-05-22 NOTE — Progress Notes (Signed)
Virtual Visit via Video Note  I connected with Megan Brock on 05/22/19 at  2:30 PM EST by a video enabled telemedicine application and verified that I am speaking with the correct person using two identifiers.  Location: Patient: Home Provider: Pain control center   I discussed the limitations of evaluation and management by telemedicine and the availability of in person appointments. The patient expressed understanding and agreed to proceed.  History of Present Illness: I spoke with Megan Brock today via video for the virtual conference.  She states that her low back pain is been stable in nature.  Her polycystic kidney disease is also been stable and she has started a new medication for that.  Her liver function tests are being followed by her kidney doctors.  In regards to her low back pain the quality characteristic and distribution been stable in nature with no significant changes reported.  She is still getting some sciatica but feels that she has still continued to derive good functional improvement following her last epidural steroid injection.  Otherwise she is in her usual state of health.  Her strength is been stable bowel bladder function been stable and no difficulties with the medication are reported.  She continues to get good relief with them.    Observations/Objective:  Current Outpatient Medications:  .  amLODipine (NORVASC) 5 MG tablet, TAKE 1 TABLET(5 MG) BY MOUTH DAILY, Disp: 90 tablet, Rfl: 0 .  cyclobenzaprine (FLEXERIL) 10 MG tablet, Take 1 tablet (10 mg total) by mouth 2 (two) times daily., Disp: 60 tablet, Rfl: 3 .  HYDROcodone-acetaminophen (NORCO) 10-325 MG tablet, Take 1 tablet by mouth 2 (two) times daily., Disp: 45 tablet, Rfl: 0 .  [START ON 06/20/2019] HYDROcodone-acetaminophen (NORCO) 10-325 MG tablet, Take 1 tablet by mouth 2 (two) times daily., Disp: 45 tablet, Rfl: 0 .  lisinopril (ZESTRIL) 10 MG tablet, TAKE 1 TABLET(10 MG) BY MOUTH DAILY, Disp: 90 tablet,  Rfl: 0 .  morphine (MS CONTIN) 60 MG 12 hr tablet, Take 1 tablet (60 mg total) by mouth 3 (three) times daily., Disp: 75 tablet, Rfl: 0 .  [START ON 06/20/2019] morphine (MS CONTIN) 60 MG 12 hr tablet, Take 1 tablet (60 mg total) by mouth 3 (three) times daily., Disp: 75 tablet, Rfl: 0 .  naloxone (NARCAN) nasal spray 4 mg/0.1 mL, For excess sedation from opioids., Disp: 1 kit, Rfl: 2 .  Tolvaptan (JYNARQUE) 90 & 30 MG TBPK, Take 1 tablet by mouth 2 (two) times daily. Takes 60 mg in the AM and 30 mg 8 hours later. , Disp: , Rfl:   Assessment and Plan: 1. Lumbar spondylosis   2. Chronic bilateral low back pain with left-sided sciatica   3. Chronic pain syndrome   4. DDD (degenerative disc disease), lumbar   5. Polycystic kidney disease   6. Chronic, continuous use of opioids   7. Left sided sciatica   Based on our discussion today and upon review of the [facility] practitioner database information I am going to refill her medications for the next 2 months.  I am also going to refill her Flexeril to help with some of the muscle spasms that she has been experiencing.  I want her to continue exercise with stretching strengthening.  Will defer on any repeat epidural injections at this time and have encouraged her to continue follow-up with her primary care physicians and her nephrologist.  We will schedule her for 27-monthreturn to clinic.  Follow Up Instructions:  I discussed the assessment and treatment plan with the patient. The patient was provided an opportunity to ask questions and all were answered. The patient agreed with the plan and demonstrated an understanding of the instructions.   The patient was advised to call back or seek an in-person evaluation if the symptoms worsen or if the condition fails to improve as anticipated.  I provided 30 minutes of non-face-to-face time during this encounter.   Molli Barrows, MD

## 2019-05-23 ENCOUNTER — Encounter: Payer: Medicare Other | Admitting: Family Medicine

## 2019-06-17 ENCOUNTER — Other Ambulatory Visit: Payer: Self-pay | Admitting: Anesthesiology

## 2019-06-17 ENCOUNTER — Other Ambulatory Visit: Payer: Self-pay | Admitting: Family Medicine

## 2019-06-17 DIAGNOSIS — R030 Elevated blood-pressure reading, without diagnosis of hypertension: Secondary | ICD-10-CM

## 2019-06-21 DIAGNOSIS — R809 Proteinuria, unspecified: Secondary | ICD-10-CM | POA: Diagnosis not present

## 2019-06-21 DIAGNOSIS — I10 Essential (primary) hypertension: Secondary | ICD-10-CM | POA: Diagnosis not present

## 2019-06-21 DIAGNOSIS — Q612 Polycystic kidney, adult type: Secondary | ICD-10-CM | POA: Diagnosis not present

## 2019-06-21 DIAGNOSIS — N200 Calculus of kidney: Secondary | ICD-10-CM | POA: Diagnosis not present

## 2019-06-25 NOTE — Progress Notes (Signed)
Patient: Megan Brock, Female    DOB: 01/18/1981, 39 y.o.   MRN: [id number] Visit Date: 06/26/2019  Today's Provider: Lelon Huh, MD   Chief Complaint  Patient presents with  . Medicare Wellness  . Hypertension   Subjective:     Annual wellness visit Megan Brock is a 39 y.o. female. She feels well. She reports exercising 3 times weekly. She reports she is sleeping fairly well.  -----------------------------------------------------------  Hypertension, follow-up:  BP Readings from Last 3 Encounters:  06/26/19 132/84  12/07/18 136/84  11/22/18 132/68    She was last seen for hypertension more than1 year ago.  BP at that visit was 117/78. Management since that visit includes no changes; patient was counseled to stop smoking. She reports good compliance with treatment. She is not having side effects.  She is exercising. She is adherent to low salt diet.   Outside blood pressures are 140/80-90. She is experiencing none.  Patient denies chest pain, chest pressure/discomfort, claudication, dyspnea, exertional chest pressure/discomfort, fatigue, irregular heart beat, lower extremity edema, near-syncope, orthopnea, palpitations, paroxysmal nocturnal dyspnea, syncope and tachypnea.   Cardiovascular risk factors include hypertension.  Use of agents associated with hypertension: none.     Weight trend: increasing steadily Wt Readings from Last 3 Encounters:  06/26/19 185 lb (83.9 kg)  12/07/18 170 lb (77.1 kg)  11/22/18 174 lb (78.9 kg)    Current diet: well balanced  ------------------------------------------------------------------------   Review of Systems  Constitutional: Negative for appetite change, chills, fatigue and fever.  HENT: Negative for congestion, ear pain, rhinorrhea, sneezing and sore throat.   Eyes: Negative.  Negative for pain and redness.  Respiratory: Negative for cough, chest tightness, shortness of breath and wheezing.     Cardiovascular: Negative for chest pain, palpitations and leg swelling.  Gastrointestinal: Positive for abdominal pain. Negative for blood in stool, constipation, diarrhea, nausea and vomiting.  Endocrine: Positive for polydipsia and polyuria (due to diuretic). Negative for polyphagia.  Genitourinary: Positive for flank pain and urgency (due to diuretic). Negative for dysuria, hematuria, pelvic pain, vaginal bleeding and vaginal discharge.  Musculoskeletal: Positive for myalgias. Negative for arthralgias, back pain, gait problem and joint swelling.  Skin: Negative for rash.  Neurological: Negative.  Negative for dizziness, tremors, seizures, weakness, light-headedness, numbness and headaches.  Hematological: Negative for adenopathy.  Psychiatric/Behavioral: Negative.  Negative for behavioral problems, confusion and dysphoric mood. The patient is not nervous/anxious and is not hyperactive.     Social History   Socioeconomic History  . Marital status: Married    Spouse name: Not on file  . Number of children: 2  . Years of education: Not on file  . Highest education level: Some college, no degree  Occupational History  . Occupation: On disability  Tobacco Use  . Smoking status: Current Every Day Smoker    Packs/day: 0.50    Types: Cigarettes  . Smokeless tobacco: Never Used  . Tobacco comment: 1/2-1 pd since age 77  Substance and Sexual Activity  . Alcohol use: No    Alcohol/week: 0.0 standard drinks  . Drug use: No  . Sexual activity: Not on file  Other Topics Concern  . Not on file  Social History Narrative   Two children   Social Determinants of Health   Financial Resource Strain:   . Difficulty of Paying Living Expenses: Not on file  Food Insecurity:   . Worried About Charity fundraiser in the Last Year: Not on  file  . Clarissa in the Last Year: Not on file  Transportation Needs:   . Lack of Transportation (Medical): Not on file  . Lack of Transportation  (Non-Medical): Not on file  Physical Activity:   . Days of Exercise per Week: Not on file  . Minutes of Exercise per Session: Not on file  Stress:   . Feeling of Stress : Not on file  Social Connections:   . Frequency of Communication with Friends and Family: Not on file  . Frequency of Social Gatherings with Friends and Family: Not on file  . Attends Religious Services: Not on file  . Active Member of Clubs or Organizations: Not on file  . Attends Archivist Meetings: Not on file  . Marital Status: Not on file  Intimate Partner Violence:   . Fear of Current or Ex-Partner: Not on file  . Emotionally Abused: Not on file  . Physically Abused: Not on file  . Sexually Abused: Not on file    Past Medical History:  Diagnosis Date  . Hematuria   . History of chicken pox   . Hypertension   . Nephrolithiasis   . Polycystic kidney disease      Patient Active Problem List   Diagnosis Date Noted  . Tobacco abuse 01/06/2017  . Hypertension 12/08/2016  . Long term current use of opiate analgesic 10/11/2016  . Chronic pain syndrome 10/11/2016  . Lumbar spondylosis 10/11/2016  . Kidney stone 07/16/2015  . Anxiety 07/16/2015  . Hepatitis   . Chronic lower back pain 09/18/2014  . Chronic, continuous use of opioids 09/18/2014  . Polycystic kidney, autosomal dominant 08/03/2012  . Disorder of calcium metabolism 11/12/2011  . Malaise and fatigue 11/03/2011  . Gross hematuria 12/25/2007    Past Surgical History:  Procedure Laterality Date  . CESAREAN SECTION    . LITHOTRIPSY     has had 3 lithotripsy  . TUBAL LIGATION      Her family history includes Arthritis in her father and mother; Cancer in her father; Early death in her father; Heart attack in her father; Heart disease in her father; Hypertension in her brother, father, and mother; Kidney disease in her father.   Current Outpatient Medications:  .  amLODipine (NORVASC) 5 MG tablet, TAKE 1 TABLET(5 MG) BY MOUTH  DAILY, Disp: 90 tablet, Rfl: 1 .  cyclobenzaprine (FLEXERIL) 10 MG tablet, Take 1 tablet by mouth 2 (two) times daily as needed., Disp: , Rfl:  .  HYDROcodone-acetaminophen (NORCO) 10-325 MG tablet, Take 1 tablet by mouth 2 (two) times daily., Disp: 45 tablet, Rfl: 0 .  lisinopril (ZESTRIL) 10 MG tablet, TAKE 1 TABLET(10 MG) BY MOUTH DAILY, Disp: 90 tablet, Rfl: 1 .  morphine (MS CONTIN) 60 MG 12 hr tablet, Take 1 tablet (60 mg total) by mouth 3 (three) times daily., Disp: 75 tablet, Rfl: 0 .  naloxone (NARCAN) nasal spray 4 mg/0.1 mL, For excess sedation from opioids., Disp: 1 kit, Rfl: 2 .  Tolvaptan (JYNARQUE) 90 & 30 MG TBPK, Take 1 tablet by mouth 2 (two) times daily. Takes 60 mg in the AM and 30 mg 8 hours later. , Disp: , Rfl:   Patient Care Team: Birdie Sons, MD as PCP - General (Family Medicine) Molli Barrows, MD as Consulting Physician (Anesthesiology) Anthonette Legato, MD (Internal Medicine) Hollice Espy, MD as Consulting Physician (Urology)    Objective:    Vitals: BP 132/84 (BP Location: Left Arm, Patient Position:  Sitting, Cuff Size: Large)   Pulse (!) 55   Temp (!) 97.5 F (36.4 C) (Temporal)   Resp 16   Ht _0  (1.626 m)   Wt 185 lb (83.9 kg)   SpO2 99% Comment: room air  BMI 31.76 kg/m   Physical Exam   General Appearance:    Overweight female. Alert, cooperative, in no acute distress, appears stated age   Head:    Normocephalic, without obvious abnormality, atraumatic  Eyes:    PERRL, conjunctiva/corneas clear, EOM's intact, fundi    benign, both eyes  Ears:    Normal TM's and external ear canals, both ears  Nose:   Nares normal, septum midline, mucosa normal, no drainage    or sinus tenderness  Throat:   Lips, mucosa, and tongue normal; teeth and gums normal  Neck:   Supple, symmetrical, trachea midline, no adenopathy;    thyroid:  no enlargement/tenderness/nodules; no carotid   bruit or JVD  Back:     Symmetric, no curvature, ROM normal, no CVA  tenderness  Lungs:     Clear to auscultation bilaterally, respirations unlabored  Chest Wall:    No tenderness or deformity   Heart:    Bradycardic. Normal rhythm. No murmurs, rubs, or gallops.   Breast Exam:    normal appearance, no masses or tenderness  Abdomen:     Soft, non-tender, bowel sounds active all four quadrants,    no masses, no organomegaly  Pelvic:    deferred  Extremities:   All extremities are intact. No cyanosis or edema  Pulses:   2+ and symmetric all extremities  Skin:   Skin color, texture, turgor normal, no rashes or lesions  Lymph nodes:   Cervical, supraclavicular, and axillary nodes normal  Neurologic:   CNII-XII intact, normal strength, sensation and reflexes    throughout    Activities of Daily Living In your present state of health, do you have any difficulty performing the following activities: 06/26/2019  Hearing? N  Vision? N  Difficulty concentrating or making decisions? N  Walking or climbing stairs? N  Dressing or bathing? N  Doing errands, shopping? N  Some recent data might be hidden    Fall Risk Assessment Fall Risk  06/26/2019 12/07/2018 07/06/2018 06/05/2018 05/29/2018  Falls in the past year? 0 0 0 0 0  Number falls in past yr: 0 - 0 - -  Injury with Fall? 0 - 0 - -  Follow up Falls evaluation completed - - - -     Depression Screen PHQ 2/9 Scores 06/26/2019 12/07/2018 07/06/2018 04/21/2018  PHQ - 2 Score 0 0 0 0  PHQ- 9 Score 2 - - -    No flowsheet data found.    Assessment & Plan:     Annual Wellness Visit  Reviewed patient's Family Medical History Reviewed and updated list of patient's medical providers Assessment of cognitive impairment was done Assessed patient's functional ability Established a written schedule for health screening [place] Completed and Reviewed  Exercise Activities and Dietary recommendations Goals    . DIET - DECREASE SODA OR JUICE INTAKE     Continue cutting back on soda intake to  reach a goal of 1 soda a day.        Immunization History  Administered Date(s) Administered  . Influenza Split 02/23/2007, 01/05/2010  . Influenza,inj,Quad PF,6+ Mos 12/08/2016, 01/30/2018  . Tdap 12/10/2016    Health Maintenance  Topic Date Due  . HIV Screening  06/09/1997  . INFLUENZA VACCINE  11/18/2018  . PAP SMEAR-Modifier  01/07/2020  . TETANUS/TDAP  12/11/2026     Discussed health benefits of physical activity, and encouraged her to engage in regular exercise appropriate for her age and condition.    ------------------------------------------------------------------------------------------------------------ 1. Medicare annual wellness visit, subsequent   2. BMI 31.0-31.9,adult   3. Alopecia  - TSH - T4  4. Need for influenza vaccination  - Flu Vaccine QUAD 6+ mos PF IM (Fluarix Quad PF)  5. Hypertension secondary to other renal disorders Well controlled.  Continue current medications.  States she had labs drawn by her nephrologist recently.   Other orders - cyclobenzaprine (FLEXERIL) 10 MG tablet; Take 1 tablet by mouth 2 (two) times daily as needed.  The entirety of the information documented in the History of Present Illness, Review of Systems and Physical Exam were personally obtained by me. Portions of this information were initially documented by Meyer Cory, CMA and reviewed by me for thoroughness and accuracy.    Lelon Huh, MD  [facility]

## 2019-06-26 ENCOUNTER — Ambulatory Visit (INDEPENDENT_AMBULATORY_CARE_PROVIDER_SITE_OTHER): Payer: Medicare Other | Admitting: Family Medicine

## 2019-06-26 ENCOUNTER — Other Ambulatory Visit: Payer: Self-pay

## 2019-06-26 ENCOUNTER — Encounter: Payer: Self-pay | Admitting: Family Medicine

## 2019-06-26 VITALS — BP 132/84 | HR 55 | Temp 97.5°F | Resp 16 | Ht 64.0 in | Wt 185.0 lb

## 2019-06-26 DIAGNOSIS — N2889 Other specified disorders of kidney and ureter: Secondary | ICD-10-CM | POA: Diagnosis not present

## 2019-06-26 DIAGNOSIS — L659 Nonscarring hair loss, unspecified: Secondary | ICD-10-CM

## 2019-06-26 DIAGNOSIS — I151 Hypertension secondary to other renal disorders: Secondary | ICD-10-CM

## 2019-06-26 DIAGNOSIS — Z0000 Encounter for general adult medical examination without abnormal findings: Secondary | ICD-10-CM | POA: Diagnosis not present

## 2019-06-26 DIAGNOSIS — Z6831 Body mass index (BMI) 31.0-31.9, adult: Secondary | ICD-10-CM

## 2019-06-26 DIAGNOSIS — Z23 Encounter for immunization: Secondary | ICD-10-CM | POA: Diagnosis not present

## 2019-06-27 LAB — T4: T4, Total: 6.5 ug/dL (ref 4.5–12.0)

## 2019-06-27 LAB — TSH: TSH: 2.53 u[IU]/mL (ref 0.450–4.500)

## 2019-07-17 ENCOUNTER — Other Ambulatory Visit: Payer: Self-pay

## 2019-07-17 ENCOUNTER — Encounter: Payer: Self-pay | Admitting: Anesthesiology

## 2019-07-17 ENCOUNTER — Ambulatory Visit: Payer: Medicare Other | Attending: Anesthesiology | Admitting: Anesthesiology

## 2019-07-17 DIAGNOSIS — M5432 Sciatica, left side: Secondary | ICD-10-CM

## 2019-07-17 DIAGNOSIS — G894 Chronic pain syndrome: Secondary | ICD-10-CM

## 2019-07-17 DIAGNOSIS — M47816 Spondylosis without myelopathy or radiculopathy, lumbar region: Secondary | ICD-10-CM | POA: Diagnosis not present

## 2019-07-17 DIAGNOSIS — M5442 Lumbago with sciatica, left side: Secondary | ICD-10-CM | POA: Diagnosis not present

## 2019-07-17 DIAGNOSIS — Q613 Polycystic kidney, unspecified: Secondary | ICD-10-CM

## 2019-07-17 DIAGNOSIS — F1190 Opioid use, unspecified, uncomplicated: Secondary | ICD-10-CM | POA: Diagnosis not present

## 2019-07-17 DIAGNOSIS — M5136 Other intervertebral disc degeneration, lumbar region: Secondary | ICD-10-CM

## 2019-07-17 DIAGNOSIS — G8929 Other chronic pain: Secondary | ICD-10-CM

## 2019-07-17 MED ORDER — MORPHINE SULFATE ER 60 MG PO TBCR: 60.0000 mg | EXTENDED_RELEASE_TABLET | Freq: Three times a day (TID) | ORAL | 0 refills | Status: DC

## 2019-07-17 MED ORDER — HYDROCODONE-ACETAMINOPHEN 10-325 MG PO TABS: 1.0000 | ORAL_TABLET | Freq: Four times a day (QID) | ORAL | 0 refills | Status: AC | PRN

## 2019-07-17 MED ORDER — HYDROCODONE-ACETAMINOPHEN 10-325 MG PO TABS: 1.0000 | ORAL_TABLET | Freq: Four times a day (QID) | ORAL | 0 refills | Status: DC | PRN

## 2019-07-17 NOTE — Progress Notes (Signed)
Virtual Visit via Telephone Note  I connected with [name] on 07/17/19 at 11:45 AM EDT by telephone and verified that I am speaking with the correct person using two identifiers.  Location: Patient: Home Provider: Pain control center   I discussed the limitations, risks, security and privacy concerns of performing an evaluation and management service by telephone and the availability of in person appointments. I also discussed with the patient that there may be a patient responsible charge related to this service. The patient expressed understanding and agreed to proceed.   History of Present Illness: I spoke with Ms. Schabel via telephone today as she was unable to do the video portion of the Tour manager.  She reports that her back pain is slightly improved since her last evaluation.  She still having the low back pain similarly but it is less intense and the sciatica-like symptoms have improved somewhat.  She has been doing more stretching strengthening exercises and has been more active with ambulation.  She is taking her medications as prescribed but still needing the 3 times a day dosing every other day with the MS Contin.  She uses her hydrocodone for breakthrough pain fairly regularly.  She limits it to no more than 2 times per day and this combination works well for her.  It keeps her pain under control allows her to sleep and enables her to maintain an active productive lifestyle.  She has no side effects with the medications that she reports today.  Otherwise the quality characteristic and distribution of the pain are otherwise stable with no change.    Observations/Objective:  Current Outpatient Medications:  .  amLODipine (NORVASC) 5 MG tablet, TAKE 1 TABLET(5 MG) BY MOUTH DAILY, Disp: 90 tablet, Rfl: 1 .  cyclobenzaprine (FLEXERIL) 10 MG tablet, Take 1 tablet by mouth 2 (two) times daily as needed., Disp: , Rfl:  .  HYDROcodone-acetaminophen (NORCO) 10-325 MG tablet,  Take 1 tablet by mouth 2 (two) times daily., Disp: 45 tablet, Rfl: 0 .  lisinopril (ZESTRIL) 10 MG tablet, TAKE 1 TABLET(10 MG) BY MOUTH DAILY, Disp: 90 tablet, Rfl: 1 .  morphine (MS CONTIN) 60 MG 12 hr tablet, Take 1 tablet (60 mg total) by mouth 3 (three) times daily., Disp: 75 tablet, Rfl: 0 .  naloxone (NARCAN) nasal spray 4 mg/0.1 mL, For excess sedation from opioids., Disp: 1 kit, Rfl: 2 .  Tolvaptan (JYNARQUE) 90 & 30 MG TBPK, Take 1 tablet by mouth 2 (two) times daily. Takes 60 mg in the AM and 30 mg 8 hours later. , Disp: , Rfl:   Assessment and Plan: 1. Lumbar spondylosis   2. Chronic bilateral low back pain with left-sided sciatica   3. Chronic pain syndrome   4. Chronic, continuous use of opioids   5. Polycystic kidney disease   6. DDD (degenerative disc disease), lumbar   7. Left sided sciatica   I have reviewed the [facility] practitioner database information and it is appropriate.  She has been very stable on her current regimen and doing well with this with no evidence of any diverting or illicit use.  I will refill her medications for April 1 and May 1.  This will be for the hydrocodone and MS Contin.  I will schedule her for return to clinic in 2 months and have encouraged her to continue with exercises that we have spoken of.  We will defer on any repeat injections at this time as the sciatica seems to  be better and she is continue to take her cyclobenzaprine effectively to help with some of the muscle spasms.  I have encouraged her to continue follow-up with her primary care physicians.  I also talked to her at length about the Covid vaccine.  Follow Up Instructions:    I discussed the assessment and treatment plan with the patient. The patient was provided an opportunity to ask questions and all were answered. The patient agreed with the plan and demonstrated an understanding of the instructions.   The patient was advised to call back or seek an in-person evaluation if  the symptoms worsen or if the condition fails to improve as anticipated.  I provided 30 minutes of non-face-to-face time during this encounter.   Molli Barrows, MD

## 2019-08-11 DIAGNOSIS — F339 Major depressive disorder, recurrent, unspecified: Secondary | ICD-10-CM | POA: Diagnosis not present

## 2019-08-31 LAB — TOXASSURE SELECT 13 (MW), URINE

## 2019-09-12 ENCOUNTER — Other Ambulatory Visit: Payer: Self-pay

## 2019-09-12 ENCOUNTER — Encounter: Payer: Self-pay | Admitting: Anesthesiology

## 2019-09-12 ENCOUNTER — Ambulatory Visit: Payer: Medicare Other | Attending: Anesthesiology | Admitting: Anesthesiology

## 2019-09-12 DIAGNOSIS — G894 Chronic pain syndrome: Secondary | ICD-10-CM

## 2019-09-12 DIAGNOSIS — M47816 Spondylosis without myelopathy or radiculopathy, lumbar region: Secondary | ICD-10-CM

## 2019-09-12 DIAGNOSIS — F1190 Opioid use, unspecified, uncomplicated: Secondary | ICD-10-CM | POA: Diagnosis not present

## 2019-09-12 DIAGNOSIS — Q613 Polycystic kidney, unspecified: Secondary | ICD-10-CM

## 2019-09-12 DIAGNOSIS — M5432 Sciatica, left side: Secondary | ICD-10-CM

## 2019-09-12 DIAGNOSIS — M5442 Lumbago with sciatica, left side: Secondary | ICD-10-CM | POA: Diagnosis not present

## 2019-09-12 DIAGNOSIS — G8929 Other chronic pain: Secondary | ICD-10-CM

## 2019-09-12 DIAGNOSIS — M5136 Other intervertebral disc degeneration, lumbar region: Secondary | ICD-10-CM

## 2019-09-12 MED ORDER — MORPHINE SULFATE ER 60 MG PO TBCR: 60.0000 mg | EXTENDED_RELEASE_TABLET | Freq: Three times a day (TID) | ORAL | 0 refills | Status: DC

## 2019-09-12 MED ORDER — HYDROCODONE-ACETAMINOPHEN 10-325 MG PO TABS: 1.0000 | ORAL_TABLET | Freq: Two times a day (BID) | ORAL | 0 refills | Status: DC | PRN

## 2019-09-12 MED ORDER — HYDROCODONE-ACETAMINOPHEN 10-325 MG PO TABS: 1.0000 | ORAL_TABLET | Freq: Two times a day (BID) | ORAL | 0 refills | Status: AC | PRN

## 2019-09-12 NOTE — Progress Notes (Signed)
Virtual Visit via Telephone Note  I connected with Megan Brock on 09/12/19 at  1:15 PM EDT by telephone and verified that I am speaking with the correct person using two identifiers.  Location: Patient: Home Provider: Pain control center   I discussed the limitations, risks, security and privacy concerns of performing an evaluation and management service by telephone and the availability of in person appointments. I also discussed with the patient that there may be a patient responsible charge related to this service. The patient expressed understanding and agreed to proceed.   History of Present Illness: I spoke with Megan Brock via telephone today she was unable to do the video portion of the virtual conference which she reports that she has been doing well this past 2 months with her medication regimen.  She is taking her medications as prescribed and having no problems with them.  She is also getting good pain relief in the low back and does not feel that she needs another epidural at this time.  She has gotten good relief with the epidural steroids for her sciatica.  She is doing her stretching strengthening exercises and staying on top of things with that and it is working well.  Overall she reports good relief with the medications with no diverting or illicit activity ever reported and she continues to derive good functional lifestyle improvement with them as well.  She continues to follow-up with her primary care physicians and nephrologist as well for her polycystic kidney disease.    Observations/Objective:  Current Outpatient Medications:  .  amLODipine (NORVASC) 5 MG tablet, TAKE 1 TABLET(5 MG) BY MOUTH DAILY, Disp: 90 tablet, Rfl: 1 .  cyclobenzaprine (FLEXERIL) 10 MG tablet, Take 1 tablet by mouth 2 (two) times daily as needed., Disp: , Rfl:  .  HYDROcodone-acetaminophen (NORCO) 10-325 MG tablet, Take 1 tablet by mouth every 6 (six) hours as needed for severe pain., Disp: 45 tablet,  Rfl: 0 .  lisinopril (ZESTRIL) 10 MG tablet, TAKE 1 TABLET(10 MG) BY MOUTH DAILY, Disp: 90 tablet, Rfl: 1 .  morphine (MS CONTIN) 60 MG 12 hr tablet, Take 1 tablet (60 mg total) by mouth 3 (three) times daily., Disp: 75 tablet, Rfl: 0 .  naloxone (NARCAN) nasal spray 4 mg/0.1 mL, For excess sedation from opioids., Disp: 1 kit, Rfl: 2 .  Tolvaptan (JYNARQUE) 90 & 30 MG TBPK, Take 1 tablet by mouth 2 (two) times daily. Takes 60 mg in the AM and 30 mg 8 hours later. , Disp: , Rfl:   Assessment and Plan: 1. Lumbar spondylosis   2. Chronic bilateral low back pain with left-sided sciatica   3. Chronic pain syndrome   4. Chronic, continuous use of opioids   5. Polycystic kidney disease   6. DDD (degenerative disc disease), lumbar   7. Left sided sciatica   Based on our discussion today and upon review of the [facility] practitioner database information going to refill her medicines for the next 2 months.  This will be for June and July with a return to clinic in 2 months.  Continue with core stretching strengthening exercises and we will defer on any repeat injections at this time.  Continue follow-up with her primary care physicians and nephrologist for her polycystic kidney disease and she is instructed to contact us at the pain control center should she have any problems with pain management or medication management in the next 2 months. Follow Up Instructions:    I discussed the  assessment and treatment plan with the patient. The patient was provided an opportunity to ask questions and all were answered. The patient agreed with the plan and demonstrated an understanding of the instructions.   The patient was advised to call back or seek an in-person evaluation if the symptoms worsen or if the condition fails to improve as anticipated.  I provided 25 minutes of non-face-to-face time during this encounter.   Molli Barrows, MD

## 2019-09-26 DIAGNOSIS — I10 Essential (primary) hypertension: Secondary | ICD-10-CM | POA: Diagnosis not present

## 2019-09-26 DIAGNOSIS — Q612 Polycystic kidney, adult type: Secondary | ICD-10-CM | POA: Diagnosis not present

## 2019-09-26 DIAGNOSIS — Z79899 Other long term (current) drug therapy: Secondary | ICD-10-CM | POA: Diagnosis not present

## 2019-10-01 DIAGNOSIS — I10 Essential (primary) hypertension: Secondary | ICD-10-CM | POA: Diagnosis not present

## 2019-10-01 DIAGNOSIS — Q612 Polycystic kidney, adult type: Secondary | ICD-10-CM | POA: Diagnosis not present

## 2019-10-01 DIAGNOSIS — N200 Calculus of kidney: Secondary | ICD-10-CM | POA: Diagnosis not present

## 2019-10-01 DIAGNOSIS — R809 Proteinuria, unspecified: Secondary | ICD-10-CM | POA: Diagnosis not present

## 2019-10-15 ENCOUNTER — Other Ambulatory Visit: Payer: Self-pay | Admitting: Anesthesiology

## 2019-11-03 DIAGNOSIS — F339 Major depressive disorder, recurrent, unspecified: Secondary | ICD-10-CM | POA: Diagnosis not present

## 2019-11-13 ENCOUNTER — Encounter: Payer: Self-pay | Admitting: Anesthesiology

## 2019-11-13 ENCOUNTER — Other Ambulatory Visit: Payer: Self-pay

## 2019-11-13 ENCOUNTER — Ambulatory Visit: Payer: Medicare Other | Attending: Anesthesiology | Admitting: Anesthesiology

## 2019-11-13 DIAGNOSIS — M5442 Lumbago with sciatica, left side: Secondary | ICD-10-CM | POA: Insufficient documentation

## 2019-11-13 DIAGNOSIS — F1190 Opioid use, unspecified, uncomplicated: Secondary | ICD-10-CM | POA: Diagnosis present

## 2019-11-13 DIAGNOSIS — G894 Chronic pain syndrome: Secondary | ICD-10-CM | POA: Diagnosis present

## 2019-11-13 DIAGNOSIS — M47816 Spondylosis without myelopathy or radiculopathy, lumbar region: Secondary | ICD-10-CM | POA: Insufficient documentation

## 2019-11-13 DIAGNOSIS — M5136 Other intervertebral disc degeneration, lumbar region: Secondary | ICD-10-CM | POA: Diagnosis present

## 2019-11-13 DIAGNOSIS — M5431 Sciatica, right side: Secondary | ICD-10-CM | POA: Diagnosis present

## 2019-11-13 DIAGNOSIS — G8929 Other chronic pain: Secondary | ICD-10-CM | POA: Diagnosis present

## 2019-11-13 HISTORY — DX: Sciatica, right side: M54.31

## 2019-11-13 MED ORDER — MORPHINE SULFATE ER 60 MG PO TBCR: 60.0000 mg | EXTENDED_RELEASE_TABLET | Freq: Three times a day (TID) | ORAL | 0 refills | Status: DC

## 2019-11-13 MED ORDER — HYDROCODONE-ACETAMINOPHEN 10-325 MG PO TABS: 1.0000 | ORAL_TABLET | Freq: Two times a day (BID) | ORAL | 0 refills | Status: AC | PRN

## 2019-11-13 MED ORDER — HYDROCODONE-ACETAMINOPHEN 10-325 MG PO TABS: 1.0000 | ORAL_TABLET | Freq: Two times a day (BID) | ORAL | 0 refills | Status: DC | PRN

## 2019-11-13 NOTE — Progress Notes (Signed)
Virtual Visit via Telephone Note  I connected with [name] on 11/13/19 at  1:45 PM EDT by telephone and verified that I am speaking with the correct person using two identifiers.  Location: Patient: Home Provider: Pain control center   I discussed the limitations, risks, security and privacy concerns of performing an evaluation and management service by telephone and the availability of in person appointments. I also discussed with the patient that there may be a patient responsible charge related to this service. The patient expressed understanding and agreed to proceed.   History of Present Illness: I spoke with Megan Brock via telephone as we were unable to link up for the video portion of the virtual conference.  She reports that she is doing reasonably well but having some more right side lower leg pain and calf cramping.  She had this a few years back and did very well with the epidural.  Her most recent epidural took care of most of her left side lower leg pain and this continues to do well.  Her strength is at baseline.  No new troubles with bowel or bladder function though she does have chronic polycystic kidney disease for which she is followed by her nephrologist.  She is taking her medications as prescribed and these continue to work well for her.  She takes her MS Contin approximately 2-3 times a day with hydrocodone if needed or alternates them accordingly.  This regimen has worked well for her and she denies any side effects or difficulties with it.  Unfortunately her low back pain and chronic flank pain has been recalcitrant to more conservative therapy.    Observations/Objective:  Current Outpatient Medications:  .  amLODipine (NORVASC) 5 MG tablet, TAKE 1 TABLET(5 MG) BY MOUTH DAILY, Disp: 90 tablet, Rfl: 1 .  cyclobenzaprine (FLEXERIL) 10 MG tablet, TAKE 1 TABLET(10 MG) BY MOUTH TWICE DAILY, Disp: 60 tablet, Rfl: 3 .  [START ON 11/15/2019] HYDROcodone-acetaminophen (NORCO)  10-325 MG tablet, Take 1 tablet by mouth 2 (two) times daily as needed for moderate pain or severe pain., Disp: 45 tablet, Rfl: 0 .  [START ON 12/16/2019] HYDROcodone-acetaminophen (NORCO) 10-325 MG tablet, Take 1 tablet by mouth 2 (two) times daily as needed for moderate pain or severe pain., Disp: 45 tablet, Rfl: 0 .  lisinopril (ZESTRIL) 10 MG tablet, TAKE 1 TABLET(10 MG) BY MOUTH DAILY, Disp: 90 tablet, Rfl: 1 .  [START ON 11/15/2019] morphine (MS CONTIN) 60 MG 12 hr tablet, Take 1 tablet (60 mg total) by mouth 3 (three) times daily., Disp: 75 tablet, Rfl: 0 .  [START ON 12/15/2019] morphine (MS CONTIN) 60 MG 12 hr tablet, Take 1 tablet (60 mg total) by mouth 3 (three) times daily., Disp: 75 tablet, Rfl: 0 .  naloxone (NARCAN) nasal spray 4 mg/0.1 mL, For excess sedation from opioids., Disp: 1 kit, Rfl: 2 .  Tolvaptan (JYNARQUE) 90 & 30 MG TBPK, Take 1 tablet by mouth 2 (two) times daily. Takes 60 mg in the AM and 30 mg 8 hours later. , Disp: , Rfl:   Assessment and Plan: 1. Lumbar spondylosis   2. Chronic bilateral low back pain with left-sided sciatica   3. Chronic pain syndrome   4. Chronic, continuous use of opioids   5. Right sided sciatica   6. DDD (degenerative disc disease), lumbar   Based on our discussion today and upon review of the [facility] practitioner database information going to refill her medications for the next 2 months  dated for July 29 and August 28.  I want her to continue with her current pain regimen.  No changes in her protocol will be initiated.  She can also take the Flexeril for muscle spasms.  Continue with exercises as discussed.  We will schedule her for return to clinic in 2 months to see if we can help with some of the right side leg and sciatica symptoms with an epidural steroid as requested per patient.  She is to continue follow-up with her primary care physicians for her baseline medical care.  Follow Up Instructions:    I discussed the assessment and  treatment plan with the patient. The patient was provided an opportunity to ask questions and all were answered. The patient agreed with the plan and demonstrated an understanding of the instructions.   The patient was advised to call back or seek an in-person evaluation if the symptoms worsen or if the condition fails to improve as anticipated.  I provided 30 minutes of non-face-to-face time during this encounter.   Molli Barrows, MD

## 2019-12-12 ENCOUNTER — Other Ambulatory Visit: Payer: Self-pay | Admitting: Family Medicine

## 2019-12-12 DIAGNOSIS — R030 Elevated blood-pressure reading, without diagnosis of hypertension: Secondary | ICD-10-CM

## 2019-12-12 NOTE — Telephone Encounter (Signed)
Requested Prescriptions  Pending Prescriptions Disp Refills   amLODipine (NORVASC) 5 MG tablet [Pharmacy Med Name: AMLODIPINE BESYLATE 5MG  TABLETS] 90 tablet 0    Sig: TAKE 1 TABLET(5 MG) BY MOUTH DAILY     Cardiovascular:  Calcium Channel Blockers Passed - 12/12/2019  3:35 AM      Passed - Last BP in normal range    BP Readings from Last 1 Encounters:  06/26/19 132/84         Passed - Valid encounter within last 6 months    Recent Outpatient Visits          5 months ago Medicare annual wellness visit, subsequent   [facility], [place]   2 years ago Hypertension secondary to other renal disorders   [facility], MD   2 years ago Acute otitis media, unspecified otitis media type   [facility], MD   2 years ago Hypertension secondary to other renal disorders   [facility], MD   2 years ago Hypertension secondary to other renal disorders   [facility], MD      Future Appointments            In 11 months Malva Limes, MD [facility]           Reminder for patient to schedule 6 month follow-up appt.

## 2019-12-13 ENCOUNTER — Ambulatory Visit: Payer: Medicare Other | Admitting: Anesthesiology

## 2019-12-14 ENCOUNTER — Other Ambulatory Visit: Payer: Self-pay | Admitting: Family Medicine

## 2020-01-10 ENCOUNTER — Ambulatory Visit (HOSPITAL_BASED_OUTPATIENT_CLINIC_OR_DEPARTMENT_OTHER): Payer: Medicare Other | Admitting: Anesthesiology

## 2020-01-10 ENCOUNTER — Other Ambulatory Visit: Payer: Self-pay

## 2020-01-10 ENCOUNTER — Other Ambulatory Visit: Payer: Self-pay | Admitting: Anesthesiology

## 2020-01-10 ENCOUNTER — Encounter: Payer: Self-pay | Admitting: Anesthesiology

## 2020-01-10 ENCOUNTER — Ambulatory Visit
Admission: RE | Admit: 2020-01-10 | Discharge: 2020-01-10 | Disposition: A | Discharge status: Home / Self Care | Payer: Medicare Other | Source: Ambulatory Visit | Attending: Anesthesiology | Admitting: Anesthesiology

## 2020-01-10 VITALS — BP 126/81 | HR 97 | Temp 99.0°F | Resp 18 | Ht 64.0 in | Wt 170.0 lb

## 2020-01-10 DIAGNOSIS — M5431 Sciatica, right side: Secondary | ICD-10-CM | POA: Diagnosis present

## 2020-01-10 DIAGNOSIS — R52 Pain, unspecified: Secondary | ICD-10-CM | POA: Diagnosis present

## 2020-01-10 DIAGNOSIS — F1190 Opioid use, unspecified, uncomplicated: Secondary | ICD-10-CM | POA: Diagnosis present

## 2020-01-10 DIAGNOSIS — M47816 Spondylosis without myelopathy or radiculopathy, lumbar region: Secondary | ICD-10-CM

## 2020-01-10 DIAGNOSIS — M5442 Lumbago with sciatica, left side: Secondary | ICD-10-CM | POA: Insufficient documentation

## 2020-01-10 DIAGNOSIS — G894 Chronic pain syndrome: Secondary | ICD-10-CM | POA: Diagnosis present

## 2020-01-10 DIAGNOSIS — G8929 Other chronic pain: Secondary | ICD-10-CM

## 2020-01-10 DIAGNOSIS — Q613 Polycystic kidney, unspecified: Secondary | ICD-10-CM | POA: Diagnosis present

## 2020-01-10 DIAGNOSIS — M5136 Other intervertebral disc degeneration, lumbar region: Secondary | ICD-10-CM | POA: Diagnosis present

## 2020-01-10 MED ORDER — HYDROCODONE-ACETAMINOPHEN 10-325 MG PO TABS: 1.0000 | ORAL_TABLET | Freq: Two times a day (BID) | ORAL | 0 refills | Status: DC | PRN

## 2020-01-10 MED ORDER — SODIUM CHLORIDE 0.9% FLUSH
10.0000 mL | Freq: Once | INTRAVENOUS | Status: AC
  Administered 2020-01-10: 10 mL

## 2020-01-10 MED ORDER — IOHEXOL 180 MG/ML  SOLN
10.0000 mL | Freq: Once | INTRAMUSCULAR | Status: AC | PRN
  Administered 2020-01-10: 10 mL via EPIDURAL

## 2020-01-10 MED ORDER — ROPIVACAINE HCL 2 MG/ML IJ SOLN
INTRAMUSCULAR | Status: AC
  Filled 2020-01-10: qty 10

## 2020-01-10 MED ORDER — HYDROCODONE-ACETAMINOPHEN 10-325 MG PO TABS
1.0000 | ORAL_TABLET | Freq: Two times a day (BID) | ORAL | 0 refills | Status: AC | PRN
Start: 2020-01-14 — End: 2020-02-13

## 2020-01-10 MED ORDER — MORPHINE SULFATE ER 60 MG PO TBCR: 60.0000 mg | EXTENDED_RELEASE_TABLET | Freq: Three times a day (TID) | ORAL | 0 refills | Status: DC

## 2020-01-10 MED ORDER — ROPIVACAINE HCL 2 MG/ML IJ SOLN
10.0000 mL | Freq: Once | INTRAMUSCULAR | Status: AC
  Administered 2020-01-10: 10 mL via EPIDURAL

## 2020-01-10 MED ORDER — TRIAMCINOLONE ACETONIDE 40 MG/ML IJ SUSP
40.0000 mg | Freq: Once | INTRAMUSCULAR | Status: AC
  Administered 2020-01-10: 40 mg

## 2020-01-10 MED ORDER — LIDOCAINE HCL (PF) 1 % IJ SOLN
INTRAMUSCULAR | Status: AC
  Filled 2020-01-10: qty 5

## 2020-01-10 MED ORDER — TRIAMCINOLONE ACETONIDE 40 MG/ML IJ SUSP
INTRAMUSCULAR | Status: AC
  Filled 2020-01-10: qty 1

## 2020-01-10 MED ORDER — LIDOCAINE HCL (PF) 1 % IJ SOLN
5.0000 mL | Freq: Once | INTRAMUSCULAR | Status: AC
  Administered 2020-01-10: 5 mL via SUBCUTANEOUS

## 2020-01-10 MED ORDER — SODIUM CHLORIDE (PF) 0.9 % IJ SOLN
INTRAMUSCULAR | Status: AC
  Filled 2020-01-10: qty 10

## 2020-01-10 NOTE — Progress Notes (Signed)
Nursing Pain Medication Assessment:  Safety precautions to be maintained throughout the outpatient stay will include: orient to surroundings, keep bed in low position, maintain call bell within reach at all times, provide assistance with transfer out of bed and ambulation.  Medication Inspection Compliance: Pill count conducted under aseptic conditions, in front of the patient. Neither the pills nor the bottle was removed from the patient's sight at any time. Once count was completed pills were immediately returned to the patient in their original bottle.  Medication: Hydrocodone/APAP Pill/Patch Count: 7 of 45 pills remain Pill/Patch Appearance: Markings consistent with prescribed medication Bottle Appearance: Standard pharmacy container. Clearly labeled. Filled Date: 08/ 28 / 2021 Last Medication intake:  Today   Morphine 60 mg 11/75 Filled 12/13/2019 Today

## 2020-01-10 NOTE — Progress Notes (Signed)
Subjective:  Patient ID: Megan Brock, female    DOB: 1980/10/27  Age: 39 y.o. MRN: [id number]  CC: Back Pain   Procedure: L5-S1 epidural steroid under fluoroscopic guidance without sedation  HPI Megan Brock presents for reevaluation.  She continues to have low back pain with right side greater than left sciatica symptoms.  In the past she has had epidural steroids with good relief from the steroid injection.  New changes in lower extremity strength or function or bowel or bladder function are noted otherwise.  She generally gets about 75 to 80% relief lasting 2 to 3 months.  Her last injection was in 2020.  She continues to remain active trying to do her physical therapy.  She is reliant on her opioid medications for pain control which has been a chronic issue for her.  Unfortunately she has failed more conservative therapy however she is continue to get good relief with the medications and no side effects are reported.  She continues to follow-up with her nephrologist for her polycystic kidney disease as well which is a chronic source of her back pain as well.  Outpatient Medications Prior to Visit  Medication Sig Dispense Refill  . amLODipine (NORVASC) 5 MG tablet TAKE 1 TABLET(5 MG) BY MOUTH DAILY 90 tablet 0  . cyclobenzaprine (FLEXERIL) 10 MG tablet TAKE 1 TABLET(10 MG) BY MOUTH TWICE DAILY 60 tablet 3  . lisinopril (ZESTRIL) 10 MG tablet TAKE 1 TABLET(10 MG) BY MOUTH DAILY 90 tablet 1  . naloxone (NARCAN) nasal spray 4 mg/0.1 mL For excess sedation from opioids. 1 kit 2  . Tolvaptan (JYNARQUE) 90 & 30 MG TBPK Take 1 tablet by mouth 2 (two) times daily. Takes 60 mg in the AM and 30 mg 8 hours later.     Marland Kitchen HYDROcodone-acetaminophen (NORCO) 10-325 MG tablet Take 1 tablet by mouth 2 (two) times daily as needed for moderate pain or severe pain. 45 tablet 0  . morphine (MS CONTIN) 60 MG 12 hr tablet Take 1 tablet (60 mg total) by mouth 3 (three) times daily. 75 tablet 0   No  facility-administered medications prior to visit.    Review of Systems CNS: No confusion or sedation Cardiac: No angina or palpitations GI: No abdominal pain or constipation Constitutional: No nausea vomiting fevers or chills  Objective:  BP 126/81   Pulse 97   Temp 99 F (37.2 C)   Resp 18   Ht _0  (1.626 m)   Wt 170 lb (77.1 kg)   LMP 12/31/2019   SpO2 100%   BMI 29.18 kg/m    BP Readings from Last 3 Encounters:  01/10/20 126/81  06/26/19 132/84  12/07/18 136/84     Wt Readings from Last 3 Encounters:  01/10/20 170 lb (77.1 kg)  06/26/19 185 lb (83.9 kg)  12/07/18 170 lb (77.1 kg)     Physical Exam Pt is alert and oriented PERRL EOMI HEART IS RRR no murmur or rub LCTA no wheezing or rales MUSCULOSKELETAL reveals some paraspinous muscle tenderness but no overt trigger points.  She has a positive straight leg raise on the right side negative on the left with a slightly antalgic gait but she seems to be ambulating well and has retained good muscle tone and bulk to the lower extremities.  Labs  No results found for: HGBA1C Lab Results  Component Value Date   LDLCALC 105 (H) 07/17/2015   CREATININE 0.98 02/08/2017    -------------------------------------------------------------------------------------------------------------------- Lab Results  Component  Value Date   WBC 9.2 02/08/2017   HGB 13.2 02/08/2017   HCT 37.7 02/08/2017   PLT 197 02/08/2017   GLUCOSE 87 02/08/2017   CHOL 174 07/17/2015   TRIG 139 07/17/2015   HDL 41 07/17/2015   LDLCALC 105 (H) 07/17/2015   ALT 10 02/08/2017   AST 12 02/08/2017   NA 140 02/08/2017   K 4.0 02/08/2017   CL 105 02/08/2017   CREATININE 0.98 02/08/2017   BUN 18 02/08/2017   CO2 29 02/08/2017   TSH 2.530 06/26/2019    --------------------------------------------------------------------------------------------------------------------- No results found.   Assessment & Plan:   Megan Brock was seen today for  back pain.  Diagnoses and all orders for this visit:  Lumbar spondylosis  Chronic bilateral low back pain with left-sided sciatica  Chronic pain syndrome  Chronic, continuous use of opioids  Right sided sciatica  DDD (degenerative disc disease), lumbar  Polycystic kidney disease  Other orders -     HYDROcodone-acetaminophen (NORCO) 10-325 MG tablet; Take 1 tablet by mouth 2 (two) times daily as needed for moderate pain or severe pain. -     HYDROcodone-acetaminophen (NORCO) 10-325 MG tablet; Take 1 tablet by mouth 2 (two) times daily as needed for moderate pain or severe pain. -     morphine (MS CONTIN) 60 MG 12 hr tablet; Take 1 tablet (60 mg total) by mouth 3 (three) times daily. -     morphine (MS CONTIN) 60 MG 12 hr tablet; Take 1 tablet (60 mg total) by mouth 3 (three) times daily. -     triamcinolone acetonide (KENALOG-40) injection 40 mg -     sodium chloride flush (NS) 0.9 % injection 10 mL -     ropivacaine (PF) 2 mg/mL (0.2%) (NAROPIN) injection 10 mL -     lidocaine (PF) (XYLOCAINE) 1 % injection 5 mL -     iohexol (OMNIPAQUE) 180 MG/ML injection 10 mL        ----------------------------------------------------------------------------------------------------------------------  Problem List Items Addressed This Visit      Unprioritized   Chronic lower back pain   Relevant Medications   HYDROcodone-acetaminophen (NORCO) 10-325 MG tablet (Start on 01/14/2020)   HYDROcodone-acetaminophen (NORCO) 10-325 MG tablet (Start on 02/13/2020)   morphine (MS CONTIN) 60 MG 12 hr tablet (Start on 01/14/2020)   morphine (MS CONTIN) 60 MG 12 hr tablet (Start on 02/13/2020)   triamcinolone acetonide (KENALOG-40) injection 40 mg (Start on 01/10/2020  9:45 AM)   Chronic pain syndrome (Chronic)   Relevant Medications   HYDROcodone-acetaminophen (NORCO) 10-325 MG tablet (Start on 01/14/2020)   HYDROcodone-acetaminophen (NORCO) 10-325 MG tablet (Start on 02/13/2020)   morphine (MS  CONTIN) 60 MG 12 hr tablet (Start on 01/14/2020)   morphine (MS CONTIN) 60 MG 12 hr tablet (Start on 02/13/2020)   triamcinolone acetonide (KENALOG-40) injection 40 mg (Start on 01/10/2020  9:45 AM)   ropivacaine (PF) 2 mg/mL (0.2%) (NAROPIN) injection 10 mL (Start on 01/10/2020  9:45 AM)   lidocaine (PF) (XYLOCAINE) 1 % injection 5 mL (Start on 01/10/2020  9:45 AM)   Chronic, continuous use of opioids   Lumbar spondylosis - Primary (Chronic)   Relevant Medications   HYDROcodone-acetaminophen (NORCO) 10-325 MG tablet (Start on 01/14/2020)   HYDROcodone-acetaminophen (NORCO) 10-325 MG tablet (Start on 02/13/2020)   morphine (MS CONTIN) 60 MG 12 hr tablet (Start on 01/14/2020)   morphine (MS CONTIN) 60 MG 12 hr tablet (Start on 02/13/2020)   triamcinolone acetonide (KENALOG-40) injection 40 mg (Start on 01/10/2020  9:45 AM)   Right sided sciatica    Other Visit Diagnoses    DDD (degenerative disc disease), lumbar       Relevant Medications   HYDROcodone-acetaminophen (NORCO) 10-325 MG tablet (Start on 01/14/2020)   HYDROcodone-acetaminophen (NORCO) 10-325 MG tablet (Start on 02/13/2020)   morphine (MS CONTIN) 60 MG 12 hr tablet (Start on 01/14/2020)   morphine (MS CONTIN) 60 MG 12 hr tablet (Start on 02/13/2020)   triamcinolone acetonide (KENALOG-40) injection 40 mg (Start on 01/10/2020  9:45 AM)   Polycystic kidney disease            ----------------------------------------------------------------------------------------------------------------------  1. Lumbar spondylosis We will proceed with a repeat epidural today for her sciatica symptoms.  The risks and benefits of been reviewed all questions answered and no guarantees are made.  I will have her return to clinic in 2 months for review and I want her to continue with her stretching strengthening exercises as tolerated.  2. Chronic bilateral low back pain with left-sided sciatica As above  3. Chronic pain syndrome I have reviewed the  [facility] practitioner database information and it is appropriate for refills dated for September 27 and October 27.  No other changes in her pain management protocol will be initiated today.  4. Chronic, continuous use of opioids As above  5. Right sided sciatica As above  6. DDD (degenerative disc disease), lumbar As above  7. Polycystic kidney disease Continue follow-up with her nephrologist and primary care physicians for baseline medical care    ----------------------------------------------------------------------------------------------------------------------  I am having Megan Brock. Megan Brock start on HYDROcodone-acetaminophen and morphine. I am also having her maintain her naloxone, Tolvaptan, cyclobenzaprine, amLODipine, lisinopril, HYDROcodone-acetaminophen, and morphine.   Meds ordered this encounter  Medications  . HYDROcodone-acetaminophen (NORCO) 10-325 MG tablet    Sig: Take 1 tablet by mouth 2 (two) times daily as needed for moderate pain or severe pain.    Dispense:  45 tablet    Refill:  0  . HYDROcodone-acetaminophen (NORCO) 10-325 MG tablet    Sig: Take 1 tablet by mouth 2 (two) times daily as needed for moderate pain or severe pain.    Dispense:  45 tablet    Refill:  0  . morphine (MS CONTIN) 60 MG 12 hr tablet    Sig: Take 1 tablet (60 mg total) by mouth 3 (three) times daily.    Dispense:  75 tablet    Refill:  0  . morphine (MS CONTIN) 60 MG 12 hr tablet    Sig: Take 1 tablet (60 mg total) by mouth 3 (three) times daily.    Dispense:  75 tablet    Refill:  0  . triamcinolone acetonide (KENALOG-40) injection 40 mg  . sodium chloride flush (NS) 0.9 % injection 10 mL  . ropivacaine (PF) 2 mg/mL (0.2%) (NAROPIN) injection 10 mL  . lidocaine (PF) (XYLOCAINE) 1 % injection 5 mL  . iohexol (OMNIPAQUE) 180 MG/ML injection 10 mL   Patient's Medications  New Prescriptions   HYDROCODONE-ACETAMINOPHEN (NORCO) 10-325 MG TABLET    Take 1 tablet by mouth 2  (two) times daily as needed for moderate pain or severe pain.   MORPHINE (MS CONTIN) 60 MG 12 HR TABLET    Take 1 tablet (60 mg total) by mouth 3 (three) times daily.  Previous Medications   AMLODIPINE (NORVASC) 5 MG TABLET    TAKE 1 TABLET(5 MG) BY MOUTH DAILY   CYCLOBENZAPRINE (FLEXERIL) 10 MG TABLET    TAKE 1  TABLET(10 MG) BY MOUTH TWICE DAILY   LISINOPRIL (ZESTRIL) 10 MG TABLET    TAKE 1 TABLET(10 MG) BY MOUTH DAILY   NALOXONE (NARCAN) NASAL SPRAY 4 MG/0.1 ML    For excess sedation from opioids.   TOLVAPTAN (JYNARQUE) 90 & 30 MG TBPK    Take 1 tablet by mouth 2 (two) times daily. Takes 60 mg in the AM and 30 mg 8 hours later.   Modified Medications   Modified Medication Previous Medication   HYDROCODONE-ACETAMINOPHEN (NORCO) 10-325 MG TABLET HYDROcodone-acetaminophen (NORCO) 10-325 MG tablet      Take 1 tablet by mouth 2 (two) times daily as needed for moderate pain or severe pain.    Take 1 tablet by mouth 2 (two) times daily as needed for moderate pain or severe pain.   MORPHINE (MS CONTIN) 60 MG 12 HR TABLET morphine (MS CONTIN) 60 MG 12 hr tablet      Take 1 tablet (60 mg total) by mouth 3 (three) times daily.    Take 1 tablet (60 mg total) by mouth 3 (three) times daily.  Discontinued Medications   No medications on file   ----------------------------------------------------------------------------------------------------------------------  Follow-up: Return in about 2 months (around 03/11/2020) for evaluation, med refill.  . Procedure: L5-S1 LESI with fluoroscopic guidance and no moderate sedation  NOTE: The risks, benefits, and expectations of the procedure have been discussed and explained to the patient who was understanding and in agreement with suggested treatment plan. No guarantees were made.  DESCRIPTION OF PROCEDURE: Lumbar epidural steroid injection with no IV Versed, EKG, blood pressure, pulse, and pulse oximetry monitoring. The procedure was performed with the patient  in the prone position under fluoroscopic guidance.  Sterile prep x3 was initiated and I then injected subcutaneous lidocaine to the overlying L5-S1 site after its fluoroscopic identifictation.  Using strict aseptic technique, I then advanced an 18-gauge Tuohy epidural needle in the midline using interlaminar approach via loss-of-resistance to saline technique. There was negative aspiration for heme or  CSF.  I then confirmed position with both AP and Lateral fluoroscan.  2 cc of contrast dye were injected and a  total of 5 mL of Preservative-Free normal saline mixed with 40 mg of Kenalog and 1cc Ropicaine 0.2 percent were injected incrementally via the  epidurally placed needle. The needle was removed. The patient tolerated the injection well and was convalesced and discharged to home in stable condition. Should the patient have any post procedure difficulty they have been instructed on how to contact us for assistance.    Molli Barrows, MD

## 2020-01-11 ENCOUNTER — Encounter: Payer: Self-pay | Admitting: Anesthesiology

## 2020-01-14 DIAGNOSIS — N200 Calculus of kidney: Secondary | ICD-10-CM | POA: Diagnosis not present

## 2020-01-14 DIAGNOSIS — Q612 Polycystic kidney, adult type: Secondary | ICD-10-CM | POA: Diagnosis not present

## 2020-01-14 DIAGNOSIS — R809 Proteinuria, unspecified: Secondary | ICD-10-CM | POA: Diagnosis not present

## 2020-01-14 DIAGNOSIS — I10 Essential (primary) hypertension: Secondary | ICD-10-CM | POA: Diagnosis not present

## 2020-01-19 DIAGNOSIS — F339 Major depressive disorder, recurrent, unspecified: Secondary | ICD-10-CM | POA: Diagnosis not present

## 2020-01-23 IMAGING — MR MR ABDOMEN W/O CM
11 series · 48 of 48 positions shown · non-contrast
Comparison: CT abdomen/pelvis dated 04/25/2009

CLINICAL DATA: Autosomal dominant polycystic kidney disease

EXAM:
MRI ABDOMEN WITHOUT CONTRAST
TECHNIQUE: Multiplanar multisequence MR imaging was performed without the
administration of intravenous contrast.

[Series 4: T2 · coronal · 6.0mm · 1.19mm/px · 3 of 30 slices shown]
[im 1/30]
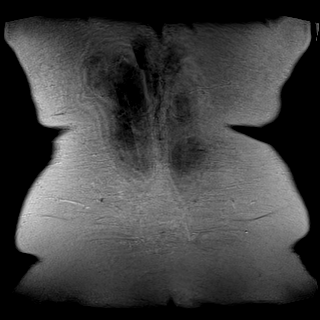
[im 15/30]
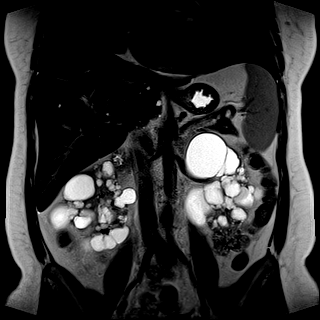
[im 30/30]
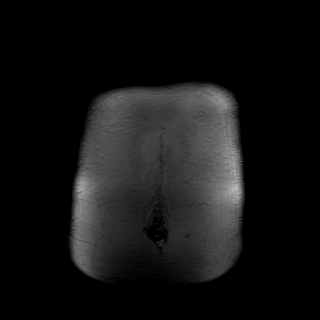

[Series 5: ax haste · axial · 6.0mm · 1.19mm/px · z∈[-258,+15]mm · 3 of 39 slices shown]
[im 1/39]
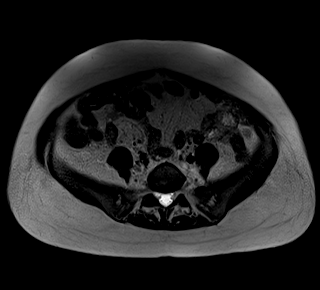
[im 20/39]
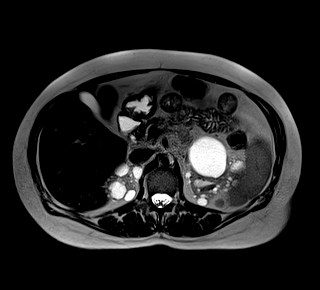
[im 39/39]
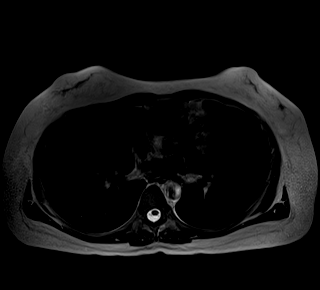

[Series 7: T2 fat-sat · axial · 6.0mm · 1.19mm/px · z∈[-258,+15]mm · 3 of 39 slices shown]
[im 1/39]
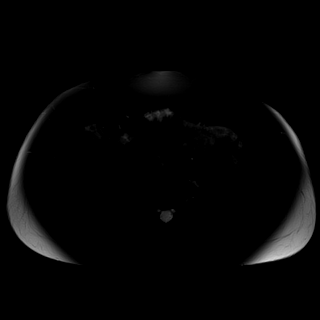
[im 20/39]
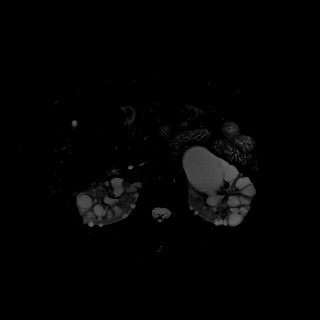
[im 39/39]
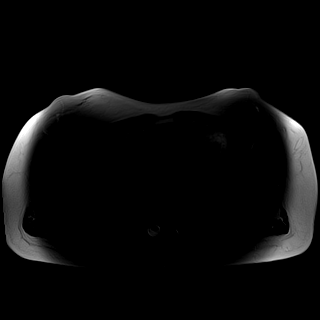

[Series 9: t1_vibe_opp-in_tra_p4_bh · axial · 3.0mm · 1.19mm/px · z∈[-264,+21]mm · 8 of 96 slices shown (1 of 2)]
[im 1/96]
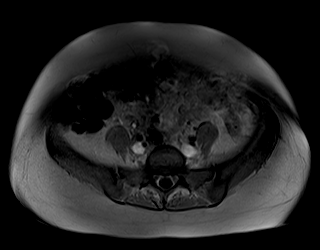
[im 14/96]
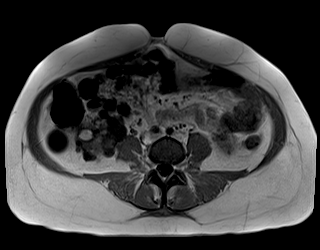
[im 28/96]
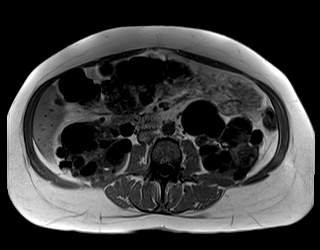
[im 41/96]
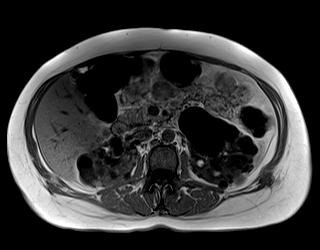
[im 55/96]
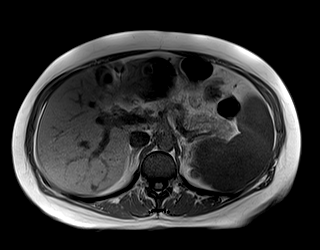
[im 68/96]
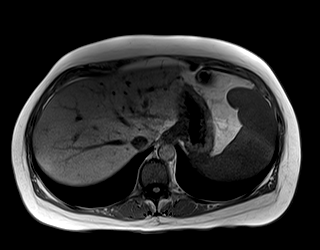
[im 82/96]
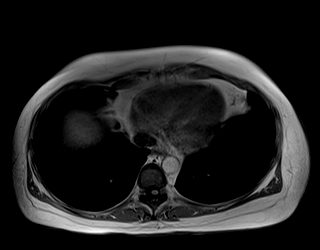
[im 96/96]
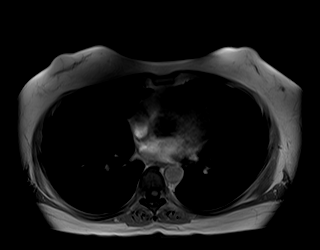

[Series 9: t1_vibe_opp-in_tra_p4_bh · axial · 3.0mm · 1.19mm/px · z∈[-264,+21]mm · 8 of 96 slices shown (2 of 2)]
[im 1/96]
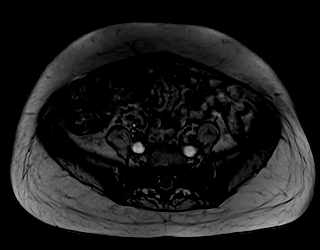
[im 14/96]
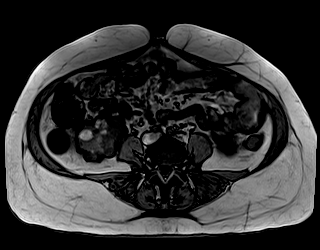
[im 28/96]
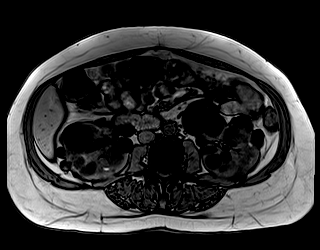
[im 41/96]
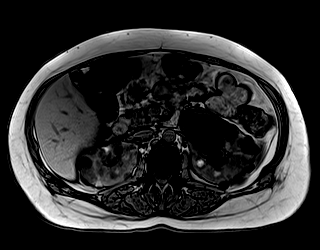
[im 55/96]
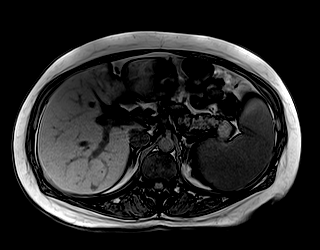
[im 68/96]
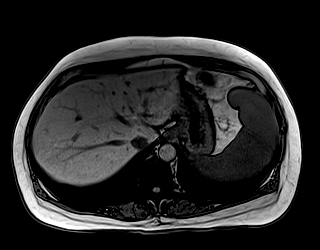
[im 82/96]
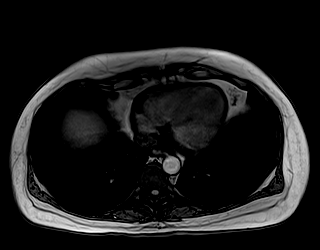
[im 96/96]
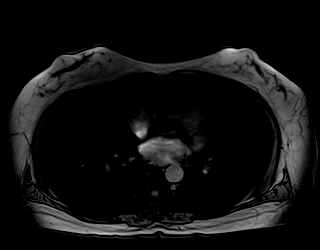

[Series 10: DWI · axial · 6.0mm · 1.42mm/px · z∈[-258,+15]mm · 3 of 39 slices shown (1 of 4)]
[im 1/39]
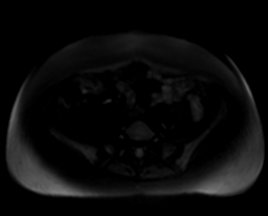
[im 20/39]
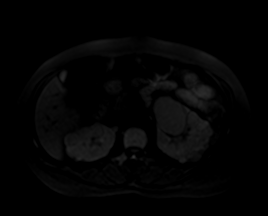
[im 39/39]
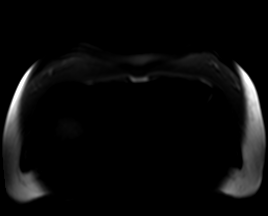

[Series 10: DWI · axial · 6.0mm · 1.42mm/px · z∈[-258,+15]mm · 3 of 39 slices shown (2 of 4)]
[im 1/39]
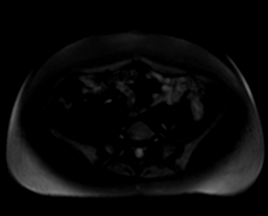
[im 20/39]
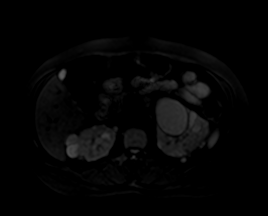
[im 39/39]
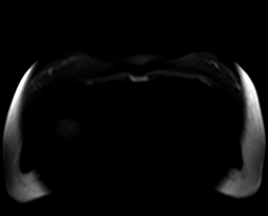

[Series 10: DWI · axial · 6.0mm · 1.42mm/px · z∈[-258,+15]mm · 3 of 39 slices shown (3 of 4)]
[im 1/39]
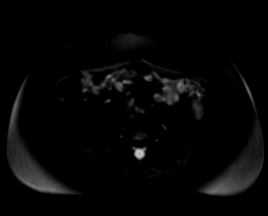
[im 20/39]
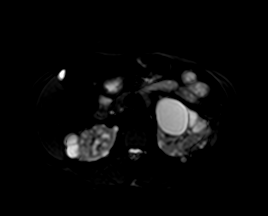
[im 39/39]
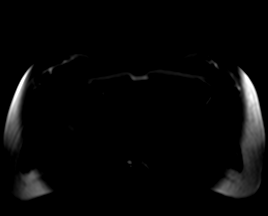

[Series 11: DWI · axial · 6.0mm · 1.42mm/px · z∈[-258,+15]mm · 3 of 39 slices shown (4 of 4)]
[im 1/39]
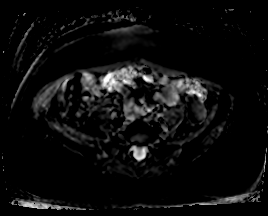
[im 20/39]
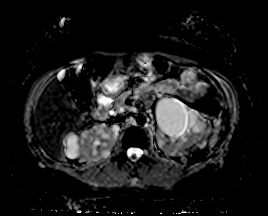
[im 39/39]
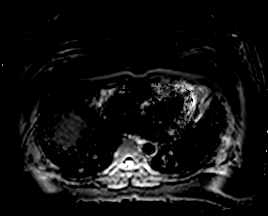

[Series 12: bSSFP · axial · 6.0mm · 0.74mm/px · z∈[-258,+15]mm · 3 of 39 slices shown]
[im 1/39]
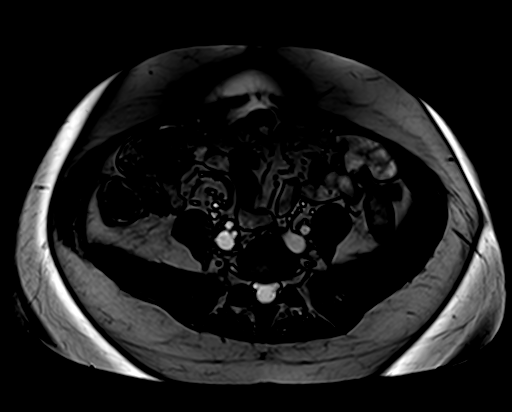
[im 20/39]
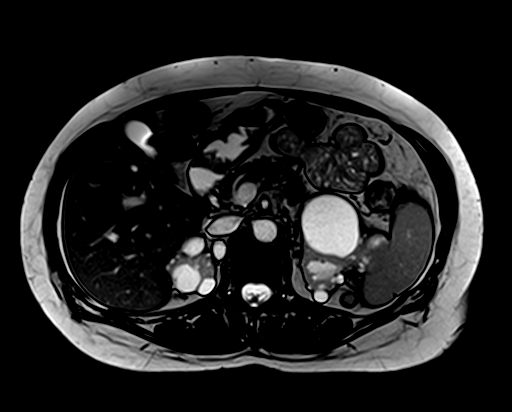
[im 39/39]
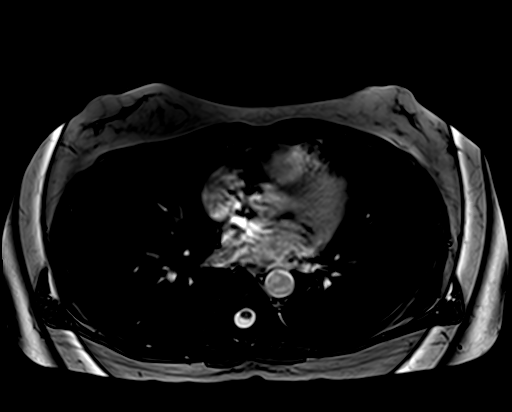

[Series 13: t1_vibe_fs_tra_p4_bh_pre · axial · 3.0mm · 1.19mm/px · z∈[-264,+21]mm · 8 of 96 slices shown]
[im 1/96]
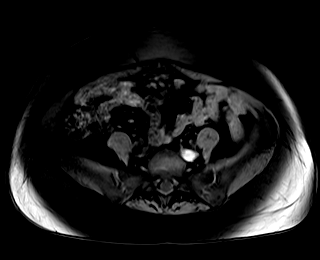
[im 14/96]
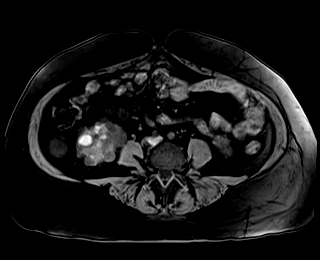
[im 28/96]
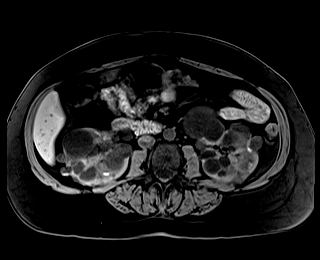
[im 41/96]
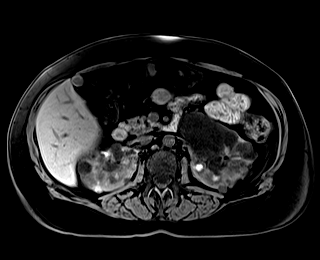
[im 55/96]
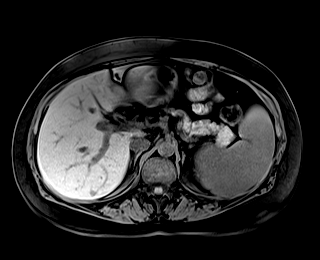
[im 68/96]
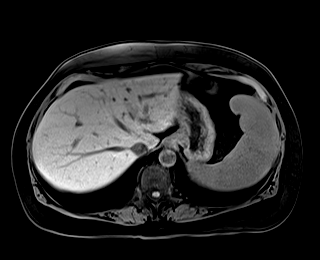
[im 82/96]
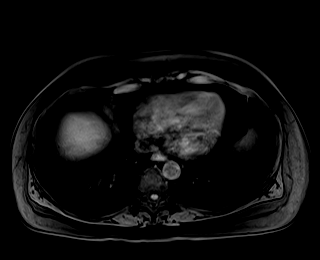
[im 96/96]
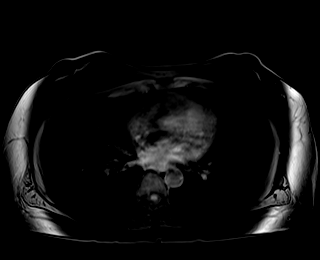

[48 of 48 positions shown; findings below may reference images not displayed]

FINDINGS: Lower chest: Lung bases are clear.

Hepatobiliary: Scattered hepatic cysts measuring up to 8 mm in
segment 7 (series 5/image 17).

Gallbladder is unremarkable. No intrahepatic or extrahepatic ductal
dilatation.

Pancreas:  Within normal limits.

Spleen:  Within normal limits.

Adrenals/Urinary Tract:  Adrenal glands are within normal limits.

Numerous bilateral renal cysts of varying sizes and complexity,
measuring up to 6.2 cm in the anterior left upper kidney (series
5/image 23) and 4.7 cm in the lateral right lower kidney (series
5/image 33), compatible with the patient's known polycystic kidney
disease.

While many of these cysts are simple (Bosniak I), some of these
cysts demonstrate intrinsic T1 hyperintensity/hemorrhage, favoring
hemorrhagic cysts (likely Bosniak II). For example, there is a
dominant 14 mm lesion inferiorly in the left kidney (series 13/image
39). However, some of these lesions are irregular, including a
dominant 2.1 cm lesion inferiorly in the right kidney (series
13/image 85). Hemorrhage within a solid mass such as papillary renal
cell carcinoma would be difficult to exclude in the absence of
contrast administration.

No hydronephrosis.

Stomach/Bowel: Stomach is within normal limits.

Visualized bowel is unremarkable.

Vascular/Lymphatic:  No evidence of abdominal aortic aneurysm.

No suspicious abdominal lymphadenopathy.

Other:  No abdominal ascites.

Musculoskeletal: No focal osseous lesions.
IMPRESSION: Numerous renal cysts of varying sizes and complexity, compatible
with the patient's known polycystic kidney disease.

Some of these lesions are hemorrhagic and remain incompletely
characterized in the absence of intravenous contrast administration.
While many likely reflect benign hemorrhagic cysts, some could
reflect solid lesions with superimposed hemorrhage. This is
considered the baseline evaluation.

Notably, the patient had a normal GFR in January 2017 and (if this
has not changed) would be eligible for MR contrast. As such,
follow-up MR abdomen with/without contrast is suggested in 6 months.
If the patient is not eligible for contrast administration, consider
follow-up MRI abdomen without contrast in 6-12 months to assess for
changes in size.

## 2020-02-12 ENCOUNTER — Other Ambulatory Visit: Payer: Self-pay | Admitting: Anesthesiology

## 2020-03-11 ENCOUNTER — Encounter: Payer: Self-pay | Admitting: Anesthesiology

## 2020-03-11 ENCOUNTER — Other Ambulatory Visit: Payer: Self-pay

## 2020-03-11 ENCOUNTER — Ambulatory Visit: Payer: Medicare Other | Attending: Anesthesiology | Admitting: Anesthesiology

## 2020-03-11 DIAGNOSIS — Q613 Polycystic kidney, unspecified: Secondary | ICD-10-CM

## 2020-03-11 DIAGNOSIS — M5442 Lumbago with sciatica, left side: Secondary | ICD-10-CM | POA: Diagnosis not present

## 2020-03-11 DIAGNOSIS — F1190 Opioid use, unspecified, uncomplicated: Secondary | ICD-10-CM | POA: Diagnosis not present

## 2020-03-11 DIAGNOSIS — M47816 Spondylosis without myelopathy or radiculopathy, lumbar region: Secondary | ICD-10-CM | POA: Diagnosis not present

## 2020-03-11 DIAGNOSIS — G894 Chronic pain syndrome: Secondary | ICD-10-CM

## 2020-03-11 DIAGNOSIS — G8929 Other chronic pain: Secondary | ICD-10-CM

## 2020-03-11 DIAGNOSIS — M5136 Other intervertebral disc degeneration, lumbar region: Secondary | ICD-10-CM

## 2020-03-11 MED ORDER — MORPHINE SULFATE ER 60 MG PO TBCR
60.0000 mg | EXTENDED_RELEASE_TABLET | Freq: Three times a day (TID) | ORAL | 0 refills | Status: DC
Start: 2020-03-12 — End: 2020-04-11

## 2020-03-11 MED ORDER — MORPHINE SULFATE ER 60 MG PO TBCR: 60.0000 mg | EXTENDED_RELEASE_TABLET | Freq: Three times a day (TID) | ORAL | 0 refills | Status: DC

## 2020-03-11 MED ORDER — HYDROCODONE-ACETAMINOPHEN 10-325 MG PO TABS: 1.0000 | ORAL_TABLET | Freq: Two times a day (BID) | ORAL | 0 refills | Status: DC | PRN

## 2020-03-11 MED ORDER — HYDROCODONE-ACETAMINOPHEN 10-325 MG PO TABS
1.0000 | ORAL_TABLET | Freq: Two times a day (BID) | ORAL | 0 refills | Status: AC | PRN
Start: 2020-03-12 — End: 2020-04-11

## 2020-03-11 NOTE — Progress Notes (Signed)
Virtual Visit via Telephone Note  I connected with Megan Brock on 03/11/20 at  1:30 PM EST by telephone and verified that I am speaking with the correct person using two identifiers.  Location: Patient: Home Provider: Pain control center   I discussed the limitations, risks, security and privacy concerns of performing an evaluation and management service by telephone and the availability of in person appointments. I also discussed with the patient that there may be a patient responsible charge related to this service. The patient expressed understanding and agreed to proceed.   History of Present Illness: I spoke with Megan Brock via telephone as we were unable to link for the video portion of the virtual conference and she reports that she has been doing well.  She still having some back pain in the same quality characteristic distribution as previously reported but this is generally well controlled with her medication management.  She takes her medications as prescribed with her hydrocodone and MS Contin and this combination keeps the pain under decent control.  It enables her to stay functional and it is without side effect reported.  She takes her Flexeril also for some muscle spasms in the low back and this works well.  She is no longer having any significant right leg sciatica and her left leg sciatica resolved many months ago following a previous epidural and has not recurred.  She is doing her stretching strengthening exercises and trying to stay active as best possible.  She was recently seen by her nephrologist and does have some active cysts in the kidneys but this has been stable as has her kidney function.   Observations/Objective:  Current Outpatient Medications:  .  amLODipine (NORVASC) 5 MG tablet, TAKE 1 TABLET(5 MG) BY MOUTH DAILY, Disp: 90 tablet, Rfl: 0 .  cyclobenzaprine (FLEXERIL) 10 MG tablet, TAKE 1 TABLET(10 MG) BY MOUTH TWICE DAILY, Disp: 60 tablet, Rfl: 3 .  [START  ON 03/12/2020] HYDROcodone-acetaminophen (NORCO) 10-325 MG tablet, Take 1 tablet by mouth 2 (two) times daily as needed for moderate pain or severe pain., Disp: 45 tablet, Rfl: 0 .  [START ON 04/11/2020] HYDROcodone-acetaminophen (NORCO) 10-325 MG tablet, Take 1 tablet by mouth 2 (two) times daily as needed for moderate pain or severe pain., Disp: 45 tablet, Rfl: 0 .  lisinopril (ZESTRIL) 10 MG tablet, TAKE 1 TABLET(10 MG) BY MOUTH DAILY, Disp: 90 tablet, Rfl: 1 .  [START ON 03/12/2020] morphine (MS CONTIN) 60 MG 12 hr tablet, Take 1 tablet (60 mg total) by mouth 3 (three) times daily., Disp: 75 tablet, Rfl: 0 .  [START ON 04/11/2020] morphine (MS CONTIN) 60 MG 12 hr tablet, Take 1 tablet (60 mg total) by mouth 3 (three) times daily., Disp: 75 tablet, Rfl: 0 .  naloxone (NARCAN) nasal spray 4 mg/0.1 mL, For excess sedation from opioids., Disp: 1 kit, Rfl: 2 .  Tolvaptan (JYNARQUE) 90 & 30 MG TBPK, Take 1 tablet by mouth 2 (two) times daily. Takes 60 mg in the AM and 30 mg 8 hours later. , Disp: , Rfl:   Assessment and Plan: 1. Lumbar spondylosis   2. Chronic bilateral low back pain with left-sided sciatica   3. Chronic pain syndrome   4. Chronic, continuous use of opioids   5. DDD (degenerative disc disease), lumbar   6. Polycystic kidney disease   Upon discussion today we will proceed with refill medications for November 24 and December 24.  No changes will be made in her current protocol  and I have reviewed the [facility] practitioner database information and it is appropriate.  She has been compliant with the regimen and getting good relief with the opioid medications.  I see no indication to scheduling any further interventional procedures at this stage as her sciatica has been stable but have requested that she reach out to [place] should this be indicated or necessary.  She is to continue follow-up with her primary care physicians and nephrologist with a schedule return to the pain clinic in 2  months.  Follow Up Instructions:    I discussed the assessment and treatment plan with the patient. The patient was provided an opportunity to ask questions and all were answered. The patient agreed with the plan and demonstrated an understanding of the instructions.   The patient was advised to call back or seek an in-person evaluation if the symptoms worsen or if the condition fails to improve as anticipated.  I provided 30 minutes of non-face-to-face time during this encounter.   Molli Barrows, MD

## 2020-03-13 ENCOUNTER — Other Ambulatory Visit: Payer: Self-pay | Admitting: Family Medicine

## 2020-03-13 DIAGNOSIS — R030 Elevated blood-pressure reading, without diagnosis of hypertension: Secondary | ICD-10-CM

## 2020-03-13 NOTE — Telephone Encounter (Signed)
Requested medications are due for refill today yes  Requested medications are on the active medication list yes  Last refill 8/25  Last visit 11/2019  Future visit scheduled no  Notes to clinic Pt already given 3 months with a note to schedule appt, no appt scheduled.

## 2020-04-05 DIAGNOSIS — F339 Major depressive disorder, recurrent, unspecified: Secondary | ICD-10-CM | POA: Diagnosis not present

## 2020-04-21 DIAGNOSIS — Q612 Polycystic kidney, adult type: Secondary | ICD-10-CM | POA: Diagnosis not present

## 2020-05-07 ENCOUNTER — Other Ambulatory Visit: Payer: Self-pay

## 2020-05-07 ENCOUNTER — Ambulatory Visit: Payer: Medicare Other | Attending: Anesthesiology | Admitting: Anesthesiology

## 2020-05-07 ENCOUNTER — Encounter: Payer: Self-pay | Admitting: Anesthesiology

## 2020-05-07 DIAGNOSIS — M51369 Other intervertebral disc degeneration, lumbar region without mention of lumbar back pain or lower extremity pain: Secondary | ICD-10-CM

## 2020-05-07 DIAGNOSIS — M47816 Spondylosis without myelopathy or radiculopathy, lumbar region: Secondary | ICD-10-CM

## 2020-05-07 DIAGNOSIS — F1190 Opioid use, unspecified, uncomplicated: Secondary | ICD-10-CM | POA: Diagnosis not present

## 2020-05-07 DIAGNOSIS — G894 Chronic pain syndrome: Secondary | ICD-10-CM

## 2020-05-07 DIAGNOSIS — M5442 Lumbago with sciatica, left side: Secondary | ICD-10-CM | POA: Diagnosis not present

## 2020-05-07 DIAGNOSIS — Q613 Polycystic kidney, unspecified: Secondary | ICD-10-CM

## 2020-05-07 DIAGNOSIS — M5136 Other intervertebral disc degeneration, lumbar region: Secondary | ICD-10-CM

## 2020-05-07 DIAGNOSIS — G8929 Other chronic pain: Secondary | ICD-10-CM

## 2020-05-07 DIAGNOSIS — M5431 Sciatica, right side: Secondary | ICD-10-CM

## 2020-05-07 MED ORDER — MORPHINE SULFATE ER 60 MG PO TBCR
60.0000 mg | EXTENDED_RELEASE_TABLET | Freq: Three times a day (TID) | ORAL | 0 refills | Status: DC
Start: 2020-05-10 — End: 2020-05-08

## 2020-05-07 MED ORDER — HYDROCODONE-ACETAMINOPHEN 10-325 MG PO TABS: 1.0000 | ORAL_TABLET | Freq: Two times a day (BID) | ORAL | 0 refills | Status: DC | PRN

## 2020-05-07 MED ORDER — HYDROCODONE-ACETAMINOPHEN 10-325 MG PO TABS
1.0000 | ORAL_TABLET | Freq: Two times a day (BID) | ORAL | 0 refills | Status: DC | PRN
Start: 2020-05-10 — End: 2020-05-08

## 2020-05-07 MED ORDER — MORPHINE SULFATE ER 60 MG PO TBCR: 60.0000 mg | EXTENDED_RELEASE_TABLET | Freq: Three times a day (TID) | ORAL | 0 refills | Status: DC

## 2020-05-07 NOTE — Progress Notes (Signed)
..Virtual Visit via Telephone Note  I connected with Megan Brock on 05/07/20 at  1:35 PM EST by telephone and verified that I am speaking with the correct person using two identifiers.  Location: Patient: Home Provider: Pain control center   I discussed the limitations, risks, security and privacy concerns of performing an evaluation and management service by telephone and the availability of in person appointments. I also discussed with the patient that there may be a patient responsible charge related to this service. The patient expressed understanding and agreed to proceed.   History of Present Illness: I spoke with Megan Brock today via telephone as we were unable to link for the video portion of the virtual conference but she reports that she has been doing reasonably well with her back pain.  The cold weather has certainly cause some spasming and she still has some periodic sciatica but generally this is much better following the recent epidural steroids.  Her last injection was in September and she reports that this has helped significantly.  She still taking her medications as prescribed and this helped with the flank pain secondary to the polycystic kidney disease.  This combination of medications is working well for her with no side effects and she continues to derive good functional benefit without problem.  Otherwise she is in her usual state of health.  She has been followed by Dr. Zollie Scale for her blood pressure management which has been somewhat difficult to control as of recent but she reports that it is improving.  Otherwise she is in her usual state of health with no new changes in the quality characteristic or distribution of her low back pain or leg pain. Observations/Objective:  Current Outpatient Medications:  .  [START ON 06/09/2020] HYDROcodone-acetaminophen (NORCO) 10-325 MG tablet, Take 1 tablet by mouth 2 (two) times daily as needed for moderate pain or severe pain., Disp: 45  tablet, Rfl: 0 .  [START ON 06/09/2020] morphine (MS CONTIN) 60 MG 12 hr tablet, Take 1 tablet (60 mg total) by mouth 3 (three) times daily., Disp: 75 tablet, Rfl: 0 .  amLODipine (NORVASC) 5 MG tablet, TAKE 1 TABLET(5 MG) BY MOUTH DAILY, Disp: 90 tablet, Rfl: 0 .  cyclobenzaprine (FLEXERIL) 10 MG tablet, TAKE 1 TABLET(10 MG) BY MOUTH TWICE DAILY, Disp: 60 tablet, Rfl: 3 .  [START ON 05/10/2020] HYDROcodone-acetaminophen (NORCO) 10-325 MG tablet, Take 1 tablet by mouth 2 (two) times daily as needed for moderate pain or severe pain., Disp: 45 tablet, Rfl: 0 .  lisinopril (ZESTRIL) 10 MG tablet, TAKE 1 TABLET(10 MG) BY MOUTH DAILY, Disp: 90 tablet, Rfl: 1 .  [START ON 05/10/2020] morphine (MS CONTIN) 60 MG 12 hr tablet, Take 1 tablet (60 mg total) by mouth 3 (three) times daily., Disp: 75 tablet, Rfl: 0 .  naloxone (NARCAN) nasal spray 4 mg/0.1 mL, For excess sedation from opioids., Disp: 1 kit, Rfl: 2 .  Tolvaptan (JYNARQUE) 90 & 30 MG TBPK, Take 1 tablet by mouth 2 (two) times daily. Takes 60 mg in the AM and 30 mg 8 hours later. , Disp: , Rfl:   Assessment and Plan: 1. Lumbar spondylosis   2. Chronic bilateral low back pain with left-sided sciatica   3. Chronic pain syndrome   4. Chronic, continuous use of opioids   5. DDD (degenerative disc disease), lumbar   6. Polycystic kidney disease   7. Right sided sciatica   Based on our discussion today and upon review of the [place]  practitioner database information I will refill her medicines for the next 2 months dated for January 22 and February 21 for her Norco and MS Contin.  No other changes will be initiated today.  I encouraged her to continue with stretching strengthening exercises.  No further interventional therapy appears indicated at this time with a return to clinic scheduled in 2 months.  I encouraged her to follow-up with Dr. Zollie Scale and with her primary care physician for baseline medical care.  Follow Up Instructions:    I  discussed the assessment and treatment plan with the patient. The patient was provided an opportunity to ask questions and all were answered. The patient agreed with the plan and demonstrated an understanding of the instructions.   The patient was advised to call back or seek an in-person evaluation if the symptoms worsen or if the condition fails to improve as anticipated.  I provided 30 minutes of non-face-to-face time during this encounter.   Molli Barrows, MD

## 2020-05-08 MED ORDER — HYDROCODONE-ACETAMINOPHEN 10-325 MG PO TABS: 1.0000 | ORAL_TABLET | Freq: Two times a day (BID) | ORAL | 0 refills | Status: DC | PRN

## 2020-05-08 MED ORDER — HYDROCODONE-ACETAMINOPHEN 10-325 MG PO TABS: 1.0000 | ORAL_TABLET | Freq: Two times a day (BID) | ORAL | 0 refills | Status: AC | PRN

## 2020-05-08 MED ORDER — MORPHINE SULFATE ER 60 MG PO TBCR: 60.0000 mg | EXTENDED_RELEASE_TABLET | Freq: Three times a day (TID) | ORAL | 0 refills | Status: DC

## 2020-05-08 NOTE — Addendum Note (Signed)
Addended by: [name] on: 05/08/2020 10:31 AM   Modules accepted: Orders

## 2020-05-15 DIAGNOSIS — Z20828 Contact with and (suspected) exposure to other viral communicable diseases: Secondary | ICD-10-CM | POA: Diagnosis not present

## 2020-05-20 DIAGNOSIS — U071 COVID-19: Secondary | ICD-10-CM

## 2020-05-20 HISTORY — DX: COVID-19: U07.1

## 2020-05-22 ENCOUNTER — Telehealth: Payer: Self-pay | Admitting: Anesthesiology

## 2020-05-22 NOTE — Telephone Encounter (Signed)
Patient having pain in lower back and right leg, patient would like to have an epidural. There are no openings and also need orders put in for lesi.  Informed patient Dr. Pernell Dupre is out until Monday

## 2020-05-26 ENCOUNTER — Ambulatory Visit: Payer: Self-pay | Admitting: Family Medicine

## 2020-05-26 ENCOUNTER — Telehealth: Payer: Self-pay

## 2020-05-26 DIAGNOSIS — N2889 Other specified disorders of kidney and ureter: Secondary | ICD-10-CM

## 2020-05-26 DIAGNOSIS — U071 COVID-19: Secondary | ICD-10-CM

## 2020-05-26 DIAGNOSIS — Z720 Tobacco use: Secondary | ICD-10-CM

## 2020-05-26 DIAGNOSIS — Z6830 Body mass index (BMI) 30.0-30.9, adult: Secondary | ICD-10-CM

## 2020-05-26 DIAGNOSIS — E6609 Other obesity due to excess calories: Secondary | ICD-10-CM

## 2020-05-26 DIAGNOSIS — I151 Hypertension secondary to other renal disorders: Secondary | ICD-10-CM

## 2020-05-26 DIAGNOSIS — Q612 Polycystic kidney, adult type: Secondary | ICD-10-CM

## 2020-05-26 NOTE — Addendum Note (Signed)
Addended by: Benjiman Core on: 05/26/2020 03:55 PM   Modules accepted: Orders

## 2020-05-26 NOTE — Telephone Encounter (Signed)
Ok to take OTC cold medications for cough, head and sinus congestion. There's not really anything OTC that will get you over Covid sooner. Some people think vitamin D and Zinc supplements help, but those probably only help if taken before or within the first few days of symptoms. Main thing is to drink plenty of fluids and get plenty of rest.

## 2020-05-26 NOTE — Telephone Encounter (Addendum)
Patient advised and verbalized understanding.   Patient wants to know if you can refer her to have an antibody infusion? She says she called a DHS line on the COVID website and was told that she meets the criteria for patients who are eligible for infusion treatment. Patient was told that there are 3 locations in the area closest to her that offer infusion treatments.  She says the person she spoke with from [facility] advised her that she just needed her PCP to send in a referral order. Her symptoms started 05/23/2020. She tested positive for COVID 05/25/2020.  Please advise.

## 2020-05-26 NOTE — Telephone Encounter (Signed)
See phone message.  Pt would like a referral for infusion therapy.    Thanks,   -Vernona Rieger

## 2020-05-26 NOTE — Telephone Encounter (Signed)
Copied from CRM [id number]. Topic: General - Other >> May 26, 2020  1:19 PM Megan Brock wrote: Reason for CRM: Patient called to speak with the nurse because she said she had a few covid questions and had spoken with the nurse earlier.  Please advise and call patient to speak with her at [phone]

## 2020-05-26 NOTE — Telephone Encounter (Signed)
Pt reports tested positive covid Sunday. Symptoms onset Friday night. States 1 episode vomiting Friday, now with congestion, "Sinus like headache and pressure." Reports 2 episodes diarrhea past 24 hours. States fever 101.1 Saturday, afebrile now denies cough , no SOB "Just stuffy."  Home care advise given and reviewed guidelines for self isolation. Pt has multiple questions regarding OTC meds she can take with her medical issues.   Discussed options; pt would like Dr. Maralyn Sago advise regarding medications. Please advise: CB# [phone]  Reason for Disposition . [1] [id number] diagnosed by positive lab test (e.g., PCR, rapid self-test kit) AND [2] mild symptoms (e.g., cough, fever, others) AND [0] no complications or SOB  Answer Assessment - Initial Assessment Questions 1. COVID-19 DIAGNOSIS: "Who made your COVID-19 diagnosis?" "Was it confirmed by a positive lab test?" If not diagnosed by a HCP, ask "Are there lots of cases (community spread) where you live?" Note: See public health department website, if unsure.     Home tests 2. COVID-19 EXPOSURE: "Was there any known exposure to COVID before the symptoms began?" CDC Definition of close contact: within 6 feet (2 meters) for a total of 15 minutes or more over a 24-hour period.      no 3. ONSET: "When did the COVID-19 symptoms start?"      Friday night 4. WORST SYMPTOM: "What is your worst symptom?" (e.g., cough, fever, shortness of breath, muscle aches)     "Stuffy, like sinus infection." 5. COUGH: "Do you have a cough?" If Yes, ask: "How bad is the cough?"       no 6. FEVER: "Do you have a fever?" If Yes, ask: "What is your temperature, how was it measured, and when did it start?"     no 7. RESPIRATORY STATUS: "Describe your breathing?" (e.g., shortness of breath, wheezing, unable to speak)      *No Answer* 8. BETTER-SAME-WORSE: "Are you getting better, staying the same or getting worse compared to yesterday?"  If getting worse, ask, "In  what way?"     *No Answer* 9. HIGH RISK DISEASE: "Do you have any chronic medical problems?" (e.g., asthma, heart or lung disease, weak immune system, obesity, etc.)     *No Answer* 10. VACCINE: "Have you gotten the COVID-19 vaccine?" If Yes ask: "Which one, how many shots, when did you get it?"       *No Answer* 12. OTHER SYMPTOMS: "Do you have any other symptoms?"  (e.g., chills, fatigue, headache, loss of smell or taste, muscle pain, sore throat; new loss of smell or taste especially support the diagnosis of COVID-19)       Diarrhea, 2xs in 24 hour period. Vomiting once Friday night, afebrile presently. Has more energy today. States staying hydrated.  Protocols used: CORONAVIRUS (COVID-19) DIAGNOSED OR SUSPECTED-A-AH

## 2020-05-26 NOTE — Addendum Note (Signed)
Addended by: Malva Limes on: 05/26/2020 02:26 PM   Modules accepted: Orders

## 2020-05-26 NOTE — Telephone Encounter (Signed)
Ok for referral to covid treatment clinic. Please answer questions on order.

## 2020-05-27 ENCOUNTER — Telehealth: Payer: Self-pay | Admitting: Nurse Practitioner

## 2020-05-27 ENCOUNTER — Encounter: Payer: Self-pay | Admitting: Nurse Practitioner

## 2020-05-27 ENCOUNTER — Other Ambulatory Visit: Payer: Self-pay | Admitting: Nurse Practitioner

## 2020-05-27 ENCOUNTER — Ambulatory Visit (HOSPITAL_COMMUNITY)
Admission: RE | Admit: 2020-05-27 | Discharge: 2020-05-27 | Disposition: A | Discharge status: Home / Self Care | Payer: Medicare Other | Source: Ambulatory Visit | Attending: Pulmonary Disease | Admitting: Pulmonary Disease

## 2020-05-27 DIAGNOSIS — E663 Overweight: Secondary | ICD-10-CM

## 2020-05-27 DIAGNOSIS — I10 Essential (primary) hypertension: Secondary | ICD-10-CM | POA: Diagnosis not present

## 2020-05-27 DIAGNOSIS — Z6825 Body mass index (BMI) 25.0-25.9, adult: Secondary | ICD-10-CM | POA: Insufficient documentation

## 2020-05-27 DIAGNOSIS — U071 COVID-19: Secondary | ICD-10-CM | POA: Diagnosis not present

## 2020-05-27 MED ORDER — METHYLPREDNISOLONE SODIUM SUCC 125 MG IJ SOLR: 125.0000 mg | Freq: Once | INTRAMUSCULAR | Status: DC | PRN

## 2020-05-27 MED ORDER — SODIUM CHLORIDE 0.9 % IV SOLN: INTRAVENOUS | Status: DC | PRN

## 2020-05-27 MED ORDER — ALBUTEROL SULFATE HFA 108 (90 BASE) MCG/ACT IN AERS: 2.0000 | INHALATION_SPRAY | Freq: Once | RESPIRATORY_TRACT | Status: DC | PRN

## 2020-05-27 MED ORDER — SOTROVIMAB 500 MG/8ML IV SOLN
500.0000 mg | Freq: Once | INTRAVENOUS | Status: AC
  Administered 2020-05-27: 500 mg via INTRAVENOUS

## 2020-05-27 MED ORDER — EPINEPHRINE 0.3 MG/0.3ML IJ SOAJ: 0.3000 mg | Freq: Once | INTRAMUSCULAR | Status: DC | PRN

## 2020-05-27 MED ORDER — FAMOTIDINE IN NACL 20-0.9 MG/50ML-% IV SOLN: 20.0000 mg | Freq: Once | INTRAVENOUS | Status: DC | PRN

## 2020-05-27 MED ORDER — DIPHENHYDRAMINE HCL 50 MG/ML IJ SOLN: 50.0000 mg | Freq: Once | INTRAMUSCULAR | Status: DC | PRN

## 2020-05-27 NOTE — Discharge Instructions (Signed)

## 2020-05-27 NOTE — Progress Notes (Signed)
Patient reviewed Fact Sheet for Patients, Parents, and Caregivers for Emergency Use Authorization (EUA) of sotrovimab for the Treatment of Coronavirus. Patient also reviewed and is agreeable to the estimated cost of treatment. Patient is agreeable to proceed.   

## 2020-05-27 NOTE — Telephone Encounter (Signed)
I connected by phone with Megan Brock on 05/27/2020 at 10:46 AM to discuss the potential use of a new treatment for mild to moderate COVID-19 viral infection in non-hospitalized patients.  This patient is a 39 y.o. female that meets the FDA criteria for Emergency Use Authorization of COVID monoclonal antibody sotrovimab.  Has a (+) direct SARS-CoV-2 viral test result  Has mild or moderate COVID-19   Is NOT hospitalized due to COVID-19  Is within 10 days of symptom onset  Has at least one of the high risk factor(s) for progression to severe COVID-19 and/or hospitalization as defined in EUA.  Specific high risk criteria : BMI > 25 and Cardiovascular disease or hypertension   I have spoken and communicated the following to the patient or parent/caregiver regarding COVID monoclonal antibody treatment:  1. FDA has authorized the emergency use for the treatment of mild to moderate COVID-19 in adults and pediatric patients with positive results of direct SARS-CoV-2 viral testing who are 12 years of age and older weighing at least 40 kg, and who are at high risk for progressing to severe COVID-19 and/or hospitalization.  2. The significant known and potential risks and benefits of COVID monoclonal antibody, and the extent to which such potential risks and benefits are unknown.  3. Information on available alternative treatments and the risks and benefits of those alternatives, including clinical trials.  4. Patients treated with COVID monoclonal antibody should continue to self-isolate and use infection control measures (e.g., wear mask, isolate, social distance, avoid sharing personal items, clean and disinfect "high touch" surfaces, and frequent handwashing) according to CDC guidelines.   5. The patient or parent/caregiver has the option to accept or refuse COVID monoclonal antibody treatment.  After reviewing this information with the patient, the patient has agreed to receive one of the  available covid 19 monoclonal antibodies and will be provided an appropriate fact sheet prior to infusion.   Megan Stachnik, NP 05/27/2020 10:46 AM     

## 2020-05-27 NOTE — Discharge Summary (Incomplete)
Diagnosis: COVID-19  Physician: Dr. Patrick Wright  Procedure: Covid Infusion Clinic Med: Sotrovimab infusion - Provided patient with sotrovimab fact sheet for patients, parents, and caregivers prior to infusion.   Complications: No immediate complications noted  Discharge: Discharged home    

## 2020-05-27 NOTE — Progress Notes (Signed)
I connected by phone with [name] on 05/27/2020 at 10:46 AM to discuss the potential use of a new treatment for mild to moderate COVID-19 viral infection in non-hospitalized patients.  This patient is a 40 y.o. female that meets the FDA criteria for Emergency Use Authorization of COVID monoclonal antibody sotrovimab.  Has a (+) direct SARS-CoV-2 viral test result  Has mild or moderate COVID-19   Is NOT hospitalized due to COVID-19  Is within 10 days of symptom onset  Has at least one of the high risk factor(s) for progression to severe COVID-19 and/or hospitalization as defined in EUA.  Specific high risk criteria : BMI > 25 and Cardiovascular disease or hypertension   I have spoken and communicated the following to the patient or parent/caregiver regarding COVID monoclonal antibody treatment:  1. FDA has authorized the emergency use for the treatment of mild to moderate COVID-19 in adults and pediatric patients with positive results of direct SARS-CoV-2 viral testing who are 4 years of age and older weighing at least 40 kg, and who are at high risk for progressing to severe COVID-19 and/or hospitalization.  2. The significant known and potential risks and benefits of COVID monoclonal antibody, and the extent to which such potential risks and benefits are unknown.  3. Information on available alternative treatments and the risks and benefits of those alternatives, including clinical trials.  4. Patients treated with COVID monoclonal antibody should continue to self-isolate and use infection control measures (e.g., wear mask, isolate, social distance, avoid sharing personal items, clean and disinfect "high touch" surfaces, and frequent handwashing) according to CDC guidelines.   5. The patient or parent/caregiver has the option to accept or refuse COVID monoclonal antibody treatment.  After reviewing this information with the patient, the patient has agreed to receive one of the  available covid 19 monoclonal antibodies and will be provided an appropriate fact sheet prior to infusion.   [name], NP 05/27/2020 10:46 AM

## 2020-05-30 NOTE — Telephone Encounter (Signed)
Called patient / she is scheduled for march 1 at 12:45  lesi and meds.

## 2020-06-11 ENCOUNTER — Other Ambulatory Visit: Payer: Self-pay | Admitting: Family Medicine

## 2020-06-11 DIAGNOSIS — R030 Elevated blood-pressure reading, without diagnosis of hypertension: Secondary | ICD-10-CM

## 2020-06-17 ENCOUNTER — Ambulatory Visit
Admission: RE | Admit: 2020-06-17 | Discharge: 2020-06-17 | Disposition: A | Discharge status: Home / Self Care | Payer: Medicare Other | Source: Ambulatory Visit | Attending: Anesthesiology | Admitting: Anesthesiology

## 2020-06-17 ENCOUNTER — Ambulatory Visit (HOSPITAL_BASED_OUTPATIENT_CLINIC_OR_DEPARTMENT_OTHER): Payer: Medicare Other | Admitting: Anesthesiology

## 2020-06-17 ENCOUNTER — Encounter: Payer: Self-pay | Admitting: Anesthesiology

## 2020-06-17 ENCOUNTER — Other Ambulatory Visit: Payer: Self-pay

## 2020-06-17 ENCOUNTER — Other Ambulatory Visit: Payer: Self-pay | Admitting: Anesthesiology

## 2020-06-17 VITALS — BP 138/96 | HR 126 | Temp 98.1°F | Resp 19 | Ht 64.0 in | Wt 170.0 lb

## 2020-06-17 DIAGNOSIS — G8929 Other chronic pain: Secondary | ICD-10-CM

## 2020-06-17 DIAGNOSIS — F1190 Opioid use, unspecified, uncomplicated: Secondary | ICD-10-CM | POA: Diagnosis present

## 2020-06-17 DIAGNOSIS — M5431 Sciatica, right side: Secondary | ICD-10-CM | POA: Diagnosis present

## 2020-06-17 DIAGNOSIS — M5442 Lumbago with sciatica, left side: Secondary | ICD-10-CM | POA: Insufficient documentation

## 2020-06-17 DIAGNOSIS — M5136 Other intervertebral disc degeneration, lumbar region: Secondary | ICD-10-CM

## 2020-06-17 DIAGNOSIS — R52 Pain, unspecified: Secondary | ICD-10-CM

## 2020-06-17 DIAGNOSIS — G894 Chronic pain syndrome: Secondary | ICD-10-CM

## 2020-06-17 DIAGNOSIS — Q613 Polycystic kidney, unspecified: Secondary | ICD-10-CM | POA: Insufficient documentation

## 2020-06-17 DIAGNOSIS — M47816 Spondylosis without myelopathy or radiculopathy, lumbar region: Secondary | ICD-10-CM | POA: Insufficient documentation

## 2020-06-17 MED ORDER — LIDOCAINE HCL (PF) 1 % IJ SOLN
INTRAMUSCULAR | Status: AC
  Filled 2020-06-17: qty 5

## 2020-06-17 MED ORDER — LIDOCAINE HCL (PF) 1 % IJ SOLN
5.0000 mL | Freq: Once | INTRAMUSCULAR | Status: AC
  Administered 2020-06-17: 5 mL via SUBCUTANEOUS

## 2020-06-17 MED ORDER — IOHEXOL 180 MG/ML  SOLN
10.0000 mL | Freq: Once | INTRAMUSCULAR | Status: AC | PRN
  Administered 2020-06-17: 10 mL via EPIDURAL

## 2020-06-17 MED ORDER — SODIUM CHLORIDE (PF) 0.9 % IJ SOLN
INTRAMUSCULAR | Status: AC
  Filled 2020-06-17: qty 10

## 2020-06-17 MED ORDER — IOHEXOL 180 MG/ML  SOLN
INTRAMUSCULAR | Status: AC
  Filled 2020-06-17: qty 20

## 2020-06-17 MED ORDER — SODIUM CHLORIDE 0.9% FLUSH
10.0000 mL | Freq: Once | INTRAVENOUS | Status: AC
  Administered 2020-06-17: 10 mL

## 2020-06-17 MED ORDER — MORPHINE SULFATE ER 60 MG PO TBCR: 60.0000 mg | EXTENDED_RELEASE_TABLET | Freq: Three times a day (TID) | ORAL | 0 refills | Status: DC

## 2020-06-17 MED ORDER — TRIAMCINOLONE ACETONIDE 40 MG/ML IJ SUSP
INTRAMUSCULAR | Status: AC
  Filled 2020-06-17: qty 1

## 2020-06-17 MED ORDER — TRIAMCINOLONE ACETONIDE 40 MG/ML IJ SUSP
40.0000 mg | Freq: Once | INTRAMUSCULAR | Status: AC
  Administered 2020-06-17: 40 mg

## 2020-06-17 MED ORDER — ROPIVACAINE HCL 2 MG/ML IJ SOLN
INTRAMUSCULAR | Status: AC
  Filled 2020-06-17: qty 10

## 2020-06-17 MED ORDER — HYDROCODONE-ACETAMINOPHEN 10-325 MG PO TABS
1.0000 | ORAL_TABLET | Freq: Two times a day (BID) | ORAL | 0 refills | Status: DC | PRN
Start: 2020-07-09 — End: 2020-07-16

## 2020-06-17 MED ORDER — ROPIVACAINE HCL 2 MG/ML IJ SOLN
10.0000 mL | Freq: Once | INTRAMUSCULAR | Status: AC
  Administered 2020-06-17: 10 mL via EPIDURAL

## 2020-06-17 NOTE — Patient Instructions (Signed)

## 2020-06-17 NOTE — Progress Notes (Signed)
Nursing Pain Medication Assessment:  Safety precautions to be maintained throughout the outpatient stay will include: orient to surroundings, keep bed in low position, maintain call bell within reach at all times, provide assistance with transfer out of bed and ambulation.  Medication Inspection Compliance: Pill count conducted under aseptic conditions, in front of the patient. Neither the pills nor the bottle was removed from the patient's sight at any time. Once count was completed pills were immediately returned to the patient in their original bottle.  Medication #1: Morphine IR Pill/Patch Count: 53 of 75 pills remain Pill/Patch Appearance: Markings consistent with prescribed medication Bottle Appearance: Standard pharmacy container. Clearly labeled. Filled Date: 06/09/2020 Last Medication intake:  Today  Medication #2: Hydrocodone/APAP Pill/Patch Count: 29 of 45 pills remain Pill/Patch Appearance: Markings consistent with prescribed medication Bottle Appearance: Standard pharmacy container. Clearly labeled. Filled Date: 06/09/2020 Last Medication intake:  Today

## 2020-06-20 ENCOUNTER — Encounter: Payer: Self-pay | Admitting: Anesthesiology

## 2020-06-20 NOTE — Progress Notes (Signed)
Subjective:  Patient ID: Megan Brock, female    DOB: March 24, 1981  Age: 40 y.o. MRN: [id number]  CC: Back Pain (lower)   Procedure: L5-S1 epidural steroid and fluoroscopic guidance with no sedation  HPI Megan Brock presents for reevaluation.  Megan Brock has had some return of her baseline right leg sciatica for which she receives periodic lumbar epidural steroid injections.  She reports approximately 75 to 80% relief generally lasting 2 to 3 months.  Her last epidural was in 2021 and she had similar relief following that.  She has chronic low back pain with history of polycystic kidney disease for which she takes chronic opioid therapy.  Unfortunately she has failed more conservative therapy but feels that the existing medication management is working well for her and keeping her pain under good control.  The sciatica has been more problematic lately and limits her ability to exercise and keep her weight under control.  It also causes her more pain during the evening and she desires to proceed with a repeat epidural today.  No change in lower extremity strength or function or bowel or bladder function is noted.  She is seen by her nephrology doctors for kidney maintenance therapy.  Outpatient Medications Prior to Visit  Medication Sig Dispense Refill  . amLODipine (NORVASC) 5 MG tablet TAKE 1 TABLET(5 MG) BY MOUTH DAILY (Patient taking differently: 10 mg.) 30 tablet 0  . cyclobenzaprine (FLEXERIL) 10 MG tablet TAKE 1 TABLET(10 MG) BY MOUTH TWICE DAILY 60 tablet 3  . lisinopril (ZESTRIL) 10 MG tablet TAKE 1 TABLET(10 MG) BY MOUTH DAILY 30 tablet 0  . naloxone (NARCAN) nasal spray 4 mg/0.1 mL For excess sedation from opioids. 1 kit 2  . Tolvaptan 90 & 30 MG TBPK Take 1 tablet by mouth 2 (two) times daily. Takes 60 mg in the AM and 30 mg 8 hours later.    Marland Kitchen HYDROcodone-acetaminophen (NORCO) 10-325 MG tablet Take 1 tablet by mouth 2 (two) times daily as needed for moderate pain or severe pain. 45  tablet 0  . morphine (MS CONTIN) 60 MG 12 hr tablet Take 1 tablet (60 mg total) by mouth 3 (three) times daily. 75 tablet 0   No facility-administered medications prior to visit.    Review of Systems CNS: No confusion or sedation Cardiac: No angina or palpitations GI: No abdominal pain or constipation Constitutional: No nausea vomiting fevers or chills  Objective:  BP (!) 138/96   Pulse (!) 126   Temp 98.1 F (36.7 C) (Temporal)   Resp 19   Ht 5' 4"  (1.626 m)   Wt 170 lb (77.1 kg)   SpO2 98%   BMI 29.18 kg/m    BP Readings from Last 3 Encounters:  06/17/20 (!) 138/96  05/27/20 103/78  01/10/20 126/81     Wt Readings from Last 3 Encounters:  06/17/20 170 lb (77.1 kg)  01/10/20 170 lb (77.1 kg)  06/26/19 185 lb (83.9 kg)     Physical Exam Pt is alert and oriented PERRL EOMI HEART IS RRR no murmur or rub LCTA no wheezing or rales MUSCULOSKELETAL reveals some paraspinous muscle tenderness and a positive straight leg raise on the right side equivocal on the left.  She has some mild pain on extension as well.  She has mild pain going from seated to standing but her lower extremity muscle tone and bulk is at baseline.  She is ambulating with a mildly antalgic gait  Labs  No results found for:  HGBA1C Lab Results  Component Value Date   LDLCALC 105 (H) 07/17/2015   CREATININE 0.98 02/08/2017    -------------------------------------------------------------------------------------------------------------------- Lab Results  Component Value Date   WBC 9.2 02/08/2017   HGB 13.2 02/08/2017   HCT 37.7 02/08/2017   PLT 197 02/08/2017   GLUCOSE 87 02/08/2017   CHOL 174 07/17/2015   TRIG 139 07/17/2015   HDL 41 07/17/2015   LDLCALC 105 (H) 07/17/2015   ALT 10 02/08/2017   AST 12 02/08/2017   NA 140 02/08/2017   K 4.0 02/08/2017   CL 105 02/08/2017   CREATININE 0.98 02/08/2017   BUN 18 02/08/2017   CO2 29 02/08/2017   TSH 2.530 06/26/2019     --------------------------------------------------------------------------------------------------------------------- DG PAIN CLINIC C-ARM 1-60 MIN NO REPORT  Result Date: 06/17/2020 Fluoro was used, but no Radiologist interpretation will be provided. Please refer to "NOTES" tab for provider progress note.    Assessment & Plan:   Megan Brock was seen today for back pain.  Diagnoses and all orders for this visit:  Lumbar spondylosis  Chronic bilateral low back pain with left-sided sciatica  Chronic, continuous use of opioids  DDD (degenerative disc disease), lumbar  Polycystic kidney disease  Chronic pain syndrome  Right sided sciatica  Other orders -     HYDROcodone-acetaminophen (NORCO) 10-325 MG tablet; Take 1 tablet by mouth 2 (two) times daily as needed for moderate pain or severe pain. -     morphine (MS CONTIN) 60 MG 12 hr tablet; Take 1 tablet (60 mg total) by mouth 3 (three) times daily. -     triamcinolone acetonide (KENALOG-40) injection 40 mg -     sodium chloride flush (NS) 0.9 % injection 10 mL -     ropivacaine (PF) 2 mg/mL (0.2%) (NAROPIN) injection 10 mL -     lidocaine (PF) (XYLOCAINE) 1 % injection 5 mL -     iohexol (OMNIPAQUE) 180 MG/ML injection 10 mL        ----------------------------------------------------------------------------------------------------------------------  Problem List Items Addressed This Visit      Unprioritized   Chronic lower back pain   Relevant Medications   HYDROcodone-acetaminophen (NORCO) 10-325 MG tablet (Start on 07/09/2020)   morphine (MS CONTIN) 60 MG 12 hr tablet (Start on 07/09/2020)   Chronic pain syndrome (Chronic)   Relevant Medications   HYDROcodone-acetaminophen (NORCO) 10-325 MG tablet (Start on 07/09/2020)   morphine (MS CONTIN) 60 MG 12 hr tablet (Start on 07/09/2020)   Chronic, continuous use of opioids   Lumbar spondylosis - Primary (Chronic)   Relevant Medications   HYDROcodone-acetaminophen (NORCO)  10-325 MG tablet (Start on 07/09/2020)   morphine (MS CONTIN) 60 MG 12 hr tablet (Start on 07/09/2020)   Right sided sciatica    Other Visit Diagnoses    DDD (degenerative disc disease), lumbar       Relevant Medications   HYDROcodone-acetaminophen (NORCO) 10-325 MG tablet (Start on 07/09/2020)   morphine (MS CONTIN) 60 MG 12 hr tablet (Start on 07/09/2020)   triamcinolone acetonide (KENALOG-40) injection 40 mg (Completed)   Polycystic kidney disease            ----------------------------------------------------------------------------------------------------------------------  1. Lumbar spondylosis We will proceed with a repeat epidural today.  The risks and benefits of been once again reviewed all questions answered.  We will have her return to clinic approximately 2 months for Baseline follow-up and she is instructed to contact us for potential earlier repeat epidural placement.  Continue core stretching strengthening exercises and efforts at weight  loss and continue current pain management protocol. 2. Chronic bilateral low back pain with left-sided sciatica As above with epidural today.  3. Chronic, continuous use of opioids I have reviewed the [facility] practitioner database information and it is appropriate for refills.  These will be dated for February 21 and March 23 as currently outstanding.  4. DDD (degenerative disc disease), lumbar As above and continue core stretching and strengthening  5. Polycystic kidney disease As above and continue with follow-up with her nephrology team  6. Chronic pain syndrome No change  7. Right sided sciatica     ----------------------------------------------------------------------------------------------------------------------  I am having Megan Brock. Megan Brock maintain her naloxone, Tolvaptan, cyclobenzaprine, lisinopril, amLODipine, HYDROcodone-acetaminophen, and morphine. We administered triamcinolone acetonide, sodium chloride  flush, ropivacaine (PF) 2 mg/mL (0.2%), lidocaine (PF), and iohexol.   Meds ordered this encounter  Medications  . HYDROcodone-acetaminophen (NORCO) 10-325 MG tablet    Sig: Take 1 tablet by mouth 2 (two) times daily as needed for moderate pain or severe pain.    Dispense:  45 tablet    Refill:  0  . morphine (MS CONTIN) 60 MG 12 hr tablet    Sig: Take 1 tablet (60 mg total) by mouth 3 (three) times daily.    Dispense:  75 tablet    Refill:  0  . triamcinolone acetonide (KENALOG-40) injection 40 mg  . sodium chloride flush (NS) 0.9 % injection 10 mL  . ropivacaine (PF) 2 mg/mL (0.2%) (NAROPIN) injection 10 mL  . lidocaine (PF) (XYLOCAINE) 1 % injection 5 mL  . iohexol (OMNIPAQUE) 180 MG/ML injection 10 mL   Patient's Medications  New Prescriptions   No medications on file  Previous Medications   AMLODIPINE (NORVASC) 5 MG TABLET    TAKE 1 TABLET(5 MG) BY MOUTH DAILY   CYCLOBENZAPRINE (FLEXERIL) 10 MG TABLET    TAKE 1 TABLET(10 MG) BY MOUTH TWICE DAILY   LISINOPRIL (ZESTRIL) 10 MG TABLET    TAKE 1 TABLET(10 MG) BY MOUTH DAILY   NALOXONE (NARCAN) NASAL SPRAY 4 MG/0.1 ML    For excess sedation from opioids.   TOLVAPTAN 90 & 30 MG TBPK    Take 1 tablet by mouth 2 (two) times daily. Takes 60 mg in the AM and 30 mg 8 hours later.  Modified Medications   Modified Medication Previous Medication   HYDROCODONE-ACETAMINOPHEN (NORCO) 10-325 MG TABLET HYDROcodone-acetaminophen (NORCO) 10-325 MG tablet      Take 1 tablet by mouth 2 (two) times daily as needed for moderate pain or severe pain.    Take 1 tablet by mouth 2 (two) times daily as needed for moderate pain or severe pain.   MORPHINE (MS CONTIN) 60 MG 12 HR TABLET morphine (MS CONTIN) 60 MG 12 hr tablet      Take 1 tablet (60 mg total) by mouth 3 (three) times daily.    Take 1 tablet (60 mg total) by mouth 3 (three) times daily.  Discontinued Medications   No medications on file    ----------------------------------------------------------------------------------------------------------------------  Follow-up: Return in about 1 month (around 07/18/2020) for evaluation, procedure.   Procedure: L5-S1 LESI with fluoroscopic guidance and no moderate sedation  NOTE: The risks, benefits, and expectations of the procedure have been discussed and explained to the patient who was understanding and in agreement with suggested treatment plan. No guarantees were made.  DESCRIPTION OF PROCEDURE: Lumbar epidural steroid injection with no IV Versed, EKG, blood pressure, pulse, and pulse oximetry monitoring. The procedure was performed with the  patient in the prone position under fluoroscopic guidance.  Sterile prep x3 was initiated and I then injected subcutaneous lidocaine to the overlying L5-S1 site after its fluoroscopic identifictation.  Using strict aseptic technique, I then advanced an 18-gauge Tuohy epidural needle in the midline using interlaminar approach via loss-of-resistance to saline technique. There was negative aspiration for heme or  CSF.  I then confirmed position with both AP and Lateral fluoroscan.  2 cc of contrast dye were injected and a  total of 5 mL of Preservative-Free normal saline mixed with 40 mg of Kenalog and 1cc Ropicaine 0.2 percent were injected incrementally via the  epidurally placed needle. The needle was removed. The patient tolerated the injection well and was convalesced and discharged to home in stable condition. Should the patient have any post procedure difficulty they have been instructed on how to contact us for assistance.    Molli Barrows, MD

## 2020-07-08 ENCOUNTER — Other Ambulatory Visit: Payer: Self-pay | Admitting: Anesthesiology

## 2020-07-16 ENCOUNTER — Other Ambulatory Visit: Payer: Self-pay

## 2020-07-16 ENCOUNTER — Other Ambulatory Visit: Payer: Self-pay | Admitting: Anesthesiology

## 2020-07-16 ENCOUNTER — Encounter: Payer: Self-pay | Admitting: Anesthesiology

## 2020-07-16 ENCOUNTER — Ambulatory Visit
Admission: RE | Admit: 2020-07-16 | Discharge: 2020-07-16 | Disposition: A | Discharge status: Home / Self Care | Payer: Medicare Other | Source: Ambulatory Visit | Attending: Anesthesiology | Admitting: Anesthesiology

## 2020-07-16 ENCOUNTER — Ambulatory Visit (HOSPITAL_BASED_OUTPATIENT_CLINIC_OR_DEPARTMENT_OTHER): Payer: Medicare Other | Admitting: Anesthesiology

## 2020-07-16 VITALS — BP 134/76 | HR 99 | Temp 98.0°F | Resp 19 | Ht 64.0 in | Wt 170.0 lb

## 2020-07-16 DIAGNOSIS — M5442 Lumbago with sciatica, left side: Secondary | ICD-10-CM

## 2020-07-16 DIAGNOSIS — Q613 Polycystic kidney, unspecified: Secondary | ICD-10-CM | POA: Diagnosis present

## 2020-07-16 DIAGNOSIS — F1190 Opioid use, unspecified, uncomplicated: Secondary | ICD-10-CM | POA: Diagnosis present

## 2020-07-16 DIAGNOSIS — G8929 Other chronic pain: Secondary | ICD-10-CM | POA: Diagnosis present

## 2020-07-16 DIAGNOSIS — G894 Chronic pain syndrome: Secondary | ICD-10-CM

## 2020-07-16 DIAGNOSIS — M5431 Sciatica, right side: Secondary | ICD-10-CM

## 2020-07-16 DIAGNOSIS — R52 Pain, unspecified: Secondary | ICD-10-CM

## 2020-07-16 DIAGNOSIS — M5136 Other intervertebral disc degeneration, lumbar region: Secondary | ICD-10-CM

## 2020-07-16 DIAGNOSIS — M47816 Spondylosis without myelopathy or radiculopathy, lumbar region: Secondary | ICD-10-CM | POA: Diagnosis not present

## 2020-07-16 DIAGNOSIS — M51369 Other intervertebral disc degeneration, lumbar region without mention of lumbar back pain or lower extremity pain: Secondary | ICD-10-CM

## 2020-07-16 MED ORDER — HYDROCODONE-ACETAMINOPHEN 10-325 MG PO TABS: 1.0000 | ORAL_TABLET | Freq: Two times a day (BID) | ORAL | 0 refills | Status: AC | PRN

## 2020-07-16 MED ORDER — TRIAMCINOLONE ACETONIDE 40 MG/ML IJ SUSP
40.0000 mg | Freq: Once | INTRAMUSCULAR | Status: AC
  Administered 2020-07-16: 40 mg

## 2020-07-16 MED ORDER — SODIUM CHLORIDE (PF) 0.9 % IJ SOLN
INTRAMUSCULAR | Status: AC
  Filled 2020-07-16: qty 10

## 2020-07-16 MED ORDER — LIDOCAINE HCL (PF) 1 % IJ SOLN
INTRAMUSCULAR | Status: AC
  Filled 2020-07-16: qty 10

## 2020-07-16 MED ORDER — TRIAMCINOLONE ACETONIDE 40 MG/ML IJ SUSP
INTRAMUSCULAR | Status: AC
  Filled 2020-07-16: qty 1

## 2020-07-16 MED ORDER — IOHEXOL 180 MG/ML  SOLN
INTRAMUSCULAR | Status: AC
  Filled 2020-07-16: qty 20

## 2020-07-16 MED ORDER — SODIUM CHLORIDE 0.9% FLUSH
10.0000 mL | Freq: Once | INTRAVENOUS | Status: AC
  Administered 2020-07-16: 10 mL

## 2020-07-16 MED ORDER — ROPIVACAINE HCL 2 MG/ML IJ SOLN
10.0000 mL | Freq: Once | INTRAMUSCULAR | Status: AC
  Administered 2020-07-16: 10 mL via EPIDURAL

## 2020-07-16 MED ORDER — ROPIVACAINE HCL 2 MG/ML IJ SOLN
INTRAMUSCULAR | Status: AC
  Filled 2020-07-16: qty 10

## 2020-07-16 MED ORDER — HYDROCODONE-ACETAMINOPHEN 10-325 MG PO TABS: 1.0000 | ORAL_TABLET | Freq: Two times a day (BID) | ORAL | 0 refills | Status: DC | PRN

## 2020-07-16 MED ORDER — IOHEXOL 180 MG/ML  SOLN
10.0000 mL | Freq: Once | INTRAMUSCULAR | Status: AC | PRN
  Administered 2020-07-16: 10 mL via EPIDURAL

## 2020-07-16 MED ORDER — MORPHINE SULFATE ER 60 MG PO TBCR: 60.0000 mg | EXTENDED_RELEASE_TABLET | Freq: Three times a day (TID) | ORAL | 0 refills | Status: DC

## 2020-07-16 MED ORDER — LIDOCAINE HCL (PF) 1 % IJ SOLN
5.0000 mL | Freq: Once | INTRAMUSCULAR | Status: AC
  Administered 2020-07-16: 5 mL via SUBCUTANEOUS

## 2020-07-16 NOTE — Progress Notes (Signed)
Nursing Pain Medication Assessment:  Safety precautions to be maintained throughout the outpatient stay will include: orient to surroundings, keep bed in low position, maintain call bell within reach at all times, provide assistance with transfer out of bed and ambulation.  Medication Inspection Compliance: Pill count conducted under aseptic conditions, in front of the patient. Neither the pills nor the bottle was removed from the patient's sight at any time. Once count was completed pills were immediately returned to the patient in their original bottle.  Medication #1: Hydrocodone/APAP Pill/Patch Count: 33 of 45 pills remain Pill/Patch Appearance: Markings consistent with prescribed medication Bottle Appearance: Standard pharmacy container. Clearly labeled. Filled Date: 07/08/2020 Last Medication intake:  Today  Medication #2: Morphine IR Pill/Patch Count: 58 of 75 pills remain Pill/Patch Appearance: Markings consistent with prescribed medication Bottle Appearance: Standard pharmacy container. Clearly labeled. Filled Date: 07/08/2020 Last Medication intake:  Today

## 2020-07-16 NOTE — Progress Notes (Signed)
Subjective:  Patient ID: Megan Brock, female    DOB: 1981/02/17  Age: 40 y.o. MRN: [id number]  CC: Back Pain (lower)   Procedure: L5-S1 epidural steroid under fluoroscopic guidance with no sedation  HPI Megan Brock presents for reevaluation.  She was last seen approximately a month ago and had her first epidural of the series for her sciatica.  She has had no recurrence of left lower extremity sciatica but primarily has recurrence of right lower extremity sciatica symptoms affecting the area of the posterior lateral right leg going to about the knee and occasionally affecting the calf with the periodic numbness affecting the base of the right foot.  This right side sciatica did respond to her last epidural and is approximately 50% better than before.  She still taking her medications as prescribed and these continue to work well for her chronic low back pain.  No untoward side effects are noted in discussion today and she continues to get good relief from the opioid medications with no side effects.  The quality characteristic and distribution of her low back pain and leg pain are stable in nature.  She is trying to do her stretching strengthening exercises and the epidurals do help with that she reports.  Bowel bladder function been stable and she continues to follow-up with her nephrology doctor secondary to her polycystic kidney disease.  Outpatient Medications Prior to Visit  Medication Sig Dispense Refill  . amLODipine (NORVASC) 5 MG tablet TAKE 1 TABLET(5 MG) BY MOUTH DAILY (Patient taking differently: 10 mg.) 30 tablet 0  . cyclobenzaprine (FLEXERIL) 10 MG tablet TAKE 1 TABLET(10 MG) BY MOUTH TWICE DAILY 60 tablet 3  . lisinopril (ZESTRIL) 10 MG tablet TAKE 1 TABLET(10 MG) BY MOUTH DAILY 30 tablet 0  . naloxone (NARCAN) nasal spray 4 mg/0.1 mL For excess sedation from opioids. 1 kit 2  . Tolvaptan 90 & 30 MG TBPK Take 1 tablet by mouth 2 (two) times daily. Takes 60 mg in the AM and  30 mg 8 hours later.    Marland Kitchen HYDROcodone-acetaminophen (NORCO) 10-325 MG tablet Take 1 tablet by mouth 2 (two) times daily as needed for moderate pain or severe pain. 45 tablet 0  . morphine (MS CONTIN) 60 MG 12 hr tablet Take 1 tablet (60 mg total) by mouth 3 (three) times daily. 75 tablet 0   No facility-administered medications prior to visit.    Review of Systems CNS: No confusion or sedation Cardiac: No angina or palpitations GI: No abdominal pain or constipation Constitutional: No nausea vomiting fevers or chills  Objective:  BP 134/76   Pulse 99 Comment: nsr  Temp 98 F (36.7 C) (Temporal)   Resp 19   Ht 5' 4"  (1.626 m)   Wt 170 lb (77.1 kg)   SpO2 99%   BMI 29.18 kg/m    BP Readings from Last 3 Encounters:  07/16/20 134/76  06/17/20 (!) 138/96  05/27/20 103/78     Wt Readings from Last 3 Encounters:  07/16/20 170 lb (77.1 kg)  06/17/20 170 lb (77.1 kg)  01/10/20 170 lb (77.1 kg)     Physical Exam Pt is alert and oriented PERRL EOMI HEART IS RRR no murmur or rub LCTA no wheezing or rales MUSCULOSKELETAL reveals some paraspinous muscle tenderness in the lumbar region but no trigger points.  Her muscle tone and bulk to the lower extremities is stable without change in examination strength is good and a somewhat antalgic gait is noted.  Labs  No results found for: HGBA1C Lab Results  Component Value Date   LDLCALC 105 (H) 07/17/2015   CREATININE 0.98 02/08/2017    -------------------------------------------------------------------------------------------------------------------- Lab Results  Component Value Date   WBC 9.2 02/08/2017   HGB 13.2 02/08/2017   HCT 37.7 02/08/2017   PLT 197 02/08/2017   GLUCOSE 87 02/08/2017   CHOL 174 07/17/2015   TRIG 139 07/17/2015   HDL 41 07/17/2015   LDLCALC 105 (H) 07/17/2015   ALT 10 02/08/2017   AST 12 02/08/2017   NA 140 02/08/2017   K 4.0 02/08/2017   CL 105 02/08/2017   CREATININE 0.98 02/08/2017   BUN  18 02/08/2017   CO2 29 02/08/2017   TSH 2.530 06/26/2019    --------------------------------------------------------------------------------------------------------------------- DG PAIN CLINIC C-ARM 1-60 MIN NO REPORT  Result Date: 07/16/2020 Fluoro was used, but no Radiologist interpretation will be provided. Please refer to "NOTES" tab for provider progress note.    Assessment & Plan:   Breshae was seen today for back pain.  Diagnoses and all orders for this visit:  Lumbar spondylosis  Chronic bilateral low back pain with left-sided sciatica  Chronic, continuous use of opioids -     ToxASSURE Select 13 (MW), Urine  DDD (degenerative disc disease), lumbar  Polycystic kidney disease  Chronic pain syndrome -     ToxASSURE Select 13 (MW), Urine  Right sided sciatica  Other orders -     triamcinolone acetonide (KENALOG-40) injection 40 mg -     sodium chloride flush (NS) 0.9 % injection 10 mL -     ropivacaine (PF) 2 mg/mL (0.2%) (NAROPIN) injection 10 mL -     lidocaine (PF) (XYLOCAINE) 1 % injection 5 mL -     iohexol (OMNIPAQUE) 180 MG/ML injection 10 mL -     HYDROcodone-acetaminophen (NORCO) 10-325 MG tablet; Take 1 tablet by mouth 2 (two) times daily as needed for moderate pain or severe pain. -     HYDROcodone-acetaminophen (NORCO) 10-325 MG tablet; Take 1 tablet by mouth 2 (two) times daily as needed for moderate pain or severe pain. -     morphine (MS CONTIN) 60 MG 12 hr tablet; Take 1 tablet (60 mg total) by mouth 3 (three) times daily. -     morphine (MS CONTIN) 60 MG 12 hr tablet; Take 1 tablet (60 mg total) by mouth 3 (three) times daily.        ----------------------------------------------------------------------------------------------------------------------  Problem List Items Addressed This Visit      Unprioritized   Chronic lower back pain   Relevant Medications   HYDROcodone-acetaminophen (NORCO) 10-325 MG tablet (Start on 08/08/2020)    HYDROcodone-acetaminophen (NORCO) 10-325 MG tablet (Start on 09/06/2020)   morphine (MS CONTIN) 60 MG 12 hr tablet (Start on 08/08/2020)   morphine (MS CONTIN) 60 MG 12 hr tablet (Start on 09/06/2020)   Chronic pain syndrome (Chronic)   Relevant Medications   HYDROcodone-acetaminophen (NORCO) 10-325 MG tablet (Start on 08/08/2020)   HYDROcodone-acetaminophen (NORCO) 10-325 MG tablet (Start on 09/06/2020)   morphine (MS CONTIN) 60 MG 12 hr tablet (Start on 08/08/2020)   morphine (MS CONTIN) 60 MG 12 hr tablet (Start on 09/06/2020)   Other Relevant Orders   ToxASSURE Select 13 (MW), Urine   Chronic, continuous use of opioids   Relevant Orders   ToxASSURE Select 13 (MW), Urine   Lumbar spondylosis - Primary (Chronic)   Relevant Medications   HYDROcodone-acetaminophen (NORCO) 10-325 MG tablet (Start on 08/08/2020)   HYDROcodone-acetaminophen ([place])  10-325 MG tablet (Start on 09/06/2020)   morphine (MS CONTIN) 60 MG 12 hr tablet (Start on 08/08/2020)   morphine (MS CONTIN) 60 MG 12 hr tablet (Start on 09/06/2020)   Right sided sciatica    Other Visit Diagnoses    DDD (degenerative disc disease), lumbar       Relevant Medications   triamcinolone acetonide (KENALOG-40) injection 40 mg (Completed)   HYDROcodone-acetaminophen (NORCO) 10-325 MG tablet (Start on 08/08/2020)   HYDROcodone-acetaminophen (NORCO) 10-325 MG tablet (Start on 09/06/2020)   morphine (MS CONTIN) 60 MG 12 hr tablet (Start on 08/08/2020)   morphine (MS CONTIN) 60 MG 12 hr tablet (Start on 09/06/2020)   Polycystic kidney disease            ----------------------------------------------------------------------------------------------------------------------  1. Lumbar spondylosis   2. Chronic bilateral low back pain with right-sided sciatica We will proceed with a repeat epidural today.  The risks and benefits of been reviewed and all questions answered no guarantees made.  I want her to continue her stretching exercises as  reviewed today.  Return to clinic in 2 months for reevaluation at that time.  3. Chronic, continuous use of opioids I have reviewed the [facility] practitioner database information and is appropriate for refills.  These will be dated for April 22 and May 22 for her to opioid medications.  She has been doing well with this regimen for a extended period of time and they continue to give her good relief and allow her to remain active. - ToxASSURE Select 13 (MW), Urine  4. DDD (degenerative disc disease), lumbar As above  5. Polycystic kidney disease Continue follow-up with nephrology  6. Chronic pain syndrome As above - ToxASSURE Select 13 (MW), Urine  7. Right sided sciatica As above    ----------------------------------------------------------------------------------------------------------------------  I am having Exie Parody. Burright start on HYDROcodone-acetaminophen and morphine. I am also having her maintain her naloxone, Tolvaptan, lisinopril, amLODipine, cyclobenzaprine, HYDROcodone-acetaminophen, and morphine. We administered triamcinolone acetonide, sodium chloride flush, ropivacaine (PF) 2 mg/mL (0.2%), lidocaine (PF), and iohexol.   Meds ordered this encounter  Medications  . triamcinolone acetonide (KENALOG-40) injection 40 mg  . sodium chloride flush (NS) 0.9 % injection 10 mL  . ropivacaine (PF) 2 mg/mL (0.2%) (NAROPIN) injection 10 mL  . lidocaine (PF) (XYLOCAINE) 1 % injection 5 mL  . iohexol (OMNIPAQUE) 180 MG/ML injection 10 mL  . HYDROcodone-acetaminophen (NORCO) 10-325 MG tablet    Sig: Take 1 tablet by mouth 2 (two) times daily as needed for moderate pain or severe pain.    Dispense:  45 tablet    Refill:  0  . HYDROcodone-acetaminophen (NORCO) 10-325 MG tablet    Sig: Take 1 tablet by mouth 2 (two) times daily as needed for moderate pain or severe pain.    Dispense:  45 tablet    Refill:  0  . morphine (MS CONTIN) 60 MG 12 hr tablet    Sig: Take 1 tablet  (60 mg total) by mouth 3 (three) times daily.    Dispense:  75 tablet    Refill:  0  . morphine (MS CONTIN) 60 MG 12 hr tablet    Sig: Take 1 tablet (60 mg total) by mouth 3 (three) times daily.    Dispense:  75 tablet    Refill:  0   Patient's Medications  New Prescriptions   HYDROCODONE-ACETAMINOPHEN (NORCO) 10-325 MG TABLET    Take 1 tablet by mouth 2 (two) times daily as needed for moderate pain  or severe pain.   MORPHINE (MS CONTIN) 60 MG 12 HR TABLET    Take 1 tablet (60 mg total) by mouth 3 (three) times daily.  Previous Medications   AMLODIPINE (NORVASC) 5 MG TABLET    TAKE 1 TABLET(5 MG) BY MOUTH DAILY   CYCLOBENZAPRINE (FLEXERIL) 10 MG TABLET    TAKE 1 TABLET(10 MG) BY MOUTH TWICE DAILY   LISINOPRIL (ZESTRIL) 10 MG TABLET    TAKE 1 TABLET(10 MG) BY MOUTH DAILY   NALOXONE (NARCAN) NASAL SPRAY 4 MG/0.1 ML    For excess sedation from opioids.   TOLVAPTAN 90 & 30 MG TBPK    Take 1 tablet by mouth 2 (two) times daily. Takes 60 mg in the AM and 30 mg 8 hours later.  Modified Medications   Modified Medication Previous Medication   HYDROCODONE-ACETAMINOPHEN (NORCO) 10-325 MG TABLET HYDROcodone-acetaminophen (NORCO) 10-325 MG tablet      Take 1 tablet by mouth 2 (two) times daily as needed for moderate pain or severe pain.    Take 1 tablet by mouth 2 (two) times daily as needed for moderate pain or severe pain.   MORPHINE (MS CONTIN) 60 MG 12 HR TABLET morphine (MS CONTIN) 60 MG 12 hr tablet      Take 1 tablet (60 mg total) by mouth 3 (three) times daily.    Take 1 tablet (60 mg total) by mouth 3 (three) times daily.  Discontinued Medications   No medications on file   ----------------------------------------------------------------------------------------------------------------------  Follow-up: Return in about 2 months (around 09/15/2020) for evaluation, med refill.  Of Procedure: L5-S1 LESI with fluoroscopic guidance and no moderate sedation  NOTE: The risks, benefits, and  expectations of the procedure have been discussed and explained to the patient who was understanding and in agreement with suggested treatment plan. No guarantees were made.  DESCRIPTION OF PROCEDURE: Lumbar epidural steroid injection with no IV Versed, EKG, blood pressure, pulse, and pulse oximetry monitoring. The procedure was performed with the patient in the prone position under fluoroscopic guidance.  Sterile prep x3 was initiated and I then injected subcutaneous lidocaine to the overlying L5-S1 site after its fluoroscopic identifictation.  Using strict aseptic technique, I then advanced an 18-gauge Tuohy epidural needle in the midline using interlaminar approach via loss-of-resistance to saline technique. There was negative aspiration for heme or  CSF.  I then confirmed position with both AP and Lateral fluoroscan.  2 cc of contrast dye were injected and a  total of 5 mL of Preservative-Free normal saline mixed with 40 mg of Kenalog and 1cc Ropicaine 0.2 percent were injected incrementally via the  epidurally placed needle. The needle was removed. The patient tolerated the injection well and was convalesced and discharged to home in stable condition. Should the patient have any post procedure difficulty they have been instructed on how to contact us for assistance.    Molli Barrows, MD

## 2020-07-17 ENCOUNTER — Telehealth: Payer: Self-pay | Admitting: *Deleted

## 2020-07-17 NOTE — Telephone Encounter (Signed)
Voicemail left with patient post procedure, asked to call if there are questions or concerns.  

## 2020-07-19 DIAGNOSIS — F339 Major depressive disorder, recurrent, unspecified: Secondary | ICD-10-CM | POA: Diagnosis not present

## 2020-07-21 DIAGNOSIS — N200 Calculus of kidney: Secondary | ICD-10-CM | POA: Diagnosis not present

## 2020-07-21 DIAGNOSIS — Q612 Polycystic kidney, adult type: Secondary | ICD-10-CM | POA: Diagnosis not present

## 2020-07-21 DIAGNOSIS — I10 Essential (primary) hypertension: Secondary | ICD-10-CM | POA: Diagnosis not present

## 2020-07-21 DIAGNOSIS — R809 Proteinuria, unspecified: Secondary | ICD-10-CM | POA: Diagnosis not present

## 2020-07-21 LAB — TOXASSURE SELECT 13 (MW), URINE

## 2020-08-14 ENCOUNTER — Telehealth: Payer: Medicare Other | Admitting: Physician Assistant

## 2020-08-14 DIAGNOSIS — J069 Acute upper respiratory infection, unspecified: Secondary | ICD-10-CM

## 2020-08-14 MED ORDER — BENZONATATE 100 MG PO CAPS: 100.0000 mg | ORAL_CAPSULE | Freq: Three times a day (TID) | ORAL | 0 refills | Status: DC | PRN

## 2020-08-14 MED ORDER — AZELASTINE HCL 0.1 % NA SOLN: 1.0000 | Freq: Two times a day (BID) | NASAL | 0 refills | Status: DC

## 2020-08-14 NOTE — Progress Notes (Signed)
Virtual Visit via Video   I connected with patient on 08/14/20 at  2:00 PM EDT by a video enabled telemedicine application and verified that I am speaking with the correct person using two identifiers.  Location patient: Home Location provider: [place] participating in the virtual visit: Patient, Provider  I discussed the limitations of evaluation and management by telemedicine and the availability of in person appointments. The patient expressed understanding and agreed to proceed.  Subjective:   HPI: Patient presents via [facility] today c/o I have been congested in my head for about 3 days now, development severe left eat pain and pressure yesterday and coughing. Left ear hurts worse when she's laying down. Endorses congestion.  She has taken Sudafed, which has helped relieve some pressure, but not all. She has a humidifier in her room at night  Had 2 negative covid tests. Denies fever, chills, chest congestion, wheezing, shob.   ROS:   See pertinent positives and negatives per HPI.  Patient Active Problem List   Diagnosis Date Noted  . COVID-19 virus infection 05/2020  . Right sided sciatica 11/13/2019  . Tobacco abuse 01/06/2017  . Hypertension 12/08/2016  . Long term current use of opiate analgesic 10/11/2016  . Chronic pain syndrome 10/11/2016  . Lumbar spondylosis 10/11/2016  . Kidney stone 07/16/2015  . Anxiety 07/16/2015  . Hepatitis   . Chronic lower back pain 09/18/2014  . Chronic, continuous use of opioids 09/18/2014  . Polycystic kidney, autosomal dominant 08/03/2012  . Disorder of calcium metabolism 11/12/2011  . Malaise and fatigue 11/03/2011  . Gross hematuria 12/25/2007    Social History   Tobacco Use  . Smoking status: Current Every Day Smoker    Packs/day: 0.50    Types: Cigarettes  . Smokeless tobacco: Never Used  . Tobacco comment: 1/2-1 pd since age 74  Substance Use Topics  . Alcohol use: No    Alcohol/week:  0.0 standard drinks    Current Outpatient Medications:  .  amLODipine (NORVASC) 5 MG tablet, TAKE 1 TABLET(5 MG) BY MOUTH DAILY (Patient taking differently: 10 mg.), Disp: 30 tablet, Rfl: 0 .  cyclobenzaprine (FLEXERIL) 10 MG tablet, TAKE 1 TABLET(10 MG) BY MOUTH TWICE DAILY, Disp: 60 tablet, Rfl: 3 .  HYDROcodone-acetaminophen (NORCO) 10-325 MG tablet, Take 1 tablet by mouth 2 (two) times daily as needed for moderate pain or severe pain., Disp: 45 tablet, Rfl: 0 .  [START ON 09/06/2020] HYDROcodone-acetaminophen (NORCO) 10-325 MG tablet, Take 1 tablet by mouth 2 (two) times daily as needed for moderate pain or severe pain., Disp: 45 tablet, Rfl: 0 .  lisinopril (ZESTRIL) 10 MG tablet, TAKE 1 TABLET(10 MG) BY MOUTH DAILY, Disp: 30 tablet, Rfl: 0 .  morphine (MS CONTIN) 60 MG 12 hr tablet, Take 1 tablet (60 mg total) by mouth 3 (three) times daily., Disp: 75 tablet, Rfl: 0 .  [START ON 09/06/2020] morphine (MS CONTIN) 60 MG 12 hr tablet, Take 1 tablet (60 mg total) by mouth 3 (three) times daily., Disp: 75 tablet, Rfl: 0 .  naloxone (NARCAN) nasal spray 4 mg/0.1 mL, For excess sedation from opioids., Disp: 1 kit, Rfl: 2 .  Tolvaptan 90 & 30 MG TBPK, Take 1 tablet by mouth 2 (two) times daily. Takes 60 mg in the AM and 30 mg 8 hours later., Disp: , Rfl:   No Known Allergies  Objective:   There were no vitals taken for this visit.  Patient is well-developed, well-nourished in no  acute distress.  Resting comfortably at home.  Head is normocephalic, atraumatic.  No labored breathing.  Speech is clear and coherent with logical content.  Patient is alert and oriented at baseline.    Assessment and Plan:   Viral URI. Con't Tylenol, plain Mucinex. Start using saline nasal spray, Echinacea, warm tea with honey, lemon and ginger.  Rx for  Azelastine nasal spray two sprays in each nostril twice a day, Tessalon Perles 100 mg. You may take 1-2 capsules every 8 hours as needed for cough  Call with  worsening symptoms.   Marlin Canary, PA-C [facility] 08/14/2020

## 2020-08-23 ENCOUNTER — Encounter: Payer: Self-pay | Admitting: Nurse Practitioner

## 2020-08-23 ENCOUNTER — Telehealth: Payer: Medicare Other | Admitting: Nurse Practitioner

## 2020-08-23 DIAGNOSIS — J0100 Acute maxillary sinusitis, unspecified: Secondary | ICD-10-CM

## 2020-08-23 MED ORDER — AMOXICILLIN-POT CLAVULANATE 875-125 MG PO TABS: 1.0000 | ORAL_TABLET | Freq: Two times a day (BID) | ORAL | 0 refills | Status: DC

## 2020-08-23 MED ORDER — PREDNISONE 20 MG PO TABS
40.0000 mg | ORAL_TABLET | Freq: Every day | ORAL | 0 refills | Status: AC
Start: 2020-08-23 — End: 2020-08-28

## 2020-08-23 NOTE — Progress Notes (Signed)
Ms. Megan, Brock are scheduled for a virtual visit with your provider today.    Just as we do with appointments in the office, we must obtain your consent to participate.  Your consent will be active for this visit and any virtual visit you may have with one of our providers in the next 365 days.    If you have a MyChart account, I can also send a copy of this consent to you electronically.  All virtual visits are billed to your insurance company just like a traditional visit in the office.  As this is a virtual visit, video technology does not allow for your provider to perform a traditional examination.  This may limit your provider's ability to fully assess your condition.  If your provider identifies any concerns that need to be evaluated in person or the need to arrange testing such as labs, EKG, etc, we will make arrangements to do so.    Although advances in technology are sophisticated, we cannot ensure that it will always work on either your end or our end.  If the connection with a video visit is poor, we may have to switch to a telephone visit.  With either a video or telephone visit, we are not always able to ensure that we have a secure connection.   I need to obtain your verbal consent now.   Are you willing to proceed with your visit today?   Megan Brock has provided verbal consent on 08/23/2020 for a virtual visit (video or telephone).  Virtual Visit via Video  I connected with  Megan Brock  on 08/23/20 at 4:40 by video and verified that I am speaking with the correct person using two identifiers. Megan Brock is currently located at home and no one is currently with her during visit. The provider, Mary-Margaret Hassell Done, FNP is located at home at time of visit.  I discussed the limitations, risks, security and privacy concerns of performing an evaluation and management service by video  and the availability of in person appointments. I also discussed with the patient that there may  be a patient responsible charge related to this service. The patient expressed understanding and agreed to proceed.   Subjective:   HPI: Patient states she was seen on April 28th and was given a nasal spray and cough drops, her cough is better but everything is still the same in her head, still congested, left ear is still hurting off and on and is 'cloudy' hearing so to speak, when she bend over it feels as if water is going up my nose and head, burning feeling. SHe have gone through 2 boxes of sudafed and now on a box of mucinex sinus pressure and pain    Review of Systems  Constitutional: Negative for chills and fever.  HENT: Positive for congestion, ear pain (left) and sinus pain. Negative for sore throat.   Respiratory: Positive for cough (better).   Neurological: Positive for dizziness and headaches.  Psychiatric/Behavioral: Negative.      See pertinent positives and negatives per HPI.  Patient Active Problem List   Diagnosis Date Noted  . COVID-19 virus infection 05/2020  . Right sided sciatica 11/13/2019  . Tobacco abuse 01/06/2017  . Hypertension 12/08/2016  . Long term current use of opiate analgesic 10/11/2016  . Chronic pain syndrome 10/11/2016  . Lumbar spondylosis 10/11/2016  . Kidney stone 07/16/2015  . Anxiety 07/16/2015  . Hepatitis   . Chronic lower back pain 09/18/2014  .  Chronic, continuous use of opioids 09/18/2014  . Polycystic kidney, autosomal dominant 08/03/2012  . Disorder of calcium metabolism 11/12/2011  . Malaise and fatigue 11/03/2011  . Gross hematuria 12/25/2007    Social History   Tobacco Use  . Smoking status: Current Every Day Smoker    Packs/day: 0.50    Types: Cigarettes  . Smokeless tobacco: Never Used  . Tobacco comment: 1/2-1 pd since age 78  Substance Use Topics  . Alcohol use: No    Alcohol/week: 0.0 standard drinks    Current Outpatient Medications:  .  amLODipine (NORVASC) 5 MG tablet, TAKE 1 TABLET(5 MG) BY MOUTH DAILY  (Patient taking differently: 10 mg.), Disp: 30 tablet, Rfl: 0 .  azelastine (ASTELIN) 0.1 % nasal spray, Place 1 spray into both nostrils 2 (two) times daily. Use in each nostril as directed, Disp: 30 mL, Rfl: 0 .  benzonatate (TESSALON) 100 MG capsule, Take 1-2 capsules (100-200 mg total) by mouth 3 (three) times daily as needed for cough., Disp: 40 capsule, Rfl: 0 .  cyclobenzaprine (FLEXERIL) 10 MG tablet, TAKE 1 TABLET(10 MG) BY MOUTH TWICE DAILY, Disp: 60 tablet, Rfl: 3 .  HYDROcodone-acetaminophen (NORCO) 10-325 MG tablet, Take 1 tablet by mouth 2 (two) times daily as needed for moderate pain or severe pain., Disp: 45 tablet, Rfl: 0 .  [START ON 09/06/2020] HYDROcodone-acetaminophen (NORCO) 10-325 MG tablet, Take 1 tablet by mouth 2 (two) times daily as needed for moderate pain or severe pain., Disp: 45 tablet, Rfl: 0 .  lisinopril (ZESTRIL) 10 MG tablet, TAKE 1 TABLET(10 MG) BY MOUTH DAILY, Disp: 30 tablet, Rfl: 0 .  morphine (MS CONTIN) 60 MG 12 hr tablet, Take 1 tablet (60 mg total) by mouth 3 (three) times daily., Disp: 75 tablet, Rfl: 0 .  [START ON 09/06/2020] morphine (MS CONTIN) 60 MG 12 hr tablet, Take 1 tablet (60 mg total) by mouth 3 (three) times daily., Disp: 75 tablet, Rfl: 0 .  naloxone (NARCAN) nasal spray 4 mg/0.1 mL, For excess sedation from opioids., Disp: 1 kit, Rfl: 2 .  Tolvaptan 90 & 30 MG TBPK, Take 1 tablet by mouth 2 (two) times daily. Takes 60 mg in the AM and 30 mg 8 hours later., Disp: , Rfl:   No Known Allergies  Objective:   There were no vitals taken for this visit.  Patient is well-developed, well-nourished in no acute distress.  Resting comfortably at home.  Head is normocephalic, atraumatic.  No labored breathing.  Speech is clear and coherent with logical content.  Patient is alert and oriented at baseline.  Facial pressure on left No pain with wiggling or touching ear  Assessment and Plan:    Megan Brock in today with chief complaint of No  chief complaint on file.   1. Acute non-recurrent maxillary sinusitis 1. Take meds as prescribed 2. Use a cool mist humidifier especially during the winter months and when heat has been humid. 3. Use saline nose sprays frequently 4. Saline irrigations of the nose can be very helpful if done frequently.  * 4X daily for 1 week*  * Use of a nettie pot can be helpful with this. Follow directions with this* 5. Drink plenty of fluids 6. Keep thermostat turn down low 7.For any cough or congestion  Use plain Mucinex- regular strength or max strength is fine   * Children- consult with Pharmacist for dosing 8. For fever or aces or pains- take tylenol or ibuprofen appropriate for age and weight.  *  for fevers greater than 101 orally you may alternate ibuprofen and tylenol every  3 hours.    - amoxicillin-clavulanate (AUGMENTIN) 875-125 MG tablet; Take 1 tablet by mouth 2 (two) times daily.  Dispense: 14 tablet; Refill: 0 - predniSONE (DELTASONE) 20 MG tablet; Take 2 tablets (40 mg total) by mouth daily with breakfast for 5 days. 2 po daily for 5 days  Dispense: 10 tablet; Refill: 0    The above assessment and management plan was discussed with the patient. The patient verbalized understanding of and has agreed to the management plan. Patient is aware to call the clinic if symptoms persist or worsen. Patient is aware when to return to the clinic for a follow-up visit. Patient educated on when it is appropriate to go to the emergency department.   Mary-Margaret Hassell Done, [place], [place] 08/23/2020  Time spent with the patient: 17 minutes, of which >50% was spent in obtaining information about symptoms, reviewing previous labs, evaluations, and treatments, counseling about condition (please see the discussed topics above), and developing a plan to further investigate it; had a number of questions which I addressed.

## 2020-09-02 ENCOUNTER — Other Ambulatory Visit: Payer: Self-pay | Admitting: Family Medicine

## 2020-09-02 NOTE — Telephone Encounter (Signed)
Requested medication (s) are due for refill today: No  Requested medication (s) are on the active medication list: Yes  Last refill:  09/02/20  Future visit scheduled: No  Notes to clinic:  Requesting 90 day supply.    Requested Prescriptions  Pending Prescriptions Disp Refills   lisinopril (ZESTRIL) 10 MG tablet [Pharmacy Med Name: LISINOPRIL 10MG  TABLETS] 90 tablet     Sig: TAKE 1 TABLET(10 MG) BY MOUTH DAILY      Cardiovascular:  ACE Inhibitors Failed - 09/02/2020  3:13 PM      Failed - Cr in normal range and within 180 days    Creat  Date Value Ref Range Status  02/08/2017 0.98 0.50 - 1.10 mg/dL Final          Failed - K in normal range and within 180 days    Potassium  Date Value Ref Range Status  02/08/2017 4.0 3.5 - 5.3 mmol/L Final          Failed - Valid encounter within last 6 months    Recent Outpatient Visits           1 year ago Medicare annual wellness visit, subsequent   [facility], [place]   2 years ago Hypertension secondary to other renal disorders   [facility], MD   3 years ago Acute otitis media, unspecified otitis media type   [facility], MD   3 years ago Hypertension secondary to other renal disorders   [facility], MD   3 years ago Hypertension secondary to other renal disorders   [facility], [facility], MD       Future Appointments             In 1 week Demetrios Isaacs, Pernell Dupre, MD [facility]   In 2 months [facility], MD [facility]             Passed - Patient is not pregnant      Passed - Last BP in normal range    BP Readings from Last 1 Encounters:  07/16/20 134/76

## 2020-09-11 ENCOUNTER — Ambulatory Visit: Payer: Medicare Other | Attending: Anesthesiology | Admitting: Anesthesiology

## 2020-09-11 ENCOUNTER — Encounter: Payer: Self-pay | Admitting: Anesthesiology

## 2020-09-11 ENCOUNTER — Other Ambulatory Visit: Payer: Self-pay

## 2020-09-11 ENCOUNTER — Telehealth: Payer: Self-pay | Admitting: Anesthesiology

## 2020-09-11 DIAGNOSIS — G8929 Other chronic pain: Secondary | ICD-10-CM

## 2020-09-11 DIAGNOSIS — M5432 Sciatica, left side: Secondary | ICD-10-CM

## 2020-09-11 DIAGNOSIS — M5136 Other intervertebral disc degeneration, lumbar region: Secondary | ICD-10-CM

## 2020-09-11 DIAGNOSIS — M5442 Lumbago with sciatica, left side: Secondary | ICD-10-CM

## 2020-09-11 DIAGNOSIS — G894 Chronic pain syndrome: Secondary | ICD-10-CM

## 2020-09-11 DIAGNOSIS — M47816 Spondylosis without myelopathy or radiculopathy, lumbar region: Secondary | ICD-10-CM | POA: Diagnosis not present

## 2020-09-11 DIAGNOSIS — M51369 Other intervertebral disc degeneration, lumbar region without mention of lumbar back pain or lower extremity pain: Secondary | ICD-10-CM

## 2020-09-11 DIAGNOSIS — F1190 Opioid use, unspecified, uncomplicated: Secondary | ICD-10-CM | POA: Diagnosis not present

## 2020-09-11 DIAGNOSIS — Q613 Polycystic kidney, unspecified: Secondary | ICD-10-CM

## 2020-09-11 MED ORDER — MORPHINE SULFATE ER 60 MG PO TBCR: 60.0000 mg | EXTENDED_RELEASE_TABLET | Freq: Three times a day (TID) | ORAL | 0 refills | Status: DC

## 2020-09-11 MED ORDER — HYDROCODONE-ACETAMINOPHEN 10-325 MG PO TABS: 1.0000 | ORAL_TABLET | Freq: Two times a day (BID) | ORAL | 0 refills | Status: AC | PRN

## 2020-09-11 MED ORDER — HYDROCODONE-ACETAMINOPHEN 10-325 MG PO TABS: 1.0000 | ORAL_TABLET | Freq: Four times a day (QID) | ORAL | 0 refills | Status: DC | PRN

## 2020-09-11 NOTE — Progress Notes (Signed)
Virtual Visit via Telephone Note  I connected with Megan Brock on 09/11/20 at  9:30 AM EDT by telephone and verified that I am speaking with the correct person using two identifiers.  Location: Patient: Home Provider: [facility] center   I discussed the limitations, risks, security and privacy concerns of performing an evaluation and management service by telephone and the availability of in person appointments. I also discussed with the patient that there may be a patient responsible charge related to this service. The patient expressed understanding and agreed to proceed.   History of Present Illness: I spoke with Megan Brock today via telephone as we were unable to link for the video portion of virtual conference and she reports that she is done quite well following her last epidural.  Her left posterior lateral leg sciatica symptoms have improved approximately 75%.  She is doing her stretching strengthening exercises as tolerated and continue take her pain medications as needed.  She takes her MS Contin approximately 2-3 times a day and her hydrocodone 1-2 times a day for breakthrough pain.  This combination has worked successfully for her for an extended period time and keeps her pain under reasonable control allows her to function reasonably well and sleep better.  No side effects reported.  Her bowel and bladder function are stable in nature.  Otherwise she is in her usual state of health.   Observations/Objective:   Current Outpatient Medications:  .  [START ON 11/04/2020] HYDROcodone-acetaminophen (NORCO) 10-325 MG tablet, Take 1 tablet by mouth every 6 (six) hours as needed for moderate pain or severe pain., Disp: 45 tablet, Rfl: 0 .  [START ON 11/04/2020] morphine (MS CONTIN) 60 MG 12 hr tablet, Take 1 tablet (60 mg total) by mouth 3 (three) times daily., Disp: 75 tablet, Rfl: 0 .  amLODipine (NORVASC) 5 MG tablet, TAKE 1 TABLET(5 MG) BY MOUTH DAILY (Patient taking differently: 10 mg.),  Disp: 30 tablet, Rfl: 0 .  amoxicillin-clavulanate (AUGMENTIN) 875-125 MG tablet, Take 1 tablet by mouth 2 (two) times daily., Disp: 14 tablet, Rfl: 0 .  azelastine (ASTELIN) 0.1 % nasal spray, Place 1 spray into both nostrils 2 (two) times daily. Use in each nostril as directed, Disp: 30 mL, Rfl: 0 .  benzonatate (TESSALON) 100 MG capsule, Take 1-2 capsules (100-200 mg total) by mouth 3 (three) times daily as needed for cough., Disp: 40 capsule, Rfl: 0 .  cyclobenzaprine (FLEXERIL) 10 MG tablet, TAKE 1 TABLET(10 MG) BY MOUTH TWICE DAILY, Disp: 60 tablet, Rfl: 3 .  [START ON 10/04/2020] HYDROcodone-acetaminophen (NORCO) 10-325 MG tablet, Take 1 tablet by mouth 2 (two) times daily as needed for moderate pain or severe pain., Disp: 45 tablet, Rfl: 0 .  lisinopril (ZESTRIL) 10 MG tablet, TAKE 1 TABLET(10 MG) BY MOUTH DAILY, Disp: 30 tablet, Rfl: 0 .  lisinopril (ZESTRIL) 10 MG tablet, TAKE 1 TABLET(10 MG) BY MOUTH DAILY, Disp: 30 tablet, Rfl: 0 .  [START ON 10/04/2020] morphine (MS CONTIN) 60 MG 12 hr tablet, Take 1 tablet (60 mg total) by mouth 3 (three) times daily., Disp: 75 tablet, Rfl: 0 .  naloxone (NARCAN) nasal spray 4 mg/0.1 mL, For excess sedation from opioids., Disp: 1 kit, Rfl: 2 .  Tolvaptan 90 & 30 MG TBPK, Take 1 tablet by mouth 2 (two) times daily. Takes 60 mg in the AM and 30 mg 8 hours later., Disp: , Rfl:  Assessment and Plan:  1. Lumbar spondylosis   2. Chronic bilateral low back pain  with left-sided sciatica   3. Chronic, continuous use of opioids   4. DDD (degenerative disc disease), lumbar   5. Polycystic kidney disease   6. Chronic pain syndrome   7. Left sided sciatica   Based on our discussion today and upon review of the [facility] practitioner database information I think it is appropriate to refill her medicines and these will be dated for June and July 19.  No changes in her pain management medication protocol will be initiated.  She is to continue taking Flexeril as  needed for lumbar spasm and posterior lateral leg pain.  Continue stretching strengthening exercises as reviewed and continue follow-up with her primary care physicians and nephrologist for her polycystic kidney disease.  She will be scheduled for 40-monthreturn to clinic. Follow Up Instructions:    I discussed the assessment and treatment plan with the patient. The patient was provided an opportunity to ask questions and all were answered. The patient agreed with the plan and demonstrated an understanding of the instructions.   The patient was advised to call back or seek an in-person evaluation if the symptoms worsen or if the condition fails to improve as anticipated.  I provided 30 minutes of non-face-to-face time during this encounter.   JMolli Barrows MD

## 2020-09-11 NOTE — Telephone Encounter (Signed)
Patient calling wanting to know when she will be called for her appt today

## 2020-10-04 DIAGNOSIS — F339 Major depressive disorder, recurrent, unspecified: Secondary | ICD-10-CM | POA: Diagnosis not present

## 2020-10-16 DIAGNOSIS — R809 Proteinuria, unspecified: Secondary | ICD-10-CM | POA: Diagnosis not present

## 2020-10-16 DIAGNOSIS — N200 Calculus of kidney: Secondary | ICD-10-CM | POA: Diagnosis not present

## 2020-10-16 DIAGNOSIS — Q612 Polycystic kidney, adult type: Secondary | ICD-10-CM | POA: Diagnosis not present

## 2020-10-16 DIAGNOSIS — I10 Essential (primary) hypertension: Secondary | ICD-10-CM | POA: Diagnosis not present

## 2020-10-28 DIAGNOSIS — N200 Calculus of kidney: Secondary | ICD-10-CM | POA: Diagnosis not present

## 2020-10-28 DIAGNOSIS — I10 Essential (primary) hypertension: Secondary | ICD-10-CM | POA: Diagnosis not present

## 2020-10-28 DIAGNOSIS — Q612 Polycystic kidney, adult type: Secondary | ICD-10-CM | POA: Diagnosis not present

## 2020-10-29 ENCOUNTER — Other Ambulatory Visit: Payer: Self-pay | Admitting: Family Medicine

## 2020-10-29 ENCOUNTER — Other Ambulatory Visit: Payer: Self-pay | Admitting: Anesthesiology

## 2020-11-10 ENCOUNTER — Other Ambulatory Visit: Payer: Self-pay

## 2020-11-10 ENCOUNTER — Encounter: Payer: Self-pay | Admitting: Anesthesiology

## 2020-11-10 ENCOUNTER — Ambulatory Visit: Payer: Medicare Other | Attending: Anesthesiology | Admitting: Anesthesiology

## 2020-11-10 DIAGNOSIS — M47816 Spondylosis without myelopathy or radiculopathy, lumbar region: Secondary | ICD-10-CM | POA: Diagnosis not present

## 2020-11-10 DIAGNOSIS — F1190 Opioid use, unspecified, uncomplicated: Secondary | ICD-10-CM

## 2020-11-10 DIAGNOSIS — Q613 Polycystic kidney, unspecified: Secondary | ICD-10-CM

## 2020-11-10 DIAGNOSIS — M5136 Other intervertebral disc degeneration, lumbar region: Secondary | ICD-10-CM

## 2020-11-10 DIAGNOSIS — M5442 Lumbago with sciatica, left side: Secondary | ICD-10-CM

## 2020-11-10 DIAGNOSIS — M5432 Sciatica, left side: Secondary | ICD-10-CM

## 2020-11-10 DIAGNOSIS — G894 Chronic pain syndrome: Secondary | ICD-10-CM

## 2020-11-10 DIAGNOSIS — G8929 Other chronic pain: Secondary | ICD-10-CM

## 2020-11-10 MED ORDER — CYCLOBENZAPRINE HCL 10 MG PO TABS: ORAL_TABLET | ORAL | 3 refills | Status: DC

## 2020-11-10 MED ORDER — MORPHINE SULFATE ER 60 MG PO TBCR: 60.0000 mg | EXTENDED_RELEASE_TABLET | Freq: Three times a day (TID) | ORAL | 0 refills | Status: DC

## 2020-11-10 MED ORDER — HYDROCODONE-ACETAMINOPHEN 10-325 MG PO TABS: 1.0000 | ORAL_TABLET | Freq: Four times a day (QID) | ORAL | 0 refills | Status: AC | PRN

## 2020-11-10 MED ORDER — HYDROCODONE-ACETAMINOPHEN 10-325 MG PO TABS: 1.0000 | ORAL_TABLET | Freq: Four times a day (QID) | ORAL | 0 refills | Status: DC | PRN

## 2020-11-10 NOTE — Progress Notes (Signed)
Virtual Visit via Telephone Note  I connected with [name] on 11/10/20 at  1:15 PM EDT by telephone and verified that I am speaking with the correct person using two identifiers.  Location: Patient: Home    Provider: Pain Center   I discussed the limitations, risks, security and privacy concerns of performing an evaluation and management service by telephone and the availability of in person appointments. I also discussed with the patient that there may be a patient responsible charge related to this service. The patient expressed understanding and agreed to proceed.   History of Present Illness: I was able to speak with [name] via telephone as we were unable to link for the video portion of the virtual conference.  She states that she is doing reasonably well following her most recent epidural steroid injection several months ago.  She is getting a little bit of intermittent left posterior lateral leg sciatica but this is generally temporary and dependent on how active she is.  She continues to have her baseline low back pain consistent with her polycystic kidney disease symptoms.  She takes her medications as prescribed and these are continuing to work well for her and keeping her pain under good control.  She denies any side effects with the medications and overall derives good functional lifestyle improvement with them without change.  She continues to follow-up with her nephrology doctors for management of the polycystic kidney disease.  Otherwise no changes are reported today  Review of systems: General: No fevers or chills Pulmonary: No shortness of breath or dyspnea Cardiac: No angina or palpitations or lightheadedness GI: No abdominal pain or constipation Psych: No depression    Observations/Objective:   Current Outpatient Medications:    [START ON 01/02/2021] HYDROcodone-acetaminophen (NORCO) 10-325 MG tablet, Take 1 tablet by mouth every 6 (six) hours as needed for moderate  pain or severe pain., Disp: 45 tablet, Rfl: 0   [START ON 01/02/2021] morphine (MS CONTIN) 60 MG 12 hr tablet, Take 1 tablet (60 mg total) by mouth 3 (three) times daily., Disp: 75 tablet, Rfl: 0   amLODipine (NORVASC) 5 MG tablet, TAKE 1 TABLET(5 MG) BY MOUTH DAILY (Patient taking differently: 10 mg.), Disp: 30 tablet, Rfl: 0   amoxicillin-clavulanate (AUGMENTIN) 875-125 MG tablet, Take 1 tablet by mouth 2 (two) times daily., Disp: 14 tablet, Rfl: 0   azelastine (ASTELIN) 0.1 % nasal spray, Place 1 spray into both nostrils 2 (two) times daily. Use in each nostril as directed, Disp: 30 mL, Rfl: 0   benzonatate (TESSALON) 100 MG capsule, Take 1-2 capsules (100-200 mg total) by mouth 3 (three) times daily as needed for cough., Disp: 40 capsule, Rfl: 0   cyclobenzaprine (FLEXERIL) 10 MG tablet, TAKE 1 TABLET(10 MG) BY MOUTH TWICE DAILY, Disp: 60 tablet, Rfl: 3   [START ON 12/03/2020] HYDROcodone-acetaminophen (NORCO) 10-325 MG tablet, Take 1 tablet by mouth every 6 (six) hours as needed for moderate pain or severe pain., Disp: 45 tablet, Rfl: 0   lisinopril (ZESTRIL) 10 MG tablet, TAKE 1 TABLET(10 MG) BY MOUTH DAILY, Disp: 30 tablet, Rfl: 0   [START ON 12/03/2020] morphine (MS CONTIN) 60 MG 12 hr tablet, Take 1 tablet (60 mg total) by mouth 3 (three) times daily., Disp: 75 tablet, Rfl: 0   naloxone (NARCAN) nasal spray 4 mg/0.1 mL, For excess sedation from opioids., Disp: 1 kit, Rfl: 2   Tolvaptan 90 & 30 MG TBPK, Take 1 tablet by mouth 2 (two) times daily. Takes 60  mg in the AM and 30 mg 8 hours later., Disp: , Rfl:   Assessment and Plan: 1. Lumbar spondylosis   2. Chronic bilateral low back pain with left-sided sciatica   3. Chronic, continuous use of opioids   4. DDD (degenerative disc disease), lumbar   5. Polycystic kidney disease   6. Chronic pain syndrome   7. Left sided sciatica   Based on her discussion today and upon review of the [facility] practitioner database information I think is  appropriate to refill her medications for August 17 and September 16.  No other changes will be initiated in her pain management protocol.  Likewise I will refill her Flexeril.  She is taking this for low back spasms and this works well.  She did have a recent urine screen back in March which was appropriate as well.  She takes her hydrocodone on an as-needed basis.  No other changes are initiated today and I have encouraged her to continue follow-up with her primary care physicians and nephrology doctors for baseline care with a schedule return to clinic in 2 months.  Follow Up Instructions:    I discussed the assessment and treatment plan with the patient. The patient was provided an opportunity to ask questions and all were answered. The patient agreed with the plan and demonstrated an understanding of the instructions.   The patient was advised to call back or seek an in-person evaluation if the symptoms worsen or if the condition fails to improve as anticipated.  I provided 25 minutes of non-face-to-face time during this encounter.   Molli Barrows, MD

## 2020-11-21 ENCOUNTER — Other Ambulatory Visit: Payer: Self-pay | Admitting: *Deleted

## 2020-11-21 DIAGNOSIS — N200 Calculus of kidney: Secondary | ICD-10-CM

## 2020-11-25 ENCOUNTER — Ambulatory Visit
Admission: RE | Admit: 2020-11-25 | Discharge: 2020-11-25 | Disposition: A | Discharge status: Home / Self Care | Payer: Medicare Other | Attending: Urology | Admitting: Urology

## 2020-11-25 ENCOUNTER — Other Ambulatory Visit: Payer: Self-pay

## 2020-11-25 ENCOUNTER — Ambulatory Visit
Admission: RE | Admit: 2020-11-25 | Discharge: 2020-11-25 | Disposition: A | Discharge status: Home / Self Care | Payer: Medicare Other | Source: Ambulatory Visit | Attending: Urology | Admitting: Urology

## 2020-11-25 ENCOUNTER — Ambulatory Visit (INDEPENDENT_AMBULATORY_CARE_PROVIDER_SITE_OTHER): Payer: Medicare Other | Admitting: Urology

## 2020-11-25 ENCOUNTER — Encounter: Payer: Self-pay | Admitting: Urology

## 2020-11-25 VITALS — BP 128/88 | HR 108 | Ht 64.0 in | Wt 170.0 lb

## 2020-11-25 DIAGNOSIS — R350 Frequency of micturition: Secondary | ICD-10-CM | POA: Diagnosis not present

## 2020-11-25 DIAGNOSIS — N200 Calculus of kidney: Secondary | ICD-10-CM

## 2020-11-25 DIAGNOSIS — Q613 Polycystic kidney, unspecified: Secondary | ICD-10-CM

## 2020-11-25 NOTE — Progress Notes (Signed)
11/25/2020 9:56 AM   Megan Brock [birth year] [id number]  Referring provider: Birdie Sons, MD [address] [place],  [place] [address]  Chief Complaint  Patient presents with   Polycystic kidney disease    HPI: 40 year old female with a personal history of polycystic kidney disease and nephrolithiasis returns today for 2-year follow-up with KUB.  KUB today shows stable stone burden, punctate stone in the left lower side as well as punctate stone on the right.  No obvious change in her overall stone burden as compared to last year.  These images were personally reviewed today and radiologic interpretation is pending.  Today, she reports that she has been doing well.  Has been working with pain management with epidural injections for her severe sciatica.  She denies any overt flank pain.  No dysuria or hematuria.    She has been drinking copious amounts of water, at least 5 large bottles per day to protect her kidneys.  She also reports taking tolvaptan which is vasopressin receptor antagonist prescribed for polycystic kidney disease.  This resulted in diuresis when she takes it.  As a result, she has significant urinary frequency and urgency upon taking this medication.  If she does not take it, she does well.  She does not have incontinence.  She is limited to modify her behaviors.   PMH: Past Medical History:  Diagnosis Date   COVID-19 virus infection 05/2020   Hematuria    History of chicken pox    Hypertension    Nephrolithiasis    Polycystic kidney disease    Right sided sciatica 11/13/2019    Surgical History: Past Surgical History:  Procedure Laterality Date   CESAREAN SECTION     LITHOTRIPSY     has had 3 lithotripsy   TUBAL LIGATION      Home Medications:  Allergies as of 11/25/2020   No Known Allergies      Medication List        Accurate as of November 25, 2020  9:56 AM. If you have any questions, ask your nurse or doctor.           STOP taking these medications    amoxicillin-clavulanate 875-125 MG tablet Commonly known as: AUGMENTIN Stopped by: Hollice Espy, MD   azelastine 0.1 % nasal spray Commonly known as: ASTELIN Stopped by: Hollice Espy, MD   benzonatate 100 MG capsule Commonly known as: TESSALON Stopped by: Hollice Espy, MD       TAKE these medications    amLODipine 10 MG tablet Commonly known as: NORVASC What changed: Another medication with the same name was removed. Continue taking this medication, and follow the directions you see here. Changed by: Hollice Espy, MD   cyclobenzaprine 10 MG tablet Commonly known as: FLEXERIL TAKE 1 TABLET(10 MG) BY MOUTH TWICE DAILY   HYDROcodone-acetaminophen 10-325 MG tablet Commonly known as: NORCO Take 1 tablet by mouth every 6 (six) hours as needed for moderate pain or severe pain. Start taking on: December 03, 2020 What changed: Another medication with the same name was removed. Continue taking this medication, and follow the directions you see here. Changed by: Hollice Espy, MD   lisinopril 10 MG tablet Commonly known as: ZESTRIL TAKE 1 TABLET(10 MG) BY MOUTH DAILY   morphine 60 MG 12 hr tablet Commonly known as: MS Contin Take 1 tablet (60 mg total) by mouth 3 (three) times daily. Start taking on: December 03, 2020 What changed: Another medication with the same  name was removed. Continue taking this medication, and follow the directions you see here. Changed by: Hollice Espy, MD   naloxone 4 MG/0.1ML Liqd nasal spray kit Commonly known as: NARCAN For excess sedation from opioids.   Tolvaptan 90 & 30 MG Tbpk Take 1 tablet by mouth 2 (two) times daily. Takes 60 mg in the AM and 30 mg 8 hours later.        Allergies: No Known Allergies  Family History: Family History  Problem Relation Age of Onset   Arthritis Mother    Hypertension Mother    Arthritis Father    Cancer Father    Early death Father    Heart disease  Father    Hypertension Father    Kidney disease Father        POLYCYSTIC KIDNEY DISEASE   Heart attack Father    Hypertension Brother     Social History:  reports that she has been smoking cigarettes. She has been smoking an average of .5 packs per day. She has never used smokeless tobacco. She reports that she does not drink alcohol and does not use drugs.   Physical Exam: BP 128/88   Pulse (!) 108   Ht 5' 4"  (1.626 m)   Wt 170 lb (77.1 kg)   BMI 29.18 kg/m   Constitutional:  Alert and oriented, No acute distress. HEENT: [place] AT, moist mucus membranes.  Trachea midline, no masses. Cardiovascular: No clubbing, cyanosis, or edema. Respiratory: Normal respiratory effort, no increased work of breathing. Skin: No rashes, bruises or suspicious lesions. Neurologic: Grossly intact, no focal deficits, moving all 4 extremities. Psychiatric: Normal mood and affect.  Laboratory Data: Cr 1.17 6/22  Pertinent Imaging: DG Abd 1 View Personally reviewed images, as above   Assessment & Plan:    1. Kidney stones Punctate bilateral stones, asymptomatic  No interval growth of the stones in the past 2 years and remains asymptomatic  Continue copious hydration, reviewed stone prevention techniques along with signs and symptoms of acute stone episode.  At this point, she will follow-up as needed.  2. Polycystic kidney disease Renal function is stable, followed by Dr. Holley Raring  3. Urinary frequency We discussed the source of her urinary frequency/urgency primarily related to fluid intake as well as medications causing diuresis  She is been able to manage her symptoms behaviorally   If her symptoms worsen or she develops incontinence, we discussed the addition of an OAB medication which could be useful, hold off for the time being and follow-up as needed   Hollice Espy, MD  [facility] [address], [place], [place] [address] [phone]

## 2020-12-14 IMAGING — CR ABDOMEN - 1 VIEW
1 series · 2 of 2 positions shown · non-contrast
Comparison: MRI of the abdomen 12/31/2017. One-view abdomen
radiograph 01/19/2013.

CLINICAL DATA: Polycystic kidney disease.  Kidney stones.

EXAM:
ABDOMEN - 1 VIEW

[Series 1: t abdomen supine · 0.14mm/px · 2 of 2 slices shown]
[im 1/2]
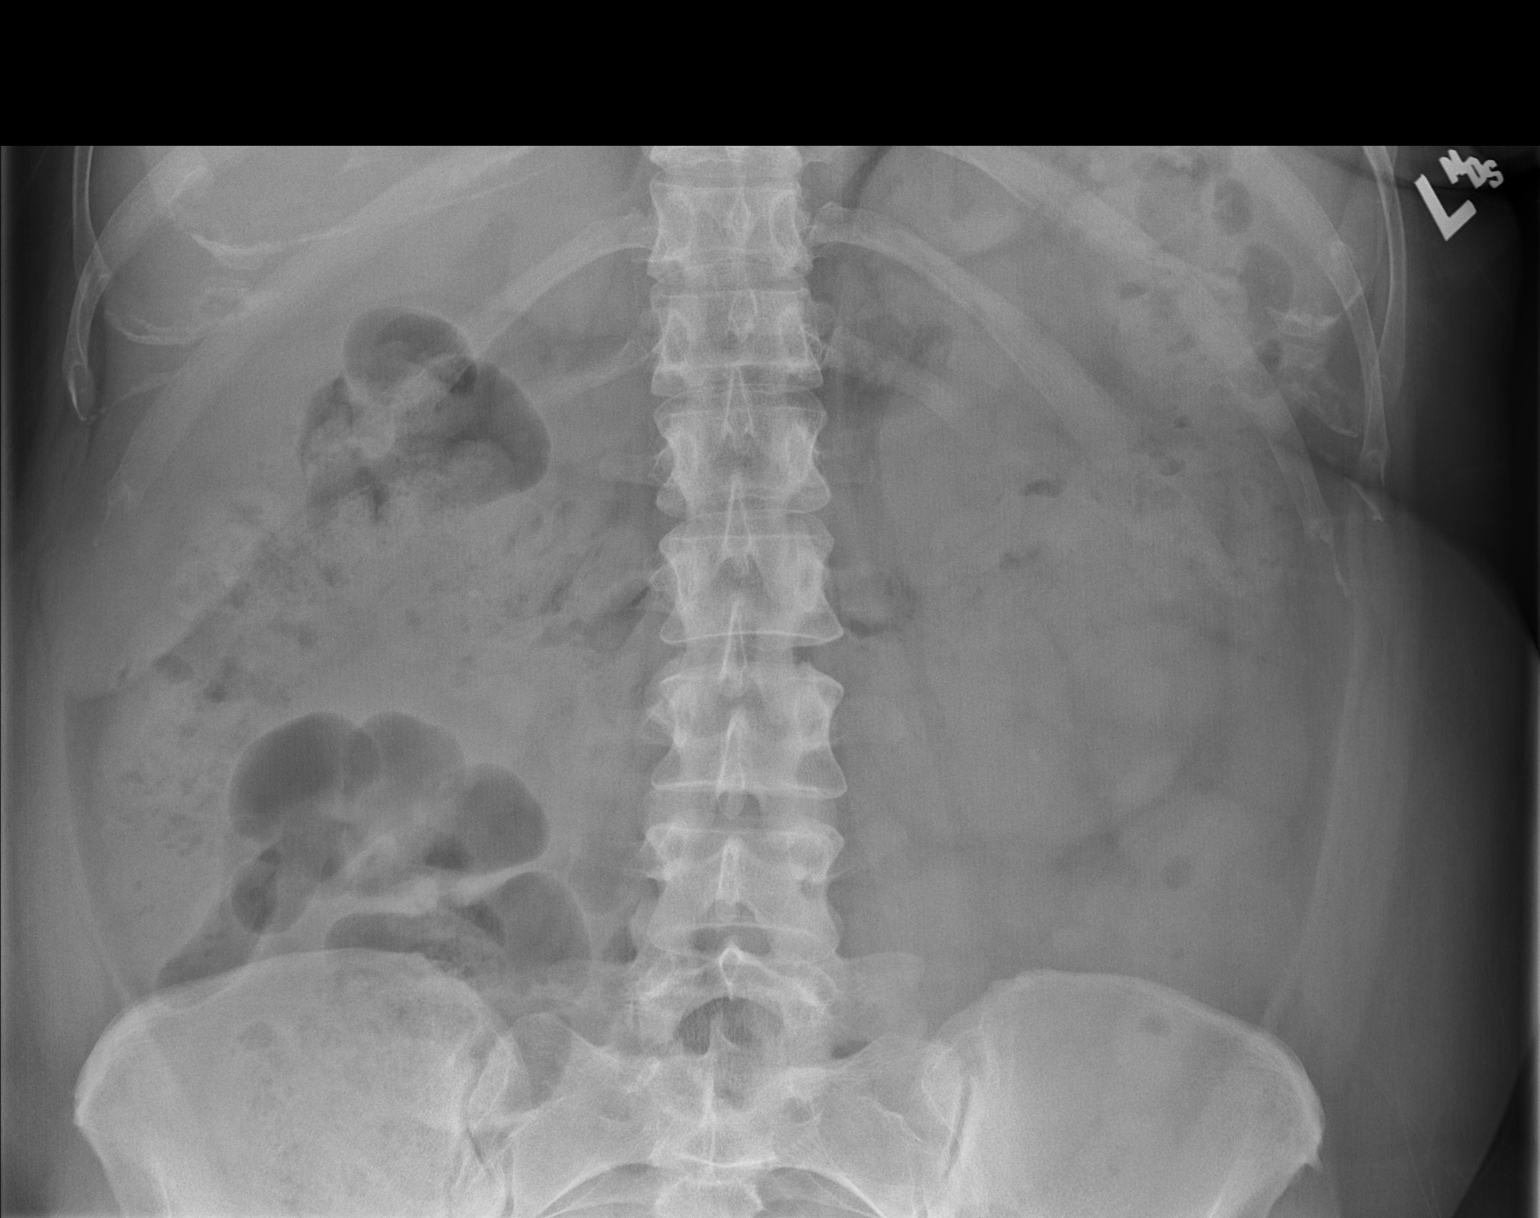
[im 2/2]
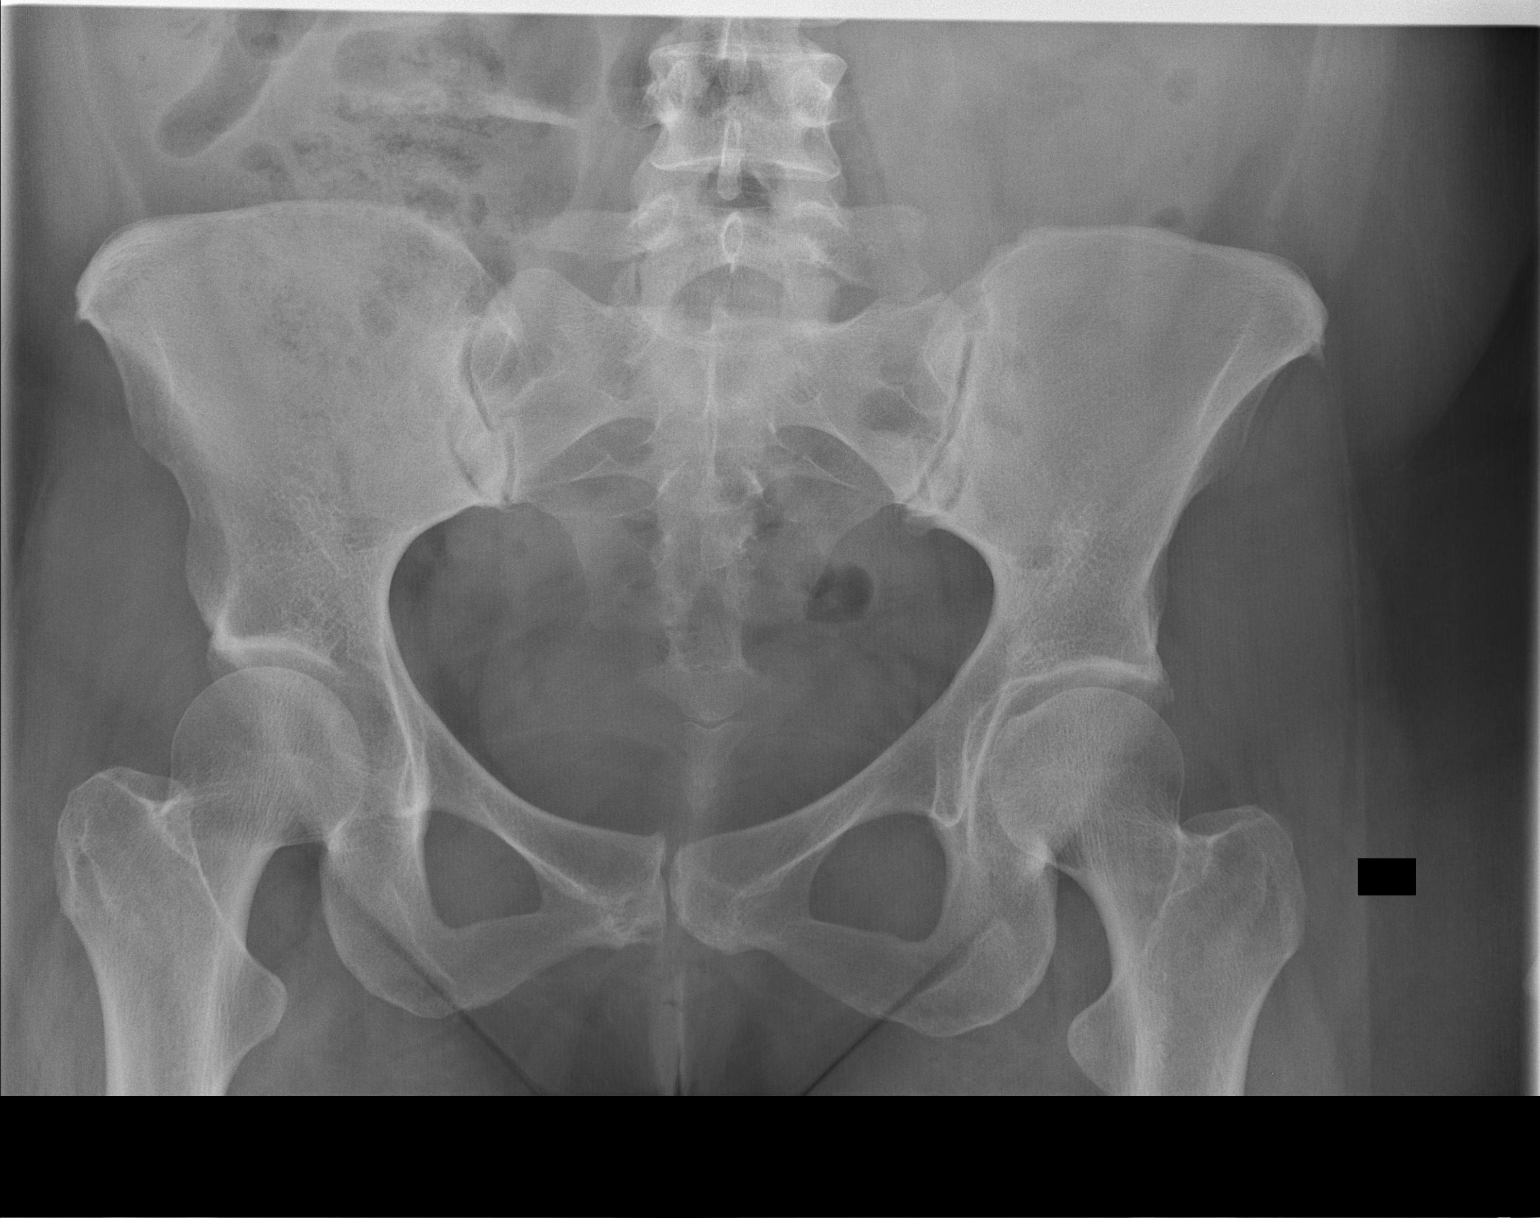

[2 of 2 positions shown; findings below may reference images not displayed]

FINDINGS: A small stem projects over the lower pole of the left kidney. There
may be a small stone in the central portion of the right kidney. The
right kidney is moderately obscured by stool. No other focal stones
are evident. Bowel gas pattern is normal. Axial skeleton is within
normal limits.
IMPRESSION: 1. Small stone at the lower pole of the left kidney is not
previously seen.
2. Possible stone at the midportion of the right kidney.

## 2020-12-24 ENCOUNTER — Other Ambulatory Visit: Payer: Self-pay | Admitting: Family Medicine

## 2021-01-05 ENCOUNTER — Ambulatory Visit: Payer: Medicare Other | Attending: Anesthesiology | Admitting: Anesthesiology

## 2021-01-05 ENCOUNTER — Encounter: Payer: Self-pay | Admitting: Anesthesiology

## 2021-01-05 ENCOUNTER — Other Ambulatory Visit: Payer: Self-pay

## 2021-01-05 DIAGNOSIS — M5136 Other intervertebral disc degeneration, lumbar region: Secondary | ICD-10-CM | POA: Diagnosis not present

## 2021-01-05 DIAGNOSIS — F1190 Opioid use, unspecified, uncomplicated: Secondary | ICD-10-CM | POA: Diagnosis not present

## 2021-01-05 DIAGNOSIS — M5442 Lumbago with sciatica, left side: Secondary | ICD-10-CM | POA: Diagnosis not present

## 2021-01-05 DIAGNOSIS — Q613 Polycystic kidney, unspecified: Secondary | ICD-10-CM

## 2021-01-05 DIAGNOSIS — M47816 Spondylosis without myelopathy or radiculopathy, lumbar region: Secondary | ICD-10-CM

## 2021-01-05 DIAGNOSIS — G8929 Other chronic pain: Secondary | ICD-10-CM

## 2021-01-05 DIAGNOSIS — G894 Chronic pain syndrome: Secondary | ICD-10-CM

## 2021-01-05 DIAGNOSIS — M5431 Sciatica, right side: Secondary | ICD-10-CM

## 2021-01-05 MED ORDER — HYDROCODONE-ACETAMINOPHEN 10-325 MG PO TABS: 1.0000 | ORAL_TABLET | Freq: Two times a day (BID) | ORAL | 0 refills | Status: DC | PRN

## 2021-01-05 MED ORDER — MORPHINE SULFATE ER 60 MG PO TBCR: 60.0000 mg | EXTENDED_RELEASE_TABLET | Freq: Three times a day (TID) | ORAL | 0 refills | Status: DC

## 2021-01-05 MED ORDER — HYDROCODONE-ACETAMINOPHEN 10-325 MG PO TABS: 1.0000 | ORAL_TABLET | Freq: Two times a day (BID) | ORAL | 0 refills | Status: DC

## 2021-01-05 NOTE — Progress Notes (Signed)
Virtual Visit via Telephone Note  I connected with Megan Brock on 01/05/21 at 10:15 AM EDT by telephone and verified that I am speaking with the correct person using two identifiers.  Location: Patient: Home Provider: Pain control center   I discussed the limitations, risks, security and privacy concerns of performing an evaluation and management service by telephone and the availability of in person appointments. I also discussed with the patient that there may be a patient responsible charge related to this service. The patient expressed understanding and agreed to proceed.   History of Present Illness: I was able to catch up with Megan Brock via telephone.  We could not link for the video portion of the virtual conference.  She reports that her back pain has been persistent and she is getting some intermittent right lower leg sciatica but this has intermittent in nature.  She is doing her stretching exercises and these seem to help somewhat.  She is also taking Flexeril to help with the spasming.  Her last epidurals were back in March and she got excellent relief from those.  She is trying to stay active and otherwise is doing reasonably well.  The quality characteristic and distribution of her chronic low back pain is stable in nature.  She takes her medications as prescribed.  She takes her long-acting opioid 3 times a day and these continue to work well for her.  She uses her short acting hydrocodone for breakthrough pain and overall reports that she is doing very well sleeping better and keeps good pain control generally with the low back.  The sciatica has been problematic and that it is less responsive to the opioids an can be severe at times.  No changes in lower extremity strength or function are noted.  She continues to follow along with her nephrologist regarding her polycystic kidney disease.Review of systems: General: No fevers or chills Pulmonary: No shortness of breath or  dyspnea Cardiac: No angina or palpitations or lightheadedness GI: No abdominal pain or constipation Psych: No depression      Observations/Objective:  Current Outpatient Medications:    [START ON 01/28/2021] HYDROcodone-acetaminophen (NORCO) 10-325 MG tablet, Take 1 tablet by mouth 2 (two) times daily., Disp: 45 tablet, Rfl: 0   [START ON 03/02/2021] HYDROcodone-acetaminophen (NORCO) 10-325 MG tablet, Take 1 tablet by mouth 2 (two) times daily as needed for moderate pain or severe pain., Disp: 45 tablet, Rfl: 0   [START ON 01/28/2021] morphine (MS CONTIN) 60 MG 12 hr tablet, Take 1 tablet (60 mg total) by mouth 3 (three) times daily., Disp: 75 tablet, Rfl: 0   [START ON 03/02/2021] morphine (MS CONTIN) 60 MG 12 hr tablet, Take 1 tablet (60 mg total) by mouth 3 (three) times daily., Disp: 75 tablet, Rfl: 0   amLODipine (NORVASC) 10 MG tablet, , Disp: , Rfl:    cyclobenzaprine (FLEXERIL) 10 MG tablet, TAKE 1 TABLET(10 MG) BY MOUTH TWICE DAILY, Disp: 60 tablet, Rfl: 3   lisinopril (ZESTRIL) 10 MG tablet, TAKE 1 TABLET(10 MG) BY MOUTH DAILY, Disp: 30 tablet, Rfl: 0   naloxone (NARCAN) nasal spray 4 mg/0.1 mL, For excess sedation from opioids., Disp: 1 kit, Rfl: 2   Tolvaptan 90 & 30 MG TBPK, Take 1 tablet by mouth 2 (two) times daily. Takes 60 mg in the AM and 30 mg 8 hours later., Disp: , Rfl:    Assessment and Plan: 1. Lumbar spondylosis   2. Chronic bilateral low back pain with left-sided sciatica  3. Chronic, continuous use of opioids   4. DDD (degenerative disc disease), lumbar   5. Polycystic kidney disease   6. Chronic pain syndrome   7. Right sided sciatica   Based discussion today I think it is appropriate to continue her current medications.  [place] about the time of her next refill so we will move forward to October 12 to assist.  Her next refill will be due November 14.  This will be for both the hydrocodone and MS Contin.  No other changes will be made  in her pain management regimen.  I encouraged her to continue stretching exercises and use her Flexeril.  Should this pain in this right lower leg with the sciatica persist we will plan on a repeat epidural.  Continue follow-up with her primary care physician for baseline medical care with schedule return to clinic in 2 months  Follow Up Instructions:    I discussed the assessment and treatment plan with the patient. The patient was provided an opportunity to ask questions and all were answered. The patient agreed with the plan and demonstrated an understanding of the instructions.   The patient was advised to call back or seek an in-person evaluation if the symptoms worsen or if the condition fails to improve as anticipated.  I provided 30 minutes of non-face-to-face time during this encounter.   Molli Barrows, MD

## 2021-01-22 DIAGNOSIS — N200 Calculus of kidney: Secondary | ICD-10-CM | POA: Diagnosis not present

## 2021-01-22 DIAGNOSIS — I10 Essential (primary) hypertension: Secondary | ICD-10-CM | POA: Diagnosis not present

## 2021-01-22 DIAGNOSIS — Q612 Polycystic kidney, adult type: Secondary | ICD-10-CM | POA: Diagnosis not present

## 2021-01-22 DIAGNOSIS — R809 Proteinuria, unspecified: Secondary | ICD-10-CM | POA: Diagnosis not present

## 2021-01-26 ENCOUNTER — Other Ambulatory Visit: Payer: Self-pay | Admitting: Nephrology

## 2021-01-26 DIAGNOSIS — Q612 Polycystic kidney, adult type: Secondary | ICD-10-CM

## 2021-01-26 DIAGNOSIS — Z8249 Family history of ischemic heart disease and other diseases of the circulatory system: Secondary | ICD-10-CM

## 2021-01-26 DIAGNOSIS — I10 Essential (primary) hypertension: Secondary | ICD-10-CM

## 2021-01-27 ENCOUNTER — Other Ambulatory Visit: Payer: Self-pay | Admitting: Family Medicine

## 2021-01-27 NOTE — Telephone Encounter (Signed)
Requested medications are due for refill today yes  Requested medications are on the active medication list yes  Last refill 12/24/20  Last visit 06/26/19  Future visit scheduled no  Notes to clinic failed protocol due to lab work is from 2018, no upcoming visit scheduled.

## 2021-02-03 ENCOUNTER — Other Ambulatory Visit: Payer: Self-pay | Admitting: Nephrology

## 2021-02-03 DIAGNOSIS — I10 Essential (primary) hypertension: Secondary | ICD-10-CM

## 2021-02-03 DIAGNOSIS — I671 Cerebral aneurysm, nonruptured: Secondary | ICD-10-CM

## 2021-02-19 ENCOUNTER — Encounter: Payer: Self-pay | Admitting: Anesthesiology

## 2021-02-19 ENCOUNTER — Ambulatory Visit: Payer: Medicare Other | Attending: Anesthesiology | Admitting: Anesthesiology

## 2021-02-19 ENCOUNTER — Other Ambulatory Visit: Payer: Self-pay

## 2021-02-19 DIAGNOSIS — M47816 Spondylosis without myelopathy or radiculopathy, lumbar region: Secondary | ICD-10-CM | POA: Diagnosis not present

## 2021-02-19 DIAGNOSIS — M5136 Other intervertebral disc degeneration, lumbar region: Secondary | ICD-10-CM | POA: Diagnosis not present

## 2021-02-19 DIAGNOSIS — M5431 Sciatica, right side: Secondary | ICD-10-CM

## 2021-02-19 DIAGNOSIS — M5442 Lumbago with sciatica, left side: Secondary | ICD-10-CM | POA: Diagnosis not present

## 2021-02-19 DIAGNOSIS — F1190 Opioid use, unspecified, uncomplicated: Secondary | ICD-10-CM | POA: Diagnosis not present

## 2021-02-19 DIAGNOSIS — G8929 Other chronic pain: Secondary | ICD-10-CM

## 2021-02-19 DIAGNOSIS — G894 Chronic pain syndrome: Secondary | ICD-10-CM

## 2021-02-19 DIAGNOSIS — Q613 Polycystic kidney, unspecified: Secondary | ICD-10-CM

## 2021-02-19 DIAGNOSIS — M51369 Other intervertebral disc degeneration, lumbar region without mention of lumbar back pain or lower extremity pain: Secondary | ICD-10-CM

## 2021-02-19 MED ORDER — HYDROCODONE-ACETAMINOPHEN 10-325 MG PO TABS: 1.0000 | ORAL_TABLET | Freq: Two times a day (BID) | ORAL | 0 refills | Status: AC

## 2021-02-19 MED ORDER — HYDROCODONE-ACETAMINOPHEN 10-325 MG PO TABS: 1.0000 | ORAL_TABLET | Freq: Two times a day (BID) | ORAL | 0 refills | Status: DC | PRN

## 2021-02-19 MED ORDER — MORPHINE SULFATE ER 60 MG PO TBCR: 60.0000 mg | EXTENDED_RELEASE_TABLET | Freq: Three times a day (TID) | ORAL | 0 refills | Status: DC

## 2021-02-19 NOTE — Progress Notes (Signed)
Virtual Visit via Telephone Note  I connected with [name] on 02/19/21 at  1:00 PM EDT by telephone and verified that I am speaking with the correct person using two identifiers.  Location: Patient: Home Provider: Pain control center   I discussed the limitations, risks, security and privacy concerns of performing an evaluation and management service by telephone and the availability of in person appointments. I also discussed with the patient that there may be a patient responsible charge related to this service. The patient expressed understanding and agreed to proceed.   History of Present Illness: I spoke with [name] regarding her low back pain and sciatica symptoms.  We are able to do this via telephone but unable to link for the video portion of the conference and she states that she has been doing well.  She did have some lower extremity swelling recently and has changed her antihypertensive medication and started on HydroDIURIL for some edema.  Overall the pain in the low back and sciatica symptoms been stable.  She has responded favorably in the past epidural steroids as well.  She is also taking her chronic opioid medication regimen and this continues to work well for her and keep her pain from her polycystic kidney disease low back under good control.  She is doing her exercises and continue efforts at weight loss and having success with this.  Otherwise she is in her usual state of health and no new changes mentioned today.  No side effects with her medications are reported.  Review of systems: General: No fevers or chills Pulmonary: No shortness of breath or dyspnea Cardiac: No angina or palpitations or lightheadedness GI: No abdominal pain or constipation Psych: No depression    Observations/Objective:   Current Outpatient Medications:    amLODipine (NORVASC) 10 MG tablet, , Disp: , Rfl:    cyclobenzaprine (FLEXERIL) 10 MG tablet, TAKE 1 TABLET(10 MG) BY MOUTH  TWICE DAILY, Disp: 60 tablet, Rfl: 3   [START ON 02/27/2021] HYDROcodone-acetaminophen (NORCO) 10-325 MG tablet, Take 1 tablet by mouth 2 (two) times daily., Disp: 45 tablet, Rfl: 0   [START ON 03/29/2021] HYDROcodone-acetaminophen (NORCO) 10-325 MG tablet, Take 1 tablet by mouth 2 (two) times daily as needed for moderate pain or severe pain., Disp: 45 tablet, Rfl: 0   lisinopril (ZESTRIL) 10 MG tablet, TAKE 1 TABLET(10 MG) BY MOUTH DAILY, Disp: 30 tablet, Rfl: 0   [START ON 02/27/2021] morphine (MS CONTIN) 60 MG 12 hr tablet, Take 1 tablet (60 mg total) by mouth 3 (three) times daily., Disp: 75 tablet, Rfl: 0   [START ON 03/29/2021] morphine (MS CONTIN) 60 MG 12 hr tablet, Take 1 tablet (60 mg total) by mouth 3 (three) times daily., Disp: 75 tablet, Rfl: 0   naloxone (NARCAN) nasal spray 4 mg/0.1 mL, For excess sedation from opioids., Disp: 1 kit, Rfl: 2   Tolvaptan 90 & 30 MG TBPK, Take 1 tablet by mouth 2 (two) times daily. Takes 60 mg in the AM and 30 mg 8 hours later., Disp: , Rfl:   Assessment and Plan:  1. Lumbar spondylosis   2. Chronic bilateral low back pain with left-sided sciatica   3. Chronic, continuous use of opioids   4. DDD (degenerative disc disease), lumbar   5. Polycystic kidney disease   6. Chronic pain syndrome   7. Right sided sciatica   Based on our discussion today and after review of the [facility] practitioner database information it is appropriate to  refill her medicines for November 11 and December 11.  No changes in her pharmacologic regimen will be initiated.  I want her to continue efforts at stretching strengthening.  She may be a candidate for repeat epidural approximately 2 to 3 months.  She receives these about 2-3 times a year and gets excellent relief and response.  Continue follow-up with her primary care physicians and urologist additionally with return to clinic scheduled in 2 months. Follow Up Instructions:    I discussed the assessment and treatment  plan with the patient. The patient was provided an opportunity to ask questions and all were answered. The patient agreed with the plan and demonstrated an understanding of the instructions.   The patient was advised to call back or seek an in-person evaluation if the symptoms worsen or if the condition fails to improve as anticipated.  I provided 30 minutes of non-face-to-face time during this encounter.   Molli Barrows, MD

## 2021-02-25 ENCOUNTER — Other Ambulatory Visit: Payer: Self-pay | Admitting: Family Medicine

## 2021-02-25 NOTE — Telephone Encounter (Signed)
Requested medications are due for refill today.  yes  Requested medications are on the active medications list.  yes  Last refill. 01/27/2021  Future visit scheduled.   no  Notes to clinic.  Courtesy refill already given. Labs are expired. Pt last seen 3/9/221.

## 2021-03-28 ENCOUNTER — Other Ambulatory Visit: Payer: Self-pay | Admitting: Family Medicine

## 2021-03-28 NOTE — Telephone Encounter (Signed)
Requested medication (s) are due for refill today: yes  Requested medication (s) are on the active medication list: yes  Last refill:  02/26/21  Future visit scheduled: no  Notes to clinic:  Has already had a curtesy refill and there is no upcoming appointment scheduled, labs are 38 years old, please assess..   Requested Prescriptions  Pending Prescriptions Disp Refills   lisinopril (ZESTRIL) 10 MG tablet [Pharmacy Med Name: LISINOPRIL 10MG  TABLETS] 90 tablet     Sig: TAKE 1 TABLET(10 MG) BY MOUTH DAILY     Cardiovascular:  ACE Inhibitors Failed - 03/28/2021  6:20 AM      Failed - Cr in normal range and within 180 days    Creat  Date Value Ref Range Status  02/08/2017 0.98 0.50 - 1.10 mg/dL Final          Failed - K in normal range and within 180 days    Potassium  Date Value Ref Range Status  02/08/2017 4.0 3.5 - 5.3 mmol/L Final          Failed - Valid encounter within last 6 months    Recent Outpatient Visits           1 year ago Medicare annual wellness visit, subsequent   [facility], [place]   3 years ago Hypertension secondary to other renal disorders   [facility], MD   3 years ago Acute otitis media, unspecified otitis media type   [facility], MD   3 years ago Hypertension secondary to other renal disorders   [facility], MD   4 years ago Hypertension secondary to other renal disorders   [facility], MD              Passed - Patient is not pregnant      Passed - Last BP in normal range    BP Readings from Last 1 Encounters:  11/25/20 128/88

## 2021-04-08 ENCOUNTER — Other Ambulatory Visit: Payer: Self-pay | Admitting: *Deleted

## 2021-04-08 ENCOUNTER — Telehealth: Payer: Self-pay | Admitting: Anesthesiology

## 2021-04-08 MED ORDER — CYCLOBENZAPRINE HCL 10 MG PO TABS: ORAL_TABLET | ORAL | 3 refills | Status: DC

## 2021-04-08 NOTE — Telephone Encounter (Signed)
Called to let patient know that I have sent in Flexeril.

## 2021-04-08 NOTE — Telephone Encounter (Signed)
Patient didn't get refill on flexiril, Dr. Pernell Dupre didn't fill at last visit. Please let patient know.

## 2021-04-08 NOTE — Telephone Encounter (Signed)
Message sent to Dr Adams.  

## 2021-04-16 ENCOUNTER — Telehealth: Payer: Medicare Other | Admitting: Physician Assistant

## 2021-04-16 DIAGNOSIS — B9689 Other specified bacterial agents as the cause of diseases classified elsewhere: Secondary | ICD-10-CM

## 2021-04-16 DIAGNOSIS — J019 Acute sinusitis, unspecified: Secondary | ICD-10-CM | POA: Diagnosis not present

## 2021-04-16 MED ORDER — AMOXICILLIN-POT CLAVULANATE 875-125 MG PO TABS: 1.0000 | ORAL_TABLET | Freq: Two times a day (BID) | ORAL | 0 refills | Status: DC

## 2021-04-16 NOTE — Progress Notes (Signed)

## 2021-04-16 NOTE — Progress Notes (Signed)
I have spent 5 minutes in review of e-visit questionnaire, review and updating patient chart, medical decision making and response to patient.   Makaya Juneau Cody Zyeir Dymek, PA-C    

## 2021-04-22 ENCOUNTER — Other Ambulatory Visit: Payer: Self-pay

## 2021-04-22 ENCOUNTER — Encounter: Payer: Self-pay | Admitting: Anesthesiology

## 2021-04-22 ENCOUNTER — Ambulatory Visit: Payer: Medicare Other | Attending: Anesthesiology | Admitting: Anesthesiology

## 2021-04-22 DIAGNOSIS — M5442 Lumbago with sciatica, left side: Secondary | ICD-10-CM | POA: Diagnosis not present

## 2021-04-22 DIAGNOSIS — Q613 Polycystic kidney, unspecified: Secondary | ICD-10-CM

## 2021-04-22 DIAGNOSIS — F1190 Opioid use, unspecified, uncomplicated: Secondary | ICD-10-CM | POA: Diagnosis not present

## 2021-04-22 DIAGNOSIS — G894 Chronic pain syndrome: Secondary | ICD-10-CM

## 2021-04-22 DIAGNOSIS — M51369 Other intervertebral disc degeneration, lumbar region without mention of lumbar back pain or lower extremity pain: Secondary | ICD-10-CM

## 2021-04-22 DIAGNOSIS — M5136 Other intervertebral disc degeneration, lumbar region: Secondary | ICD-10-CM

## 2021-04-22 DIAGNOSIS — G8929 Other chronic pain: Secondary | ICD-10-CM

## 2021-04-22 DIAGNOSIS — M47816 Spondylosis without myelopathy or radiculopathy, lumbar region: Secondary | ICD-10-CM

## 2021-04-22 MED ORDER — HYDROCODONE-ACETAMINOPHEN 10-325 MG PO TABS: 1.0000 | ORAL_TABLET | Freq: Two times a day (BID) | ORAL | 0 refills | Status: DC

## 2021-04-22 MED ORDER — MORPHINE SULFATE ER 60 MG PO TBCR: 60.0000 mg | EXTENDED_RELEASE_TABLET | Freq: Three times a day (TID) | ORAL | 0 refills | Status: DC

## 2021-04-22 MED ORDER — HYDROCODONE-ACETAMINOPHEN 10-325 MG PO TABS: 1.0000 | ORAL_TABLET | Freq: Two times a day (BID) | ORAL | 0 refills | Status: AC | PRN

## 2021-04-22 NOTE — Progress Notes (Signed)
Virtual Visit via Telephone Note  I connected with Megan Brock on 04/22/21 at  1:40 PM EST by telephone and verified that I am speaking with the correct person usi  ng two identifiers.  Location: Patient: Home Provider: Pain control center   I discussed the limitations, risks, security and privacy concerns of performing an evaluation and management service by telephone and the availability of in person appointments. I also discussed with the patient that there may be a patient responsible charge related to this service. The patient expressed understanding and agreed to proceed.   History of Present Illness:   I spoke with Megan Brock via telephone as we were unable to link for the video portion of the virtual conference but she reports that she is doing reasonably well with her low back pain and is having some intermittent sciatica symptoms that occur with increased activity but generally those are well controlled.  No change in lower extremity strength or function is noted.  She takes her medications for her flank pain polycystic kidney disease and these continue to work well for her.  She has been on chronic opioid therapy secondary to this and this combination therapy continues to work well for her.  She takes her hydrocodone approximately 1 or 2 times a day and her MS Contin 2-3 times a day spaced out accordingly and this has worked well for her for multiple years.  No side effects are reported and she continues to derive good functional quality lifestyle improvement with the medications where she has failed conservative therapy in the past.  No change in the quality characteristic or distribution of her low back pain or leg pain are noted.  Review of systems: General: No fevers or chills Pulmonary: No shortness of breath or dyspnea Cardiac: No angina or palpitations or lightheadedness GI: No abdominal pain or constipation Psych: No depression    Observations/Objective:   Current  Outpatient Medications:    [START ON 05/28/2021] HYDROcodone-acetaminophen (NORCO) 10-325 MG tablet, Take 1 tablet by mouth 2 (two) times daily., Disp: 45 tablet, Rfl: 0   [START ON 05/28/2021] morphine (MS CONTIN) 60 MG 12 hr tablet, Take 1 tablet (60 mg total) by mouth 3 (three) times daily., Disp: 75 tablet, Rfl: 0   amLODipine (NORVASC) 10 MG tablet, , Disp: , Rfl:    amoxicillin-clavulanate (AUGMENTIN) 875-125 MG tablet, Take 1 tablet by mouth 2 (two) times daily., Disp: 14 tablet, Rfl: 0   cyclobenzaprine (FLEXERIL) 10 MG tablet, TAKE 1 TABLET(10 MG) BY MOUTH TWICE DAILY, Disp: 60 tablet, Rfl: 3   [START ON 04/28/2021] HYDROcodone-acetaminophen (NORCO) 10-325 MG tablet, Take 1 tablet by mouth 2 (two) times daily as needed for moderate pain or severe pain., Disp: 45 tablet, Rfl: 0   lisinopril (ZESTRIL) 10 MG tablet, TAKE 1 TABLET(10 MG) BY MOUTH DAILY, Disp: 90 tablet, Rfl: 0   [START ON 04/28/2021] morphine (MS CONTIN) 60 MG 12 hr tablet, Take 1 tablet (60 mg total) by mouth 3 (three) times daily., Disp: 75 tablet, Rfl: 0   naloxone (NARCAN) nasal spray 4 mg/0.1 mL, For excess sedation from opioids., Disp: 1 kit, Rfl: 2   Tolvaptan 90 & 30 MG TBPK, Take 1 tablet by mouth 2 (two) times daily. Takes 60 mg in the AM and 30 mg 8 hours later., Disp: , Rfl:    Past Medical History:  Diagnosis Date   COVID-19 virus infection 05/2020   Hematuria    History of chicken pox    Hypertension  Nephrolithiasis    Polycystic kidney disease    Right sided sciatica 11/13/2019    Observations/Objective:  1. Lumbar spondylosis   2. Chronic bilateral low back pain with left-sided sciatica   3. Chronic, continuous use of opioids   4. DDD (degenerative disc disease), lumbar   5. Polycystic kidney disease   6. Chronic pain syndrome     Assessment and Plan: Based on our discussion and after review of the [facility] practitioner database information when to refill her hydrocodone and MS Contin for the  next 2 months dated for January 10 and February 9.  No other changes will be made in her pharmacologic regimen at this time.  I encouraged her to continue with stretching strengthening exercises as reviewed today and we will defer on any further epidural steroids at this time as her sciatica has remained under control.  Want her to continue follow-up with her nephrologist and primary care physicians for baseline medical care with return to clinic scheduled in 2 months.   Follow Up Instructions:    I discussed the assessment and treatment plan with the patient. The patient was provided an opportunity to ask questions and all were answered. The patient agreed with the plan and demonstrated an understanding of the instructions.   The patient was advised to call back or seek an in-person evaluation if the symptoms worsen or if the condition fails to improve as anticipated.  I provided 30 minutes of non-face-to-face time during this encounter.   Molli Barrows, MD

## 2021-04-28 ENCOUNTER — Telehealth: Payer: Self-pay | Admitting: Anesthesiology

## 2021-04-28 NOTE — Telephone Encounter (Signed)
Patient needs PA for pain meds.

## 2021-04-28 NOTE — Telephone Encounter (Signed)
Working on this Zeba, currently awaiting a fax from [place].  See notes under Medications for further info.

## 2021-04-29 NOTE — Telephone Encounter (Signed)
Sent Fax from [place] back this am.

## 2021-05-05 DIAGNOSIS — I10 Essential (primary) hypertension: Secondary | ICD-10-CM | POA: Diagnosis not present

## 2021-05-05 DIAGNOSIS — N200 Calculus of kidney: Secondary | ICD-10-CM | POA: Diagnosis not present

## 2021-05-05 DIAGNOSIS — Q612 Polycystic kidney, adult type: Secondary | ICD-10-CM | POA: Diagnosis not present

## 2021-05-05 DIAGNOSIS — R809 Proteinuria, unspecified: Secondary | ICD-10-CM | POA: Diagnosis not present

## 2021-05-11 DIAGNOSIS — N200 Calculus of kidney: Secondary | ICD-10-CM | POA: Diagnosis not present

## 2021-05-11 DIAGNOSIS — Q612 Polycystic kidney, adult type: Secondary | ICD-10-CM | POA: Diagnosis not present

## 2021-05-11 DIAGNOSIS — I10 Essential (primary) hypertension: Secondary | ICD-10-CM | POA: Diagnosis not present

## 2021-05-11 DIAGNOSIS — R809 Proteinuria, unspecified: Secondary | ICD-10-CM | POA: Diagnosis not present

## 2021-05-25 LAB — TOXASSURE SELECT 13 (MW), URINE

## 2021-05-27 ENCOUNTER — Other Ambulatory Visit: Payer: Self-pay | Admitting: Nephrology

## 2021-05-27 DIAGNOSIS — Z8249 Family history of ischemic heart disease and other diseases of the circulatory system: Secondary | ICD-10-CM

## 2021-05-27 DIAGNOSIS — R519 Headache, unspecified: Secondary | ICD-10-CM

## 2021-05-27 DIAGNOSIS — Q613 Polycystic kidney, unspecified: Secondary | ICD-10-CM

## 2021-05-27 DIAGNOSIS — G43909 Migraine, unspecified, not intractable, without status migrainosus: Secondary | ICD-10-CM

## 2021-06-10 ENCOUNTER — Ambulatory Visit
Admission: RE | Admit: 2021-06-10 | Discharge: 2021-06-10 | Disposition: A | Discharge status: Home / Self Care | Payer: Medicare Other | Source: Ambulatory Visit | Attending: Nephrology | Admitting: Nephrology

## 2021-06-10 DIAGNOSIS — G43909 Migraine, unspecified, not intractable, without status migrainosus: Secondary | ICD-10-CM | POA: Diagnosis not present

## 2021-06-10 DIAGNOSIS — Q613 Polycystic kidney, unspecified: Secondary | ICD-10-CM | POA: Diagnosis not present

## 2021-06-15 ENCOUNTER — Ambulatory Visit: Payer: Medicare Other | Attending: Anesthesiology | Admitting: Anesthesiology

## 2021-06-15 ENCOUNTER — Encounter: Payer: Self-pay | Admitting: Anesthesiology

## 2021-06-15 ENCOUNTER — Other Ambulatory Visit: Payer: Self-pay

## 2021-06-15 VITALS — BP 116/94 | HR 126 | Temp 98.0°F | Resp 16 | Ht 64.0 in | Wt 170.0 lb

## 2021-06-15 DIAGNOSIS — M5136 Other intervertebral disc degeneration, lumbar region: Secondary | ICD-10-CM | POA: Insufficient documentation

## 2021-06-15 DIAGNOSIS — M5442 Lumbago with sciatica, left side: Secondary | ICD-10-CM | POA: Diagnosis not present

## 2021-06-15 DIAGNOSIS — F1190 Opioid use, unspecified, uncomplicated: Secondary | ICD-10-CM

## 2021-06-15 DIAGNOSIS — Q613 Polycystic kidney, unspecified: Secondary | ICD-10-CM

## 2021-06-15 DIAGNOSIS — G894 Chronic pain syndrome: Secondary | ICD-10-CM

## 2021-06-15 DIAGNOSIS — M47816 Spondylosis without myelopathy or radiculopathy, lumbar region: Secondary | ICD-10-CM

## 2021-06-15 DIAGNOSIS — G8929 Other chronic pain: Secondary | ICD-10-CM | POA: Diagnosis present

## 2021-06-15 DIAGNOSIS — M5431 Sciatica, right side: Secondary | ICD-10-CM | POA: Diagnosis present

## 2021-06-15 DIAGNOSIS — M51369 Other intervertebral disc degeneration, lumbar region without mention of lumbar back pain or lower extremity pain: Secondary | ICD-10-CM

## 2021-06-15 MED ORDER — HYDROCODONE-ACETAMINOPHEN 10-325 MG PO TABS: 1.0000 | ORAL_TABLET | Freq: Four times a day (QID) | ORAL | 0 refills | Status: DC | PRN

## 2021-06-15 MED ORDER — MORPHINE SULFATE ER 60 MG PO TBCR: 60.0000 mg | EXTENDED_RELEASE_TABLET | Freq: Three times a day (TID) | ORAL | 0 refills | Status: DC

## 2021-06-15 MED ORDER — MORPHINE SULFATE ER 60 MG PO TBCR
60.0000 mg | EXTENDED_RELEASE_TABLET | Freq: Three times a day (TID) | ORAL | 0 refills | Status: DC
Start: 2021-06-27 — End: 2021-07-27

## 2021-06-15 MED ORDER — HYDROCODONE-ACETAMINOPHEN 10-325 MG PO TABS: 1.0000 | ORAL_TABLET | Freq: Two times a day (BID) | ORAL | 0 refills | Status: AC

## 2021-06-15 NOTE — Progress Notes (Signed)
Nursing Pain Medication Assessment:  Safety precautions to be maintained throughout the outpatient stay will include: orient to surroundings, keep bed in low position, maintain call bell within reach at all times, provide assistance with transfer out of bed and ambulation.  Medication Inspection Compliance: Pill count conducted under aseptic conditions, in front of the patient. Neither the pills nor the bottle was removed from the patient's sight at any time. Once count was completed pills were immediately returned to the patient in their original bottle.  Medication: Morphine IR Pill/Patch Count:  32 of 75 pills remain Pill/Patch Appearance: Markings consistent with prescribed medication Bottle Appearance: Standard pharmacy container. Clearly labeled. Filled Date:2 / 9 / 2023 Last Medication intake:  Today Hydrocodone  18/45 Filled 05/27/2021 Last took today

## 2021-06-15 NOTE — Progress Notes (Signed)
Subjective:  Patient ID: Megan Brock, female    DOB: 21-Mar-1981  Age: 41 y.o. MRN: [id number]  CC: Back Pain (low)   Procedure: None  HPI Megan Brock presents for reevaluation.  She was last seen 2 months ago and continues to do well following her recent epidural.  She still having some periodic right lower extremity sciatica worse with significant activity and no left lower extremity sciatica.  Her chronic low back pain has been stable.  She has a history of polycystic kidney disease and has been on chronic opioid therapy for this and her chronic low back pain.  She has done well with this therapy and gets good relief.  She rates her relief at about 75% and unfortunately she has failed more conservative therapy.  With her medications this generally lasts for about 6 to 8 hours before she gets recurrence of her same baseline pain.  Nothing else has been effective for her compared to the chronic opioid therapy.  The epidurals have helped with the sciatica symptoms but beyond that, nothing else gave her much relief she reports.  She derives good functional lifestyle improvement with the chronic opioid therapy and no side effects are reported.  Otherwise she is in her usual state of health trying to stay active and doing some walking and occasional stretching exercises.  The quality of her low back and leg pain is stable in nature.  Outpatient Medications Prior to Visit  Medication Sig Dispense Refill   amLODipine (NORVASC) 10 MG tablet      cyclobenzaprine (FLEXERIL) 10 MG tablet TAKE 1 TABLET(10 MG) BY MOUTH TWICE DAILY 60 tablet 3   lisinopril (ZESTRIL) 10 MG tablet TAKE 1 TABLET(10 MG) BY MOUTH DAILY 90 tablet 0   naloxone (NARCAN) nasal spray 4 mg/0.1 mL For excess sedation from opioids. 1 kit 2   Tolvaptan 90 & 30 MG TBPK Take 1 tablet by mouth 2 (two) times daily. Takes 60 mg in the AM and 30 mg 8 hours later.     HYDROcodone-acetaminophen (NORCO) 10-325 MG tablet Take 1 tablet by  mouth 2 (two) times daily. 45 tablet 0   morphine (MS CONTIN) 60 MG 12 hr tablet Take 1 tablet (60 mg total) by mouth 3 (three) times daily. 75 tablet 0   amoxicillin-clavulanate (AUGMENTIN) 875-125 MG tablet Take 1 tablet by mouth 2 (two) times daily. (Patient not taking: Reported on 06/15/2021) 14 tablet 0   No facility-administered medications prior to visit.    Review of Systems CNS: No confusion or sedation Cardiac: No angina or palpitations GI: No abdominal pain or constipation Constitutional: No nausea vomiting fevers or chills  Objective:  BP (!) 116/94 (BP Location: Right Arm, Patient Position: Sitting, Cuff Size: Normal)    Pulse (!) 126    Temp 98 F (36.7 C)    Resp 16    Ht 5' 4"  (1.626 m)    Wt 170 lb (77.1 kg)    SpO2 100%    BMI 29.18 kg/m    BP Readings from Last 3 Encounters:  06/15/21 (!) 116/94  11/25/20 128/88  07/16/20 134/76     Wt Readings from Last 3 Encounters:  06/15/21 170 lb (77.1 kg)  11/25/20 170 lb (77.1 kg)  07/16/20 170 lb (77.1 kg)     Physical Exam Pt is alert and oriented PERRL EOMI HEART IS RRR no murmur or rub LCTA no wheezing or rales MUSCULOSKELETAL reveals some paraspinous muscle tenderness in the lumbar region  consistent with her baseline examination no overt trigger points are noted she is ambulating well with good muscle tone and bulk to the lower extremities and no other changes on examination.  Labs  No results found for: HGBA1C Lab Results  Component Value Date   LDLCALC 105 (H) 07/17/2015   CREATININE 0.98 02/08/2017    -------------------------------------------------------------------------------------------------------------------- Lab Results  Component Value Date   WBC 9.2 02/08/2017   HGB 13.2 02/08/2017   HCT 37.7 02/08/2017   PLT 197 02/08/2017   GLUCOSE 87 02/08/2017   CHOL 174 07/17/2015   TRIG 139 07/17/2015   HDL 41 07/17/2015   LDLCALC 105 (H) 07/17/2015   ALT 10 02/08/2017   AST 12 02/08/2017    NA 140 02/08/2017   K 4.0 02/08/2017   CL 105 02/08/2017   CREATININE 0.98 02/08/2017   BUN 18 02/08/2017   CO2 29 02/08/2017   TSH 2.530 06/26/2019    --------------------------------------------------------------------------------------------------------------------- MR ANGIO HEAD WO CONTRAST  Result Date: 06/11/2021 CLINICAL DATA:  History of polycystic kidney disease. Rule out aneurysm. Migraine headache. EXAM: MRA HEAD WITHOUT CONTRAST TECHNIQUE: Angiographic images of the Circle of Willis were acquired using MRA technique without intravenous contrast. COMPARISON:  MRA head 04/09/2008 FINDINGS: Anterior circulation: Internal carotid artery widely patent bilaterally. Anterior and middle cerebral arteries widely patent without stenosis. Possible small aneurysm of the left posterior communicating artery region. This could be an infundibulum or small aneurysm. There is a very small left posterior communicating artery which appears patent. Posterior circulation: Both vertebral arteries patent to the basilar. PICA patent on the right. Left AICA is a large vessel and patent. Superior cerebellar and posterior cerebral arteries patent bilaterally. Fetal origin right posterior cerebral artery. Anatomic variants: None Other: None IMPRESSION: Possible 2.5 mm aneurysm versus infundibulum at the origin of the left posterior communicating artery. There appears to be a small patent left posterior communicating artery. CT angiogram may provide additional anatomical detail. No other aneurysm or stenosis. Electronically Signed   By: Franchot Gallo M.D.   On: 06/11/2021 11:32     Assessment & Plan:   Megan Brock was seen today for back pain.  Diagnoses and all orders for this visit:  Lumbar spondylosis  Chronic bilateral low back pain with left-sided sciatica  Chronic, continuous use of opioids  DDD (degenerative disc disease), lumbar  Right sided sciatica  Polycystic kidney disease  Chronic pain  syndrome  Other orders -     HYDROcodone-acetaminophen (NORCO) 10-325 MG tablet; Take 1 tablet by mouth 2 (two) times daily. -     HYDROcodone-acetaminophen (NORCO) 10-325 MG tablet; Take 1 tablet by mouth every 6 (six) hours as needed for moderate pain or severe pain. -     morphine (MS CONTIN) 60 MG 12 hr tablet; Take 1 tablet (60 mg total) by mouth 3 (three) times daily. -     morphine (MS CONTIN) 60 MG 12 hr tablet; Take 1 tablet (60 mg total) by mouth 3 (three) times daily.        ----------------------------------------------------------------------------------------------------------------------  Problem List Items Addressed This Visit       Unprioritized   Chronic pain syndrome (Chronic)   Relevant Medications   HYDROcodone-acetaminophen (NORCO) 10-325 MG tablet (Start on 06/27/2021)   HYDROcodone-acetaminophen (NORCO) 10-325 MG tablet (Start on 07/27/2021)   morphine (MS CONTIN) 60 MG 12 hr tablet (Start on 06/27/2021)   morphine (MS CONTIN) 60 MG 12 hr tablet (Start on 07/27/2021)   Lumbar spondylosis - Primary (Chronic)   Relevant  Medications   HYDROcodone-acetaminophen (NORCO) 10-325 MG tablet (Start on 06/27/2021)   HYDROcodone-acetaminophen (NORCO) 10-325 MG tablet (Start on 07/27/2021)   morphine (MS CONTIN) 60 MG 12 hr tablet (Start on 06/27/2021)   morphine (MS CONTIN) 60 MG 12 hr tablet (Start on 07/27/2021)   Chronic lower back pain   Relevant Medications   HYDROcodone-acetaminophen (NORCO) 10-325 MG tablet (Start on 06/27/2021)   HYDROcodone-acetaminophen (NORCO) 10-325 MG tablet (Start on 07/27/2021)   morphine (MS CONTIN) 60 MG 12 hr tablet (Start on 06/27/2021)   morphine (MS CONTIN) 60 MG 12 hr tablet (Start on 07/27/2021)   Chronic, continuous use of opioids   Right sided sciatica   Other Visit Diagnoses     DDD (degenerative disc disease), lumbar       Relevant Medications   HYDROcodone-acetaminophen (NORCO) 10-325 MG tablet (Start on 06/27/2021)    HYDROcodone-acetaminophen (NORCO) 10-325 MG tablet (Start on 07/27/2021)   morphine (MS CONTIN) 60 MG 12 hr tablet (Start on 06/27/2021)   morphine (MS CONTIN) 60 MG 12 hr tablet (Start on 07/27/2021)   Polycystic kidney disease             ----------------------------------------------------------------------------------------------------------------------  1. Lumbar spondylosis Continue core stretching strengthening exercises.  2. Chronic bilateral low back pain with left-sided sciatica We will defer on any repeat interventional therapy.  She has done well with previous epidural injections but I do not feel further interventional therapy is indicated at this time.  3. Chronic, continuous use of opioids I have reviewed the [facility] practitioner database information I think it is appropriate for her refills today.  She has been compliant with this regimen and continues to derive good functional improvement with her medicines.  Her refills will be for March 11 and April 10.  Return to clinic in 2 months.  4. DDD (degenerative disc disease), lumbar Continue core stretching activities  5. Right sided sciatica As above  6. Polycystic kidney disease Continue follow-up with her nephrologist and primary care physicians for baseline medical care.  7. Chronic pain syndrome     ----------------------------------------------------------------------------------------------------------------------  I am having Exie Parody. Dorrough start on HYDROcodone-acetaminophen and morphine. I am also having her maintain her naloxone, Tolvaptan, amLODipine, lisinopril, cyclobenzaprine, amoxicillin-clavulanate, HYDROcodone-acetaminophen, and morphine.   Meds ordered this encounter  Medications   HYDROcodone-acetaminophen (NORCO) 10-325 MG tablet    Sig: Take 1 tablet by mouth 2 (two) times daily.    Dispense:  45 tablet    Refill:  0   HYDROcodone-acetaminophen (NORCO) 10-325 MG tablet    Sig:  Take 1 tablet by mouth every 6 (six) hours as needed for moderate pain or severe pain.    Dispense:  45 tablet    Refill:  0   morphine (MS CONTIN) 60 MG 12 hr tablet    Sig: Take 1 tablet (60 mg total) by mouth 3 (three) times daily.    Dispense:  75 tablet    Refill:  0   morphine (MS CONTIN) 60 MG 12 hr tablet    Sig: Take 1 tablet (60 mg total) by mouth 3 (three) times daily.    Dispense:  75 tablet    Refill:  0   Patient's Medications  New Prescriptions   HYDROCODONE-ACETAMINOPHEN (NORCO) 10-325 MG TABLET    Take 1 tablet by mouth every 6 (six) hours as needed for moderate pain or severe pain.   MORPHINE (MS CONTIN) 60 MG 12 HR TABLET    Take 1 tablet (60 mg total)  by mouth 3 (three) times daily.  Previous Medications   AMLODIPINE (NORVASC) 10 MG TABLET       AMOXICILLIN-CLAVULANATE (AUGMENTIN) 875-125 MG TABLET    Take 1 tablet by mouth 2 (two) times daily.   CYCLOBENZAPRINE (FLEXERIL) 10 MG TABLET    TAKE 1 TABLET(10 MG) BY MOUTH TWICE DAILY   LISINOPRIL (ZESTRIL) 10 MG TABLET    TAKE 1 TABLET(10 MG) BY MOUTH DAILY   NALOXONE (NARCAN) NASAL SPRAY 4 MG/0.1 ML    For excess sedation from opioids.   TOLVAPTAN 90 & 30 MG TBPK    Take 1 tablet by mouth 2 (two) times daily. Takes 60 mg in the AM and 30 mg 8 hours later.  Modified Medications   Modified Medication Previous Medication   HYDROCODONE-ACETAMINOPHEN (NORCO) 10-325 MG TABLET HYDROcodone-acetaminophen (NORCO) 10-325 MG tablet      Take 1 tablet by mouth 2 (two) times daily.    Take 1 tablet by mouth 2 (two) times daily.   MORPHINE (MS CONTIN) 60 MG 12 HR TABLET morphine (MS CONTIN) 60 MG 12 hr tablet      Take 1 tablet (60 mg total) by mouth 3 (three) times daily.    Take 1 tablet (60 mg total) by mouth 3 (three) times daily.  Discontinued Medications   No medications on file   ----------------------------------------------------------------------------------------------------------------------  Follow-up: Return in about  2 months (around 08/13/2021) for evaluation, med refill.    Molli Barrows, MD

## 2021-06-29 DIAGNOSIS — I729 Aneurysm of unspecified site: Secondary | ICD-10-CM | POA: Diagnosis not present

## 2021-07-06 ENCOUNTER — Other Ambulatory Visit: Payer: Self-pay | Admitting: Family Medicine

## 2021-07-14 DIAGNOSIS — Q613 Polycystic kidney, unspecified: Secondary | ICD-10-CM | POA: Diagnosis not present

## 2021-07-14 DIAGNOSIS — I668 Occlusion and stenosis of other cerebral arteries: Secondary | ICD-10-CM | POA: Diagnosis not present

## 2021-07-14 DIAGNOSIS — I671 Cerebral aneurysm, nonruptured: Secondary | ICD-10-CM | POA: Diagnosis not present

## 2021-08-04 ENCOUNTER — Other Ambulatory Visit: Payer: Self-pay | Admitting: Family Medicine

## 2021-08-04 DIAGNOSIS — R809 Proteinuria, unspecified: Secondary | ICD-10-CM | POA: Diagnosis not present

## 2021-08-04 DIAGNOSIS — Q612 Polycystic kidney, adult type: Secondary | ICD-10-CM | POA: Diagnosis not present

## 2021-08-04 DIAGNOSIS — N200 Calculus of kidney: Secondary | ICD-10-CM | POA: Diagnosis not present

## 2021-08-04 DIAGNOSIS — I10 Essential (primary) hypertension: Secondary | ICD-10-CM | POA: Diagnosis not present

## 2021-08-13 ENCOUNTER — Ambulatory Visit: Payer: Medicare Other | Attending: Anesthesiology | Admitting: Anesthesiology

## 2021-08-13 ENCOUNTER — Other Ambulatory Visit: Payer: Self-pay | Admitting: Anesthesiology

## 2021-08-13 ENCOUNTER — Other Ambulatory Visit: Payer: Self-pay | Admitting: Nephrology

## 2021-08-13 DIAGNOSIS — M5442 Lumbago with sciatica, left side: Secondary | ICD-10-CM

## 2021-08-13 DIAGNOSIS — M47816 Spondylosis without myelopathy or radiculopathy, lumbar region: Secondary | ICD-10-CM

## 2021-08-13 DIAGNOSIS — M51369 Other intervertebral disc degeneration, lumbar region without mention of lumbar back pain or lower extremity pain: Secondary | ICD-10-CM

## 2021-08-13 DIAGNOSIS — M5431 Sciatica, right side: Secondary | ICD-10-CM

## 2021-08-13 DIAGNOSIS — F1190 Opioid use, unspecified, uncomplicated: Secondary | ICD-10-CM | POA: Diagnosis not present

## 2021-08-13 DIAGNOSIS — N200 Calculus of kidney: Secondary | ICD-10-CM

## 2021-08-13 DIAGNOSIS — G894 Chronic pain syndrome: Secondary | ICD-10-CM | POA: Diagnosis not present

## 2021-08-13 DIAGNOSIS — Q612 Polycystic kidney, adult type: Secondary | ICD-10-CM

## 2021-08-13 DIAGNOSIS — G8929 Other chronic pain: Secondary | ICD-10-CM

## 2021-08-13 DIAGNOSIS — M5136 Other intervertebral disc degeneration, lumbar region: Secondary | ICD-10-CM

## 2021-08-13 MED ORDER — HYDROCODONE-ACETAMINOPHEN 10-325 MG PO TABS: 1.0000 | ORAL_TABLET | Freq: Four times a day (QID) | ORAL | 0 refills | Status: DC | PRN

## 2021-08-13 MED ORDER — HYDROCODONE-ACETAMINOPHEN 10-325 MG PO TABS
1.0000 | ORAL_TABLET | Freq: Four times a day (QID) | ORAL | 0 refills | Status: AC | PRN
Start: 2021-08-25 — End: 2021-09-24

## 2021-08-13 MED ORDER — MORPHINE SULFATE ER 60 MG PO TBCR: 60.0000 mg | EXTENDED_RELEASE_TABLET | Freq: Three times a day (TID) | ORAL | 0 refills | Status: DC

## 2021-08-13 NOTE — Progress Notes (Signed)
Virtual Visit via Telephone Note ? ?I connected with Megan Brock on 08/13/21 at 11:00 AM EDT by telephone and verified that I am speaking with the correct person using two identifiers. ? ?Location: ?Patient: Home ?Provider: Pain control center ?  ?I discussed the limitations, risks, security and privacy concerns of performing an evaluation and management service by telephone and the availability of in person appointments. I also discussed with the patient that there may be a patient responsible charge related to this service. The patient expressed understanding and agreed to proceed. ? ? ?History of Present Illness: ?I spoke with Megan Brock via telephone today as we are unable to link for the video portion of the conference but she reports that her low back pain has been stable recently.  She is having some sciatica on the right side but at this point it is tolerable.  She has had epidural steroids for this in the past and those were effective with her last being in March of last year.  She is trying to do her physical therapy exercises and these generally help with some of the spasm but the spasm in the lower leg has been worse.  No change in lower extremity strength or function is noted at this time.  She did recently have a angiogram to rule out associated problems with her polycystic kidney disease and this was reportedly negative.  She is taking her medications as prescribed and these continue to work well for her and offer her a functional productive life where this was not achievable in the past with her ongoing pain and flank pain no change in lower extremity strength or function or bowel or bladder function is noted at this time. ? ?Review of systems: ?General: No fevers or chills ?Pulmonary: No shortness of breath or dyspnea ?Cardiac: No angina or palpitations or lightheadedness ?GI: No abdominal pain or constipation ?Psych: No depression  ?  ?Observations/Objective: ? ?Current Outpatient Medications:  ?   [START ON 09/24/2021] HYDROcodone-acetaminophen (NORCO) 10-325 MG tablet, Take 1 tablet by mouth every 6 (six) hours as needed for moderate pain or severe pain., Disp: 45 tablet, Rfl: 0 ?  [START ON 09/24/2021] morphine (MS CONTIN) 60 MG 12 hr tablet, Take 1 tablet (60 mg total) by mouth 3 (three) times daily., Disp: 75 tablet, Rfl: 0 ?  amLODipine (NORVASC) 10 MG tablet, , Disp: , Rfl:  ?  amoxicillin-clavulanate (AUGMENTIN) 875-125 MG tablet, Take 1 tablet by mouth 2 (two) times daily. (Patient not taking: Reported on 06/15/2021), Disp: 14 tablet, Rfl: 0 ?  cyclobenzaprine (FLEXERIL) 10 MG tablet, TAKE 1 TABLET(10 MG) BY MOUTH TWICE DAILY, Disp: 60 tablet, Rfl: 3 ?  [START ON 08/25/2021] HYDROcodone-acetaminophen (NORCO) 10-325 MG tablet, Take 1 tablet by mouth every 6 (six) hours as needed for moderate pain or severe pain., Disp: 45 tablet, Rfl: 0 ?  lisinopril (ZESTRIL) 10 MG tablet, TAKE 1 TABLET(10 MG) BY MOUTH DAILY, Disp: 30 tablet, Rfl: 0 ?  [START ON 08/25/2021] morphine (MS CONTIN) 60 MG 12 hr tablet, Take 1 tablet (60 mg total) by mouth 3 (three) times daily., Disp: 75 tablet, Rfl: 0 ?  naloxone (NARCAN) nasal spray 4 mg/0.1 mL, For excess sedation from opioids., Disp: 1 kit, Rfl: 2 ?  Tolvaptan 90 & 30 MG TBPK, Take 1 tablet by mouth 2 (two) times daily. Takes 60 mg in the AM and 30 mg 8 hours later., Disp: , Rfl:   ? ?Past Medical History:  ?Diagnosis Date  ?  COVID-19 virus infection 05/2020  ? Hematuria   ? History of chicken pox   ? Hypertension   ? Nephrolithiasis   ? Polycystic kidney disease   ? Right sided sciatica 11/13/2019  ?  ? ?Assessment and Plan: ?1. Lumbar spondylosis   ?2. Chronic bilateral low back pain with left-sided sciatica   ?3. Chronic, continuous use of opioids   ?4. Chronic pain syndrome   ?5. DDD (degenerative disc disease), lumbar   ?6. Right sided sciatica   ?Based on our assessment today and after review of the [facility] practitioner database information it is appropriate to  refill her medicines for the next 2 months and this will be done today.  I want her to continue follow-up with her primary care physicians for baseline medical care.  Continue to follow along with nephrology and continue with stretching strengthening exercises.  We have talked about a possible epidural steroid injection within the next 2 to 4 months if the persistent right sciatica does not remit.  No other changes are initiated today.  Follow-up will be in 2 months ? ?Follow Up Instructions: ? ?  ?I discussed the assessment and treatment plan with the patient. The patient was provided an opportunity to ask questions and all were answered. The patient agreed with the plan and demonstrated an understanding of the instructions. ?  ?The patient was advised to call back or seek an in-person evaluation if the symptoms worsen or if the condition fails to improve as anticipated. ? ?I provided 30 minutes of non-face-to-face time during this encounter. ? ? ?Molli Barrows, MD  ?

## 2021-08-17 DIAGNOSIS — Q612 Polycystic kidney, adult type: Secondary | ICD-10-CM | POA: Diagnosis not present

## 2021-08-17 DIAGNOSIS — N200 Calculus of kidney: Secondary | ICD-10-CM | POA: Diagnosis not present

## 2021-08-21 ENCOUNTER — Ambulatory Visit
Admission: RE | Admit: 2021-08-21 | Discharge: 2021-08-21 | Disposition: A | Discharge status: Home / Self Care | Payer: Medicare Other | Source: Ambulatory Visit | Attending: Nephrology | Admitting: Nephrology

## 2021-08-21 DIAGNOSIS — Q612 Polycystic kidney, adult type: Secondary | ICD-10-CM | POA: Diagnosis not present

## 2021-08-21 DIAGNOSIS — N200 Calculus of kidney: Secondary | ICD-10-CM | POA: Diagnosis not present

## 2021-08-21 DIAGNOSIS — N2881 Hypertrophy of kidney: Secondary | ICD-10-CM | POA: Diagnosis not present

## 2021-08-25 ENCOUNTER — Telehealth: Payer: Self-pay | Admitting: Anesthesiology

## 2021-08-25 NOTE — Telephone Encounter (Signed)
Pt stated that the Pharmacy does not have her Flexril. Please call Pt and Pharmacy with an update. ?

## 2021-08-28 ENCOUNTER — Other Ambulatory Visit: Payer: Self-pay | Admitting: *Deleted

## 2021-08-28 ENCOUNTER — Telehealth: Payer: Self-pay | Admitting: Anesthesiology

## 2021-08-28 MED ORDER — CYCLOBENZAPRINE HCL 10 MG PO TABS: ORAL_TABLET | ORAL | 3 refills | Status: DC

## 2021-08-28 NOTE — Telephone Encounter (Signed)
Message to [facility] and Flexeril sent.  ?

## 2021-08-28 NOTE — Telephone Encounter (Signed)
Patient stated that she called in early this week for med to be refill.She will be out of meds soon. Please give patient a call. Thanks ?

## 2021-09-02 ENCOUNTER — Other Ambulatory Visit: Payer: Self-pay | Admitting: Family Medicine

## 2021-10-05 ENCOUNTER — Ambulatory Visit: Payer: Medicare Other | Attending: Anesthesiology | Admitting: Anesthesiology

## 2021-10-05 ENCOUNTER — Encounter: Payer: Self-pay | Admitting: Anesthesiology

## 2021-10-05 DIAGNOSIS — M5136 Other intervertebral disc degeneration, lumbar region: Secondary | ICD-10-CM

## 2021-10-05 DIAGNOSIS — G8929 Other chronic pain: Secondary | ICD-10-CM

## 2021-10-05 DIAGNOSIS — G894 Chronic pain syndrome: Secondary | ICD-10-CM | POA: Diagnosis not present

## 2021-10-05 DIAGNOSIS — M5442 Lumbago with sciatica, left side: Secondary | ICD-10-CM | POA: Diagnosis not present

## 2021-10-05 DIAGNOSIS — M47816 Spondylosis without myelopathy or radiculopathy, lumbar region: Secondary | ICD-10-CM | POA: Diagnosis not present

## 2021-10-05 DIAGNOSIS — M5431 Sciatica, right side: Secondary | ICD-10-CM

## 2021-10-05 DIAGNOSIS — F1190 Opioid use, unspecified, uncomplicated: Secondary | ICD-10-CM

## 2021-10-05 DIAGNOSIS — Q613 Polycystic kidney, unspecified: Secondary | ICD-10-CM

## 2021-10-05 MED ORDER — HYDROCODONE-ACETAMINOPHEN 10-325 MG PO TABS: 1.0000 | ORAL_TABLET | Freq: Four times a day (QID) | ORAL | 0 refills | Status: DC | PRN

## 2021-10-05 MED ORDER — HYDROCODONE-ACETAMINOPHEN 10-325 MG PO TABS: 1.0000 | ORAL_TABLET | Freq: Four times a day (QID) | ORAL | 0 refills | Status: AC | PRN

## 2021-10-05 MED ORDER — MORPHINE SULFATE ER 60 MG PO TBCR: 60.0000 mg | EXTENDED_RELEASE_TABLET | Freq: Three times a day (TID) | ORAL | 0 refills | Status: DC

## 2021-10-06 NOTE — Progress Notes (Signed)
Virtual Visit via Telephone Note  I connected with Megan Brock on 10/06/21 at  1:00 PM EDT by telephone and verified that I am speaking with the correct person using two identifiers.  Location: Patient: Home Provider: Pain control center   I discussed the limitations, risks, security and privacy concerns of performing an evaluation and management service by telephone and the availability of in person appointments. I also discussed with the patient that there may be a patient responsible charge related to this service. The patient expressed understanding and agreed to proceed.   History of Present Illness: I spoke with Megan Brock via telephone today.  We were unable link for the video portion of the virtual conference.  She states that she is doing reasonably well with her back pain and her sciatica remains under reasonable control following her most recent epidural steroid injection.  She continues to have flank pain secondary to her polycystic kidney disease.  She continues to take her medications to help with the flank pain and low back pain.  The opioids continue to give her good relief throughout the day.  She tolerates the medications without side effect and continues to have approximately 75 to 80% relief when she is taking her medications regularly.  She takes them as prescribed no illicit or diverting activity.  No side effects are reported.  The quality characteristic and distribution of the pain remained stable in nature.  Otherwise she is in her usual state of health.  No change in lower extremity strength function or bowel or bladder function is noted.  Review of systems: General: No fevers or chills Pulmonary: No shortness of breath or dyspnea Cardiac: No angina or palpitations or lightheadedness GI: No abdominal pain or constipation Psych: No depression    Observations/Objective:  Current Outpatient Medications:    [START ON 11/23/2021] HYDROcodone-acetaminophen (NORCO)  10-325 MG tablet, Take 1 tablet by mouth every 6 (six) hours as needed for moderate pain or severe pain., Disp: 45 tablet, Rfl: 0   amLODipine (NORVASC) 10 MG tablet, , Disp: , Rfl:    amoxicillin-clavulanate (AUGMENTIN) 875-125 MG tablet, Take 1 tablet by mouth 2 (two) times daily. (Patient not taking: Reported on 06/15/2021), Disp: 14 tablet, Rfl: 0   cyclobenzaprine (FLEXERIL) 10 MG tablet, TAKE 1 TABLET(10 MG) BY MOUTH TWICE DAILY, Disp: 60 tablet, Rfl: 3   [START ON 10/24/2021] HYDROcodone-acetaminophen (NORCO) 10-325 MG tablet, Take 1 tablet by mouth every 6 (six) hours as needed for moderate pain or severe pain., Disp: 45 tablet, Rfl: 0   lisinopril (ZESTRIL) 10 MG tablet, TAKE 1 TABLET(10 MG) BY MOUTH DAILY, Disp: 14 tablet, Rfl: 0   [START ON 10/24/2021] morphine (MS CONTIN) 60 MG 12 hr tablet, Take 1 tablet (60 mg total) by mouth 3 (three) times daily., Disp: 75 tablet, Rfl: 0   [START ON 11/23/2021] morphine (MS CONTIN) 60 MG 12 hr tablet, Take 1 tablet (60 mg total) by mouth 3 (three) times daily., Disp: 75 tablet, Rfl: 0   naloxone (NARCAN) nasal spray 4 mg/0.1 mL, For excess sedation from opioids., Disp: 1 kit, Rfl: 2   Tolvaptan 90 & 30 MG TBPK, Take 1 tablet by mouth 2 (two) times daily. Takes 60 mg in the AM and 30 mg 8 hours later., Disp: , Rfl:    Past Medical History:  Diagnosis Date   COVID-19 virus infection 05/2020   Hematuria    History of chicken pox    Hypertension    Nephrolithiasis  Polycystic kidney disease    Right sided sciatica 11/13/2019     Assessment and Plan: 1. Lumbar spondylosis   2. Chronic bilateral low back pain with left-sided sciatica   3. Chronic, continuous use of opioids   4. Chronic pain syndrome   5. DDD (degenerative disc disease), lumbar   6. Right sided sciatica   7. Polycystic kidney disease   Based on our discussion today I think is appropriate to continue her current opioid regimen.  Refills will be given for the next 2 months.  These be  dated for July 8 and August 7 with a schedule return to clinic in 2 months.  No other changes in her pharmacologic regimen will be initiated.  She is a candidate for repeat epidural if her sciatica symptoms recur.  Presently her back pain is reasonably well controlled and polycystic kidney pain disease pain similarly with her current opioid regimen.  Continue follow-up with her nephrologist and primary care physicians for baseline medical care.  Follow Up Instructions:    I discussed the assessment and treatment plan with the patient. The patient was provided an opportunity to ask questions and all were answered. The patient agreed with the plan and demonstrated an understanding of the instructions.   The patient was advised to call back or seek an in-person evaluation if the symptoms worsen or if the condition fails to improve as anticipated.  I provided 30 minutes of non-face-to-face time during this encounter.   Molli Barrows, MD

## 2021-10-09 ENCOUNTER — Telehealth: Payer: Medicare Other | Admitting: Physician Assistant

## 2021-10-09 DIAGNOSIS — J019 Acute sinusitis, unspecified: Secondary | ICD-10-CM

## 2021-10-09 DIAGNOSIS — B9689 Other specified bacterial agents as the cause of diseases classified elsewhere: Secondary | ICD-10-CM | POA: Diagnosis not present

## 2021-10-09 MED ORDER — AMOXICILLIN-POT CLAVULANATE 875-125 MG PO TABS: 1.0000 | ORAL_TABLET | Freq: Two times a day (BID) | ORAL | 0 refills | Status: DC

## 2021-11-04 DIAGNOSIS — R809 Proteinuria, unspecified: Secondary | ICD-10-CM | POA: Diagnosis not present

## 2021-11-04 DIAGNOSIS — Q612 Polycystic kidney, adult type: Secondary | ICD-10-CM | POA: Diagnosis not present

## 2021-11-17 DIAGNOSIS — R809 Proteinuria, unspecified: Secondary | ICD-10-CM | POA: Diagnosis not present

## 2021-11-17 DIAGNOSIS — Q612 Polycystic kidney, adult type: Secondary | ICD-10-CM | POA: Diagnosis not present

## 2021-11-17 DIAGNOSIS — N1831 Chronic kidney disease, stage 3a: Secondary | ICD-10-CM | POA: Diagnosis not present

## 2021-11-17 DIAGNOSIS — I10 Essential (primary) hypertension: Secondary | ICD-10-CM | POA: Diagnosis not present

## 2021-12-07 ENCOUNTER — Telehealth: Payer: Self-pay

## 2021-12-07 ENCOUNTER — Ambulatory Visit: Payer: Medicare Other | Admitting: Anesthesiology

## 2021-12-09 ENCOUNTER — Other Ambulatory Visit: Payer: Self-pay

## 2021-12-09 ENCOUNTER — Ambulatory Visit
Admission: RE | Admit: 2021-12-09 | Discharge: 2021-12-09 | Disposition: A | Discharge status: Home / Self Care | Payer: Medicare Other | Source: Ambulatory Visit | Attending: Anesthesiology | Admitting: Anesthesiology

## 2021-12-09 ENCOUNTER — Ambulatory Visit (HOSPITAL_BASED_OUTPATIENT_CLINIC_OR_DEPARTMENT_OTHER): Payer: Medicare Other | Admitting: Anesthesiology

## 2021-12-09 ENCOUNTER — Encounter: Payer: Self-pay | Admitting: Anesthesiology

## 2021-12-09 ENCOUNTER — Other Ambulatory Visit: Payer: Self-pay | Admitting: Anesthesiology

## 2021-12-09 VITALS — BP 103/68 | HR 97 | Temp 97.2°F | Resp 18 | Ht 63.0 in | Wt 170.0 lb

## 2021-12-09 DIAGNOSIS — Q613 Polycystic kidney, unspecified: Secondary | ICD-10-CM | POA: Insufficient documentation

## 2021-12-09 DIAGNOSIS — G8929 Other chronic pain: Secondary | ICD-10-CM | POA: Insufficient documentation

## 2021-12-09 DIAGNOSIS — M47816 Spondylosis without myelopathy or radiculopathy, lumbar region: Secondary | ICD-10-CM | POA: Diagnosis not present

## 2021-12-09 DIAGNOSIS — M5442 Lumbago with sciatica, left side: Secondary | ICD-10-CM

## 2021-12-09 DIAGNOSIS — G894 Chronic pain syndrome: Secondary | ICD-10-CM | POA: Insufficient documentation

## 2021-12-09 DIAGNOSIS — R52 Pain, unspecified: Secondary | ICD-10-CM

## 2021-12-09 DIAGNOSIS — F1190 Opioid use, unspecified, uncomplicated: Secondary | ICD-10-CM | POA: Diagnosis present

## 2021-12-09 DIAGNOSIS — M51369 Other intervertebral disc degeneration, lumbar region without mention of lumbar back pain or lower extremity pain: Secondary | ICD-10-CM

## 2021-12-09 DIAGNOSIS — M5432 Sciatica, left side: Secondary | ICD-10-CM | POA: Insufficient documentation

## 2021-12-09 DIAGNOSIS — M5431 Sciatica, right side: Secondary | ICD-10-CM | POA: Diagnosis present

## 2021-12-09 DIAGNOSIS — M5136 Other intervertebral disc degeneration, lumbar region: Secondary | ICD-10-CM | POA: Insufficient documentation

## 2021-12-09 MED ORDER — LIDOCAINE HCL (PF) 1 % IJ SOLN
INTRAMUSCULAR | Status: AC
  Filled 2021-12-09: qty 10

## 2021-12-09 MED ORDER — CYCLOBENZAPRINE HCL 10 MG PO TABS: ORAL_TABLET | ORAL | 3 refills | Status: DC

## 2021-12-09 MED ORDER — TRIAMCINOLONE ACETONIDE 40 MG/ML IJ SUSP
INTRAMUSCULAR | Status: AC
  Filled 2021-12-09: qty 1

## 2021-12-09 MED ORDER — IOPAMIDOL (ISOVUE-M 200) INJECTION 41%
20.0000 mL | Freq: Once | INTRAMUSCULAR | Status: DC | PRN
  Administered 2021-12-09: 20 mL

## 2021-12-09 MED ORDER — HYDROCODONE-ACETAMINOPHEN 10-325 MG PO TABS: 1.0000 | ORAL_TABLET | Freq: Four times a day (QID) | ORAL | 0 refills | Status: AC | PRN

## 2021-12-09 MED ORDER — ROPIVACAINE HCL 2 MG/ML IJ SOLN
INTRAMUSCULAR | Status: AC
  Filled 2021-12-09: qty 20

## 2021-12-09 MED ORDER — HYDROCODONE-ACETAMINOPHEN 10-325 MG PO TABS: 1.0000 | ORAL_TABLET | Freq: Four times a day (QID) | ORAL | 0 refills | Status: DC | PRN

## 2021-12-09 MED ORDER — ROPIVACAINE HCL 2 MG/ML IJ SOLN
10.0000 mL | Freq: Once | INTRAMUSCULAR | Status: AC
  Administered 2021-12-09: 1 mL via EPIDURAL

## 2021-12-09 MED ORDER — MORPHINE SULFATE ER 60 MG PO TBCR: 60.0000 mg | EXTENDED_RELEASE_TABLET | Freq: Three times a day (TID) | ORAL | 0 refills | Status: DC

## 2021-12-09 MED ORDER — IOHEXOL 180 MG/ML  SOLN
INTRAMUSCULAR | Status: AC
  Filled 2021-12-09: qty 20

## 2021-12-09 MED ORDER — SODIUM CHLORIDE (PF) 0.9 % IJ SOLN
INTRAMUSCULAR | Status: AC
  Filled 2021-12-09: qty 10

## 2021-12-09 MED ORDER — TRIAMCINOLONE ACETONIDE 40 MG/ML IJ SUSP
40.0000 mg | Freq: Once | INTRAMUSCULAR | Status: AC
  Administered 2021-12-09: 40 mg

## 2021-12-09 MED ORDER — SODIUM CHLORIDE 0.9% FLUSH
10.0000 mL | Freq: Once | INTRAVENOUS | Status: AC
  Administered 2021-12-09: 10 mL

## 2021-12-09 MED ORDER — LIDOCAINE HCL (PF) 1 % IJ SOLN
5.0000 mL | Freq: Once | INTRAMUSCULAR | Status: AC
  Administered 2021-12-09: 5 mL via SUBCUTANEOUS

## 2021-12-09 NOTE — Progress Notes (Signed)
Nursing Pain Medication Assessment:  Safety precautions to be maintained throughout the outpatient stay will include: orient to surroundings, keep bed in low position, maintain call bell within reach at all times, provide assistance with transfer out of bed and ambulation.  Medication Inspection Compliance: Pill count conducted under aseptic conditions, in front of the patient. Neither the pills nor the bottle was removed from the patient's sight at any time. Once count was completed pills were immediately returned to the patient in their original bottle.  Medication #1: Morphine ER (MSContin) Pill/Patch Count:  35 of 75 pills remain Pill/Patch Appearance: Markings consistent with prescribed medication Bottle Appearance: Standard pharmacy container. Clearly labeled. Filled Date: 08 / 06 / 2023 Last Medication intake:  Today  Medication #2: Hydrocodone/APAP Pill/Patch Count:  20 of 45 pills remain Pill/Patch Appearance: Markings consistent with prescribed medication Bottle Appearance: Standard pharmacy container. Clearly labeled. Filled Date: 08 / 08 / 2023 Last Medication intake:  Today

## 2021-12-09 NOTE — Patient Instructions (Signed)
Pain Management Discharge Instructions  General Discharge Instructions :  If you need to reach your doctor call: Monday-Friday 8:00 am - 4:00 pm at [phone] or toll free [phone].  After clinic hours [phone] to have operator reach doctor.  Bring all of your medication bottles to all your appointments in the pain clinic.  To cancel or reschedule your appointment with Pain Management please remember to call 24 hours in advance to avoid a fee.  Refer to the educational materials which you have been given on: General Risks, I had my Procedure. Discharge Instructions, Post Sedation.  Post Procedure Instructions:  The drugs you were given will stay in your system until tomorrow, so for the next 24 hours you should not drive, make any legal decisions or drink any alcoholic beverages.  You may eat anything you prefer, but it is better to start with liquids then soups and crackers, and gradually work up to solid foods.  Please notify your doctor immediately if you have any unusual bleeding, trouble breathing or pain that is not related to your normal pain.  Depending on the type of procedure that was done, some parts of your body may feel week and/or numb.  This usually clears up by tonight or the next day.  Walk with the use of an assistive device or accompanied by an adult for the 24 hours.  You may use ice on the affected area for the first 24 hours.  Put ice in a Ziploc bag and cover with a towel and place against area 15 minutes on 15 minutes off.  You may switch to heat after 24 hours.Epidural Steroid Injection Patient Information  Description: The epidural space surrounds the nerves as they exit the spinal cord.  In some patients, the nerves can be compressed and inflamed by a bulging disc or a tight spinal canal (spinal stenosis).  By injecting steroids into the epidural space, we can bring irritated nerves into direct contact with a potentially helpful medication.  These  steroids act directly on the irritated nerves and can reduce swelling and inflammation which often leads to decreased pain.  Epidural steroids may be injected anywhere along the spine and from the neck to the low back depending upon the location of your pain.   After numbing the skin with local anesthetic (like Novocaine), a small needle is passed into the epidural space slowly.  You may experience a sensation of pressure while this is being done.  The entire block usually last less than 10 minutes.  Conditions which may be treated by epidural steroids:  Low back and leg pain Neck and arm pain Spinal stenosis Post-laminectomy syndrome Herpes zoster (shingles) pain Pain from compression fractures  Preparation for the injection:  Do not eat any solid food or dairy products within 8 hours of your appointment.  You may drink clear liquids up to 3 hours before appointment.  Clear liquids include water, black coffee, juice or soda.  No milk or cream please. You may take your regular medication, including pain medications, with a sip of water before your appointment  Diabetics should hold regular insulin (if taken separately) and take 1/2 normal NPH dos the morning of the procedure.  Carry some sugar containing items with you to your appointment. A driver must accompany you and be prepared to drive you home after your procedure.  Bring all your current medications with your. An IV may be inserted and sedation may be given at the discretion of the physician.     A blood pressure cuff, EKG and other monitors will often be applied during the procedure.  Some patients may need to have extra oxygen administered for a short period. You will be asked to provide medical information, including your allergies, prior to the procedure.  We must know immediately if you are taking blood thinners (like Coumadin/Warfarin)  Or if you are allergic to IV iodine contrast (dye). We must know if you could possible be  pregnant.  Possible side-effects: Bleeding from needle site Infection (rare, may require surgery) Nerve injury (rare) Numbness & tingling (temporary) Difficulty urinating (rare, temporary) Spinal headache ( a headache worse with upright posture) Light -headedness (temporary) Pain at injection site (several days) Decreased blood pressure (temporary) Weakness in arm/leg (temporary) Pressure sensation in back/neck (temporary)  Call if you experience: Fever/chills associated with headache or increased back/neck pain. Headache worsened by an upright position. New onset weakness or numbness of an extremity below the injection site Hives or difficulty breathing (go to the emergency room) Inflammation or drainage at the infection site Severe back/neck pain Any new symptoms which are concerning to you  Please note:  Although the local anesthetic injected can often make your back or neck feel good for several hours after the injection, the pain will likely return.  It takes 3-7 days for steroids to work in the epidural space.  You may not notice any pain relief for at least that one week.  If effective, we will often do a series of three injections spaced 3-6 weeks apart to maximally decrease your pain.  After the initial series, we generally will wait several months before considering a repeat injection of the same type.  If you have any questions, please call [phone] [facility] 

## 2021-12-10 ENCOUNTER — Telehealth: Payer: Self-pay | Admitting: *Deleted

## 2021-12-10 NOTE — Telephone Encounter (Signed)
No problems post procedure. 

## 2021-12-22 ENCOUNTER — Encounter: Payer: Self-pay | Admitting: Anesthesiology

## 2021-12-22 NOTE — Progress Notes (Signed)
Subjective:  Patient ID: Megan Brock, female    DOB: Jan 11, 1981  Age: 41 y.o. MRN: [id number]  CC: Back Pain (Lumbar left is worse )   Procedure: L5-S1 epidural steroid under fluoroscopic guidance with no sedation  HPI [name] presents for reevaluation.  She is having a lot of right lower extremity sciatica and some persistent low back pain.  This is consistent with what she has had in the past.  No change in lower extremity strength or function or bowel or bladder function is noted.  [name] gets periodic epidurals to help with her persistent sciatica.  These generally give her about 75 to 90% relief lasting about 2 to 3 months before she gets a gradual recurrence of similar pain.  Her last epidural was reported to be back in March 2022.  She is trying to stay active and she has tried to do physical therapy stretching strengthening exercises but unfortunately this current bout of sciatica has been persistent and unremitting and she has failed more conservative therapy.  She desires to proceed with an epidural steroid injection today.  Furthermore she is taking her medications as prescribed and these continue to work well for her chronic low back pain and polycystic kidney pain.  This regimen is without side effect and the quality characteristic and distribution of her pain has been stable in nature.  No other changes are noted.  She continues to follow along with her nephrologist.  Outpatient Medications Prior to Visit  Medication Sig Dispense Refill   hydrochlorothiazide (HYDRODIURIL) 25 MG tablet Take 25 mg by mouth daily.     lisinopril (ZESTRIL) 10 MG tablet TAKE 1 TABLET(10 MG) BY MOUTH DAILY 14 tablet 0   morphine (MS CONTIN) 60 MG 12 hr tablet Take 1 tablet (60 mg total) by mouth 3 (three) times daily. 75 tablet 0   naloxone (NARCAN) nasal spray 4 mg/0.1 mL For excess sedation from opioids. 1 kit 2   Tolvaptan 90 & 30 MG TBPK Take 1 tablet by mouth 2 (two) times daily. Takes 60  mg in the AM and 30 mg 8 hours later.     cyclobenzaprine (FLEXERIL) 10 MG tablet TAKE 1 TABLET(10 MG) BY MOUTH TWICE DAILY 60 tablet 3   HYDROcodone-acetaminophen (NORCO) 10-325 MG tablet Take 1 tablet by mouth every 6 (six) hours as needed for moderate pain or severe pain. 45 tablet 0   amLODipine (NORVASC) 10 MG tablet      amoxicillin-clavulanate (AUGMENTIN) 875-125 MG tablet Take 1 tablet by mouth 2 (two) times daily. (Patient not taking: Reported on 12/09/2021) 14 tablet 0   morphine (MS CONTIN) 60 MG 12 hr tablet Take 1 tablet (60 mg total) by mouth 3 (three) times daily. 75 tablet 0   No facility-administered medications prior to visit.    Review of Systems CNS: No confusion or sedation Cardiac: No angina or palpitations GI: No abdominal pain or constipation Constitutional: No nausea vomiting fevers or chills  Objective:  BP 103/68   Pulse 97   Temp (!) 97.2 F (36.2 C) (Temporal)   Resp 18   Ht _0  (1.6 m)   Wt 170 lb (77.1 kg)   SpO2 100%   BMI 30.11 kg/m    BP Readings from Last 3 Encounters:  12/09/21 103/68  06/15/21 (!) 116/94  11/25/20 128/88     Wt Readings from Last 3 Encounters:  12/09/21 170 lb (77.1 kg)  06/15/21 170 lb (77.1 kg)  11/25/20 170 lb (  77.1 kg)     Physical Exam Pt is alert and oriented PERRL EOMI HEART IS RRR no murmur or rub LCTA no wheezing or rales MUSCULOSKELETAL reveals some paraspinous muscle tenderness in the lumbar region.  She does have chronic right flank pain with her polycystic kidney disease.  Lower extremity strength appears to be at baseline muscle tone and bulk is at baseline.  Labs  No results found for: "HGBA1C" Lab Results  Component Value Date   LDLCALC 105 (H) 07/17/2015   CREATININE 0.98 02/08/2017    -------------------------------------------------------------------------------------------------------------------- Lab Results  Component Value Date   WBC 9.2 02/08/2017   HGB 13.2 02/08/2017   HCT  37.7 02/08/2017   PLT 197 02/08/2017   GLUCOSE 87 02/08/2017   CHOL 174 07/17/2015   TRIG 139 07/17/2015   HDL 41 07/17/2015   LDLCALC 105 (H) 07/17/2015   ALT 10 02/08/2017   AST 12 02/08/2017   NA 140 02/08/2017   K 4.0 02/08/2017   CL 105 02/08/2017   CREATININE 0.98 02/08/2017   BUN 18 02/08/2017   CO2 29 02/08/2017   TSH 2.530 06/26/2019    --------------------------------------------------------------------------------------------------------------------- DG PAIN CLINIC C-ARM 1-60 MIN NO REPORT  Result Date: 12/09/2021 Fluoro was used, but no Radiologist interpretation will be provided. Please refer to "NOTES" tab for provider progress note.    Assessment & Plan:   Deaisa was seen today for back pain.  Diagnoses and all orders for this visit:  Lumbar spondylosis -     triamcinolone acetonide (KENALOG-40) injection 40 mg -     sodium chloride flush (NS) 0.9 % injection 10 mL -     ropivacaine (PF) 2 mg/mL (0.2%) (NAROPIN) injection 10 mL -     lidocaine (PF) (XYLOCAINE) 1 % injection 5 mL -     iopamidol (ISOVUE-M) 41 % intrathecal injection 20 mL  Chronic bilateral low back pain with left-sided sciatica  Chronic, continuous use of opioids  Chronic pain syndrome  DDD (degenerative disc disease), lumbar  Right sided sciatica  Polycystic kidney disease  Left sided sciatica -     triamcinolone acetonide (KENALOG-40) injection 40 mg -     sodium chloride flush (NS) 0.9 % injection 10 mL -     ropivacaine (PF) 2 mg/mL (0.2%) (NAROPIN) injection 10 mL -     lidocaine (PF) (XYLOCAINE) 1 % injection 5 mL -     iopamidol (ISOVUE-M) 41 % intrathecal injection 20 mL  Other orders -     cyclobenzaprine (FLEXERIL) 10 MG tablet; TAKE 1 TABLET(10 MG) BY MOUTH TWICE DAILY -     HYDROcodone-acetaminophen (NORCO) 10-325 MG tablet; Take 1 tablet by mouth every 6 (six) hours as needed for moderate pain or severe pain. -     morphine (MS CONTIN) 60 MG 12 hr tablet; Take 1  tablet (60 mg total) by mouth 3 (three) times daily. -     morphine (MS CONTIN) 60 MG 12 hr tablet; Take 1 tablet (60 mg total) by mouth 3 (three) times daily. -     HYDROcodone-acetaminophen (NORCO) 10-325 MG tablet; Take 1 tablet by mouth every 6 (six) hours as needed for moderate pain or severe pain.        ----------------------------------------------------------------------------------------------------------------------  Problem List Items Addressed This Visit       Unprioritized   Chronic pain syndrome (Chronic)   Relevant Medications   cyclobenzaprine (FLEXERIL) 10 MG tablet   HYDROcodone-acetaminophen (NORCO) 10-325 MG tablet   morphine (MS CONTIN) 60 MG  12 hr tablet   morphine (MS CONTIN) 60 MG 12 hr tablet (Start on 01/21/2022)   HYDROcodone-acetaminophen (NORCO) 10-325 MG tablet (Start on 01/21/2022)   Lumbar spondylosis - Primary (Chronic)   Relevant Medications   iopamidol (ISOVUE-M) 41 % intrathecal injection 20 mL   cyclobenzaprine (FLEXERIL) 10 MG tablet   HYDROcodone-acetaminophen (NORCO) 10-325 MG tablet   morphine (MS CONTIN) 60 MG 12 hr tablet   morphine (MS CONTIN) 60 MG 12 hr tablet (Start on 01/21/2022)   HYDROcodone-acetaminophen (NORCO) 10-325 MG tablet (Start on 01/21/2022)   Chronic lower back pain   Relevant Medications   cyclobenzaprine (FLEXERIL) 10 MG tablet   HYDROcodone-acetaminophen (NORCO) 10-325 MG tablet   morphine (MS CONTIN) 60 MG 12 hr tablet   morphine (MS CONTIN) 60 MG 12 hr tablet (Start on 01/21/2022)   HYDROcodone-acetaminophen (NORCO) 10-325 MG tablet (Start on 01/21/2022)   Chronic, continuous use of opioids   Right sided sciatica   Relevant Medications   cyclobenzaprine (FLEXERIL) 10 MG tablet   Other Visit Diagnoses     DDD (degenerative disc disease), lumbar       Relevant Medications   triamcinolone acetonide (KENALOG-40) injection 40 mg (Completed)   cyclobenzaprine (FLEXERIL) 10 MG tablet   HYDROcodone-acetaminophen  (NORCO) 10-325 MG tablet   morphine (MS CONTIN) 60 MG 12 hr tablet   morphine (MS CONTIN) 60 MG 12 hr tablet (Start on 01/21/2022)   HYDROcodone-acetaminophen (NORCO) 10-325 MG tablet (Start on 01/21/2022)   Polycystic kidney disease       Left sided sciatica       Relevant Medications   triamcinolone acetonide (KENALOG-40) injection 40 mg (Completed)   sodium chloride flush (NS) 0.9 % injection 10 mL (Completed)   ropivacaine (PF) 2 mg/mL (0.2%) (NAROPIN) injection 10 mL (Completed)   lidocaine (PF) (XYLOCAINE) 1 % injection 5 mL (Completed)   iopamidol (ISOVUE-M) 41 % intrathecal injection 20 mL   cyclobenzaprine (FLEXERIL) 10 MG tablet         ----------------------------------------------------------------------------------------------------------------------  1. Lumbar spondylosis Continue with core stretching strengthening exercises as reviewed - triamcinolone acetonide (KENALOG-40) injection 40 mg - sodium chloride flush (NS) 0.9 % injection 10 mL - ropivacaine (PF) 2 mg/mL (0.2%) (NAROPIN) injection 10 mL - lidocaine (PF) (XYLOCAINE) 1 % injection 5 mL - iopamidol (ISOVUE-M) 41 % intrathecal injection 20 mL  2. Chronic bilateral low back pain with left-sided sciatica We will proceed with a repeat epidural today.  We gone over the risks and benefits of the procedure in full detail all questions were answered.  3. Chronic, continuous use of opioids I reviewed the [facility] practitioner database information is appropriate for refill.  No changes in her pharmacologic regimen will be initiated.  Refills will be for September 5 and October 5.  4. Chronic pain syndrome As above  5. DDD (degenerative disc disease), lumbar As above  6. Right sided sciatica As above  7. Polycystic kidney disease Continue following with nephrology and primary care for baseline medical care  8. Left sided sciatica As above - triamcinolone acetonide (KENALOG-40) injection 40 mg -  sodium chloride flush (NS) 0.9 % injection 10 mL - ropivacaine (PF) 2 mg/mL (0.2%) (NAROPIN) injection 10 mL - lidocaine (PF) (XYLOCAINE) 1 % injection 5 mL - iopamidol (ISOVUE-M) 41 % intrathecal injection 20 mL    ----------------------------------------------------------------------------------------------------------------------  I am having Exie Parody. Castor start on morphine and HYDROcodone-acetaminophen. I am also having her maintain her naloxone, Tolvaptan, amLODipine, lisinopril, morphine, amoxicillin-clavulanate, hydrochlorothiazide, cyclobenzaprine,  HYDROcodone-acetaminophen, and morphine. We administered triamcinolone acetonide, sodium chloride flush, ropivacaine (PF) 2 mg/mL (0.2%), lidocaine (PF), and iopamidol.   Meds ordered this encounter  Medications   triamcinolone acetonide (KENALOG-40) injection 40 mg   sodium chloride flush (NS) 0.9 % injection 10 mL   ropivacaine (PF) 2 mg/mL (0.2%) (NAROPIN) injection 10 mL   lidocaine (PF) (XYLOCAINE) 1 % injection 5 mL   iopamidol (ISOVUE-M) 41 % intrathecal injection 20 mL   cyclobenzaprine (FLEXERIL) 10 MG tablet    Sig: TAKE 1 TABLET(10 MG) BY MOUTH TWICE DAILY    Dispense:  60 tablet    Refill:  3   HYDROcodone-acetaminophen (NORCO) 10-325 MG tablet    Sig: Take 1 tablet by mouth every 6 (six) hours as needed for moderate pain or severe pain.    Dispense:  45 tablet    Refill:  0   morphine (MS CONTIN) 60 MG 12 hr tablet    Sig: Take 1 tablet (60 mg total) by mouth 3 (three) times daily.    Dispense:  75 tablet    Refill:  0   morphine (MS CONTIN) 60 MG 12 hr tablet    Sig: Take 1 tablet (60 mg total) by mouth 3 (three) times daily.    Dispense:  75 tablet    Refill:  0   HYDROcodone-acetaminophen (NORCO) 10-325 MG tablet    Sig: Take 1 tablet by mouth every 6 (six) hours as needed for moderate pain or severe pain.    Dispense:  45 tablet    Refill:  0   Patient's Medications  New Prescriptions    HYDROCODONE-ACETAMINOPHEN (NORCO) 10-325 MG TABLET    Take 1 tablet by mouth every 6 (six) hours as needed for moderate pain or severe pain.   MORPHINE (MS CONTIN) 60 MG 12 HR TABLET    Take 1 tablet (60 mg total) by mouth 3 (three) times daily.  Previous Medications   AMLODIPINE (NORVASC) 10 MG TABLET       AMOXICILLIN-CLAVULANATE (AUGMENTIN) 875-125 MG TABLET    Take 1 tablet by mouth 2 (two) times daily.   HYDROCHLOROTHIAZIDE (HYDRODIURIL) 25 MG TABLET    Take 25 mg by mouth daily.   LISINOPRIL (ZESTRIL) 10 MG TABLET    TAKE 1 TABLET(10 MG) BY MOUTH DAILY   MORPHINE (MS CONTIN) 60 MG 12 HR TABLET    Take 1 tablet (60 mg total) by mouth 3 (three) times daily.   NALOXONE (NARCAN) NASAL SPRAY 4 MG/0.1 ML    For excess sedation from opioids.   TOLVAPTAN 90 & 30 MG TBPK    Take 1 tablet by mouth 2 (two) times daily. Takes 60 mg in the AM and 30 mg 8 hours later.  Modified Medications   Modified Medication Previous Medication   CYCLOBENZAPRINE (FLEXERIL) 10 MG TABLET cyclobenzaprine (FLEXERIL) 10 MG tablet      TAKE 1 TABLET(10 MG) BY MOUTH TWICE DAILY    TAKE 1 TABLET(10 MG) BY MOUTH TWICE DAILY   HYDROCODONE-ACETAMINOPHEN (NORCO) 10-325 MG TABLET HYDROcodone-acetaminophen (NORCO) 10-325 MG tablet      Take 1 tablet by mouth every 6 (six) hours as needed for moderate pain or severe pain.    Take 1 tablet by mouth every 6 (six) hours as needed for moderate pain or severe pain.   MORPHINE (MS CONTIN) 60 MG 12 HR TABLET morphine (MS CONTIN) 60 MG 12 hr tablet      Take 1 tablet (60 mg total) by mouth 3 (  three) times daily.    Take 1 tablet (60 mg total) by mouth 3 (three) times daily.  Discontinued Medications   No medications on file   ----------------------------------------------------------------------------------------------------------------------  Follow-up: Return in about 2 months (around 02/08/2022) for evaluation, med refill.   Procedure: L5-S1 LESI with fluoroscopic guidance and  without moderate sedation  NOTE: The risks, benefits, and expectations of the procedure have been discussed and explained to the patient who was understanding and in agreement with suggested treatment plan. No guarantees were made.  DESCRIPTION OF PROCEDURE: Lumbar epidural steroid injection with no IV Versed, EKG, blood pressure, pulse, and pulse oximetry monitoring. The procedure was performed with the patient in the prone position under fluoroscopic guidance.  Sterile prep x3 was initiated and I then injected subcutaneous lidocaine to the overlying L5-S1 site after its fluoroscopic identifictation.  Using strict aseptic technique, I then advanced an 18-gauge Tuohy epidural needle in the midline using interlaminar approach via loss-of-resistance to saline technique. There was negative aspiration for heme or  CSF.  I then confirmed position with both AP and Lateral fluoroscan.  2 cc of contrast dye were injected and a  total of 5 mL of Preservative-Free normal saline mixed with 40 mg of Kenalog and 1cc Ropicaine 0.2 percent were injected incrementally via the  epidurally placed needle. The needle was removed. The patient tolerated the injection well and was convalesced and discharged to home in stable condition. Should the patient have any post procedure difficulty they have been instructed on how to contact us for assistance.    Molli Barrows, MD

## 2022-01-21 ENCOUNTER — Other Ambulatory Visit: Payer: Self-pay

## 2022-01-21 ENCOUNTER — Telehealth: Payer: Self-pay | Admitting: Anesthesiology

## 2022-01-21 NOTE — Telephone Encounter (Signed)
Patient's pharmacy does not have her Hydrocodone. Wants to know if Dr. Andree Elk will send a script to [place]. Please let patient know

## 2022-01-21 NOTE — Telephone Encounter (Signed)
Refill request sent to MD and notified by message. Pharm did not have meds to fill.

## 2022-01-22 ENCOUNTER — Telehealth: Payer: Self-pay | Admitting: *Deleted

## 2022-01-22 ENCOUNTER — Telehealth: Payer: Self-pay | Admitting: Anesthesiology

## 2022-01-22 ENCOUNTER — Other Ambulatory Visit: Payer: Self-pay | Admitting: *Deleted

## 2022-01-22 NOTE — Telephone Encounter (Signed)
Attempted to call and text Dr. Andree Elk, no answer.

## 2022-01-22 NOTE — Telephone Encounter (Signed)
PT stated that she spoke with nurse on yesterday about getting her prescription send in to [place]. PT stated that she called back yesterday and today. Was told by pharmacy that prescription hadn't been call in. PT stated that Dr. Dossie Arbour was suppose to sign off for DR. Adams so prescription could be call in. Please give a patient a call. Thanks

## 2022-01-25 ENCOUNTER — Other Ambulatory Visit: Payer: Self-pay | Admitting: *Deleted

## 2022-01-25 MED ORDER — HYDROCODONE-ACETAMINOPHEN 10-325 MG PO TABS: 1.0000 | ORAL_TABLET | Freq: Four times a day (QID) | ORAL | 0 refills | Status: DC | PRN

## 2022-01-26 ENCOUNTER — Telehealth: Payer: Self-pay | Admitting: Anesthesiology

## 2022-01-26 NOTE — Telephone Encounter (Signed)
PT stated that she has been calling since last week. PT needs refil on medications. PT stated that she has been waiting on an call back. Please give patient a call. Thanks

## 2022-01-26 NOTE — Telephone Encounter (Signed)
Patient called pharm and they have the prescription.

## 2022-01-28 NOTE — Telephone Encounter (Signed)
No additional notes

## 2022-02-17 ENCOUNTER — Ambulatory Visit: Payer: Medicare Other | Attending: Anesthesiology | Admitting: Anesthesiology

## 2022-02-17 ENCOUNTER — Encounter: Payer: Self-pay | Admitting: Anesthesiology

## 2022-02-17 DIAGNOSIS — M51369 Other intervertebral disc degeneration, lumbar region without mention of lumbar back pain or lower extremity pain: Secondary | ICD-10-CM

## 2022-02-17 DIAGNOSIS — F1190 Opioid use, unspecified, uncomplicated: Secondary | ICD-10-CM

## 2022-02-17 DIAGNOSIS — M47816 Spondylosis without myelopathy or radiculopathy, lumbar region: Secondary | ICD-10-CM

## 2022-02-17 DIAGNOSIS — Q613 Polycystic kidney, unspecified: Secondary | ICD-10-CM

## 2022-02-17 DIAGNOSIS — G894 Chronic pain syndrome: Secondary | ICD-10-CM

## 2022-02-17 DIAGNOSIS — M5136 Other intervertebral disc degeneration, lumbar region: Secondary | ICD-10-CM

## 2022-02-17 DIAGNOSIS — M5432 Sciatica, left side: Secondary | ICD-10-CM

## 2022-02-17 DIAGNOSIS — Q612 Polycystic kidney, adult type: Secondary | ICD-10-CM | POA: Diagnosis not present

## 2022-02-17 DIAGNOSIS — N1831 Chronic kidney disease, stage 3a: Secondary | ICD-10-CM | POA: Diagnosis not present

## 2022-02-17 DIAGNOSIS — M5442 Lumbago with sciatica, left side: Secondary | ICD-10-CM

## 2022-02-17 DIAGNOSIS — M5431 Sciatica, right side: Secondary | ICD-10-CM

## 2022-02-17 DIAGNOSIS — G8929 Other chronic pain: Secondary | ICD-10-CM

## 2022-02-17 MED ORDER — HYDROCODONE-ACETAMINOPHEN 10-325 MG PO TABS: 1.0000 | ORAL_TABLET | Freq: Four times a day (QID) | ORAL | 0 refills | Status: DC | PRN

## 2022-02-17 MED ORDER — MORPHINE SULFATE ER 60 MG PO TBCR: 60.0000 mg | EXTENDED_RELEASE_TABLET | Freq: Three times a day (TID) | ORAL | 0 refills | Status: DC

## 2022-02-17 MED ORDER — HYDROCODONE-ACETAMINOPHEN 10-325 MG PO TABS: 1.0000 | ORAL_TABLET | Freq: Four times a day (QID) | ORAL | 0 refills | Status: AC | PRN

## 2022-02-17 NOTE — Progress Notes (Signed)
Virtual Visit via Telephone Note  I connected with Megan Brock on 02/17/22 at  1:20 PM EDT by telephone and verified that I am speaking with the correct person using two identifiers.  Location: Patient: Home Provider: [facility]   I discussed the limitations, risks, security and privacy concerns of performing an evaluation and management service by telephone and the availability of in person appointments. I also discussed with the patient that there may be a patient responsible charge related to this service. The patient expressed understanding and agreed to proceed.   History of Present Illness: I spoke with Megan Brock via telephone today.  We are unable link for the video portion of coverage which it appears that her back pain is stable in nature.  She had a recent epidural back in August and is doing well with the limited sciatica and staying active.  Her back pain continues but is stable in nature with no change in the quality characteristic or distribution.  She takes her medications as prescribed generally averaging approximately hydrocodone twice a day and her long-acting morphine 2-3 times a day.  This combination has worked well for her and she has been on this for an extended period of time with good relief.  No recent changes are noted in the nature of her pain and she is continue to get good relief.  Otherwise lower extremity strength function bowel and bladder function are stable without change.  Review of systems: General: No fevers or chills Pulmonary: No shortness of breath or dyspnea Cardiac: No angina or palpitations or lightheadedness GI: No abdominal pain or constipation Psych: No depression    Observations/Objective:  Current Outpatient Medications:    [START ON 03/22/2022] HYDROcodone-acetaminophen (NORCO) 10-325 MG tablet, Take 1 tablet by mouth every 6 (six) hours as needed for moderate pain or severe pain., Disp: 45 tablet, Rfl: 0   [START ON 03/22/2022]  morphine (MS CONTIN) 60 MG 12 hr tablet, Take 1 tablet (60 mg total) by mouth 3 (three) times daily., Disp: 75 tablet, Rfl: 0   amLODipine (NORVASC) 10 MG tablet, , Disp: , Rfl:    amoxicillin-clavulanate (AUGMENTIN) 875-125 MG tablet, Take 1 tablet by mouth 2 (two) times daily. (Patient not taking: Reported on 12/09/2021), Disp: 14 tablet, Rfl: 0   cyclobenzaprine (FLEXERIL) 10 MG tablet, TAKE 1 TABLET(10 MG) BY MOUTH TWICE DAILY, Disp: 60 tablet, Rfl: 3   hydrochlorothiazide (HYDRODIURIL) 25 MG tablet, Take 25 mg by mouth daily., Disp: , Rfl:    [START ON 02/20/2022] HYDROcodone-acetaminophen (NORCO) 10-325 MG tablet, Take 1 tablet by mouth every 6 (six) hours as needed for moderate pain or severe pain., Disp: 45 tablet, Rfl: 0   lisinopril (ZESTRIL) 10 MG tablet, TAKE 1 TABLET(10 MG) BY MOUTH DAILY, Disp: 14 tablet, Rfl: 0   morphine (MS CONTIN) 60 MG 12 hr tablet, Take 1 tablet (60 mg total) by mouth 3 (three) times daily., Disp: 75 tablet, Rfl: 0   morphine (MS CONTIN) 60 MG 12 hr tablet, Take 1 tablet (60 mg total) by mouth 3 (three) times daily., Disp: 75 tablet, Rfl: 0   [START ON 02/20/2022] morphine (MS CONTIN) 60 MG 12 hr tablet, Take 1 tablet (60 mg total) by mouth 3 (three) times daily., Disp: 75 tablet, Rfl: 0   naloxone (NARCAN) nasal spray 4 mg/0.1 mL, For excess sedation from opioids., Disp: 1 kit, Rfl: 2   Tolvaptan 90 & 30 MG TBPK, Take 1 tablet by mouth 2 (two) times daily.  Takes 60 mg in the AM and 30 mg 8 hours later., Disp: , Rfl:    Past Medical History:  Diagnosis Date   COVID-19 virus infection 05/2020   Hematuria    History of chicken pox    Hypertension    Nephrolithiasis    Polycystic kidney disease    Right sided sciatica 11/13/2019     Assessment and Plan: 1. Lumbar spondylosis   2. Chronic bilateral low back pain with left-sided sciatica   3. Chronic, continuous use of opioids   4. Chronic pain syndrome   5. DDD (degenerative disc disease), lumbar   6. Right  sided sciatica   7. Polycystic kidney disease   8. Left sided sciatica   Based on our discussion today I think appropriate to go ahead and refill her medicines for the next 2 months.  No changes will be made in her pharmacologic regimen.  Continue stretching strengthening exercises with core training and ambulation.  Continue follow-up with her nephrologist for polycystic kidney disease management with return to clinic in 60-monthschedule.  No other changes are initiated today.  Follow Up Instructions:    I discussed the assessment and treatment plan with the patient. The patient was provided an opportunity to ask questions and all were answered. The patient agreed with the plan and demonstrated an understanding of the instructions.   The patient was advised to call back or seek an in-person evaluation if the symptoms worsen or if the condition fails to improve as anticipated.  I provided 30 minutes of non-face-to-face time during this encounter.   JMolli Barrows MD

## 2022-02-25 DIAGNOSIS — R809 Proteinuria, unspecified: Secondary | ICD-10-CM | POA: Diagnosis not present

## 2022-02-25 DIAGNOSIS — N1831 Chronic kidney disease, stage 3a: Secondary | ICD-10-CM | POA: Diagnosis not present

## 2022-02-25 DIAGNOSIS — Q612 Polycystic kidney, adult type: Secondary | ICD-10-CM | POA: Diagnosis not present

## 2022-02-25 DIAGNOSIS — I10 Essential (primary) hypertension: Secondary | ICD-10-CM | POA: Diagnosis not present

## 2022-03-01 ENCOUNTER — Telehealth: Payer: Medicare Other | Admitting: Physician Assistant

## 2022-03-01 DIAGNOSIS — B9689 Other specified bacterial agents as the cause of diseases classified elsewhere: Secondary | ICD-10-CM

## 2022-03-01 DIAGNOSIS — H109 Unspecified conjunctivitis: Secondary | ICD-10-CM | POA: Diagnosis not present

## 2022-03-01 DIAGNOSIS — J069 Acute upper respiratory infection, unspecified: Secondary | ICD-10-CM | POA: Diagnosis not present

## 2022-03-01 MED ORDER — POLYMYXIN B-TRIMETHOPRIM 10000-0.1 UNIT/ML-% OP SOLN: 1.0000 [drp] | OPHTHALMIC | 0 refills | Status: AC

## 2022-03-01 MED ORDER — AMOXICILLIN-POT CLAVULANATE 875-125 MG PO TABS: 1.0000 | ORAL_TABLET | Freq: Two times a day (BID) | ORAL | 0 refills | Status: DC

## 2022-03-01 NOTE — Patient Instructions (Signed)
[name], thank you for joining Mar Daring, PA-C for today's virtual visit.  While this provider is not your primary care provider (PCP), if your PCP is located in our provider database this encounter information will be shared with them immediately following your visit.   [facility] account gives you access to today's visit and all your visits, tests, and labs performed at [facility] " click here if you don't have a [facility] account or go to [email or web address]  Consent: (Patient) Megan Brock provided verbal consent for this virtual visit at the beginning of the encounter.  Current Medications:  Current Outpatient Medications:    amLODipine (NORVASC) 10 MG tablet, , Disp: , Rfl:    amoxicillin-clavulanate (AUGMENTIN) 875-125 MG tablet, Take 1 tablet by mouth 2 (two) times daily., Disp: 14 tablet, Rfl: 0   cyclobenzaprine (FLEXERIL) 10 MG tablet, TAKE 1 TABLET(10 MG) BY MOUTH TWICE DAILY, Disp: 60 tablet, Rfl: 3   hydrochlorothiazide (HYDRODIURIL) 25 MG tablet, Take 25 mg by mouth daily., Disp: , Rfl:    HYDROcodone-acetaminophen (NORCO) 10-325 MG tablet, Take 1 tablet by mouth every 6 (six) hours as needed for moderate pain or severe pain., Disp: 45 tablet, Rfl: 0   [START ON 03/22/2022] HYDROcodone-acetaminophen (NORCO) 10-325 MG tablet, Take 1 tablet by mouth every 6 (six) hours as needed for moderate pain or severe pain., Disp: 45 tablet, Rfl: 0   lisinopril (ZESTRIL) 10 MG tablet, TAKE 1 TABLET(10 MG) BY MOUTH DAILY, Disp: 14 tablet, Rfl: 0   morphine (MS CONTIN) 60 MG 12 hr tablet, Take 1 tablet (60 mg total) by mouth 3 (three) times daily., Disp: 75 tablet, Rfl: 0   morphine (MS CONTIN) 60 MG 12 hr tablet, Take 1 tablet (60 mg total) by mouth 3 (three) times daily., Disp: 75 tablet, Rfl: 0   morphine (MS CONTIN) 60 MG 12 hr tablet, Take 1 tablet (60 mg total) by mouth 3 (three) times daily., Disp: 75 tablet, Rfl: 0   [START ON  03/22/2022] morphine (MS CONTIN) 60 MG 12 hr tablet, Take 1 tablet (60 mg total) by mouth 3 (three) times daily., Disp: 75 tablet, Rfl: 0   naloxone (NARCAN) nasal spray 4 mg/0.1 mL, For excess sedation from opioids., Disp: 1 kit, Rfl: 2   Tolvaptan 90 & 30 MG TBPK, Take 1 tablet by mouth 2 (two) times daily. Takes 60 mg in the AM and 30 mg 8 hours later., Disp: , Rfl:    trimethoprim-polymyxin b (POLYTRIM) ophthalmic solution, Place 1 drop into the left eye every 4 (four) hours for 5 days., Disp: 10 mL, Rfl: 0   Medications ordered in this encounter:  Meds ordered this encounter  Medications   trimethoprim-polymyxin b (POLYTRIM) ophthalmic solution    Sig: Place 1 drop into the left eye every 4 (four) hours for 5 days.    Dispense:  10 mL    Refill:  0    Order Specific Question:   Supervising Provider    Answer:   Chase Picket [id number]   amoxicillin-clavulanate (AUGMENTIN) 875-125 MG tablet    Sig: Take 1 tablet by mouth 2 (two) times daily.    Dispense:  14 tablet    Refill:  0    Order Specific Question:   Supervising Provider    Answer:   Chase Picket [id number]     *If you need refills on other medications prior to your next appointment, please contact your pharmacy*  Follow-Up: Call back or seek an in-person evaluation if the symptoms worsen or if the condition fails to improve as anticipated.  [place] [phone]  Other Instructions  Bacterial Conjunctivitis, Adult Bacterial conjunctivitis is an infection of the clear membrane that covers the white part of the eye and the inner surface of the eyelid (conjunctiva). When the blood vessels in the conjunctiva become inflamed, the eye becomes red or pink. The eye often feels irritated or itchy. Bacterial conjunctivitis spreads easily from person to person (is contagious). It also spreads easily from one eye to the other eye. What are the causes? This condition is caused by bacteria. You may get the  infection if you come into close contact with: A person who is infected with the bacteria. Items that are contaminated with the bacteria, such as a face towel, contact lens solution, or eye makeup. What increases the risk? You are more likely to develop this condition if: You are exposed to other people who have the infection. You wear contact lenses. You have a sinus infection. You have had a recent eye injury or surgery. You have a weak body defense system (immune system). You have a medical condition that causes dry eyes. What are the signs or symptoms? Symptoms of this condition include: Thick, yellowish discharge from the eye. This may turn into a crust on the eyelid overnight and cause your eyelids to stick together. Tearing or watery eyes. Itchy eyes. Burning feeling in your eyes. Eye redness. Swollen eyelids. Blurred vision. How is this diagnosed? This condition is diagnosed based on your symptoms and medical history. Your health care provider may also take a sample of discharge from your eye to find the cause of your infection. How is this treated? This condition may be treated with: Antibiotic eye drops or ointment to clear the infection more quickly and prevent the spread of infection to others. Antibiotic medicines taken by mouth (orally) to treat infections that do not respond to drops or ointments or that last longer than 10 days. Cool, wet cloths (cool compresses) placed on the eyes. Artificial tears applied 2-6 times a day. Follow these instructions at home: Medicines Take or apply your antibiotic medicine as told by your health care provider. Do not stop using the antibiotic, even if your condition improves, unless directed by your health care provider. Take or apply over-the-counter and prescription medicines only as told by your health care provider. Be very careful to avoid touching the edge of your eyelid with the eye-drop bottle or the ointment tube when you  apply medicines to the affected eye. This will keep you from spreading the infection to your other eye or to other people. Managing discomfort Gently wipe away any drainage from your eye with a warm, wet washcloth or a cotton ball. Apply a clean, cool compress to your eye for 10-20 minutes, 3-4 times a day. General instructions Do not wear contact lenses until the inflammation is gone and your health care provider says it is safe to wear them again. Ask your health care provider how to sterilize or replace your contact lenses before you use them again. Wear glasses until you can resume wearing contact lenses. Avoid wearing eye makeup until the inflammation is gone. Throw away any old eye cosmetics that may be contaminated. Change or wash your pillowcase every day. Do not share towels or washcloths. This may spread the infection. Wash your hands often with soap and water for at least 20 seconds and  especially before touching your face or eyes. Use paper towels to dry your hands. Avoid touching or rubbing your eyes. Do not drive or use heavy machinery if your vision is blurred. Contact a health care provider if: You have a fever. Your symptoms do not get better after 10 days. Get help right away if: You have a fever and your symptoms suddenly get worse. You have severe pain when you move your eye. You have facial pain, redness, or swelling. You have a sudden loss of vision. Summary Bacterial conjunctivitis is an infection of the clear membrane that covers the white part of the eye and the inner surface of the eyelid (conjunctiva). Bacterial conjunctivitis spreads easily from eye to eye and from person to person (is contagious). Wash your hands often with soap and water for at least 20 seconds and especially before touching your face or eyes. Use paper towels to dry your hands. Take or apply your antibiotic medicine as told by your health care provider. Do not stop using the antibiotic even if  your condition improves. Contact a health care provider if you have a fever or if your symptoms do not get better after 10 days. Get help right away if you have a sudden loss of vision. This information is not intended to replace advice given to you by your health care provider. Make sure you discuss any questions you have with your health care provider. Document Revised: 07/16/2020 Document Reviewed: 07/16/2020 Elsevier Patient Education  [employer].    If you have been instructed to have an in-person evaluation today at a local Urgent Care facility, please use the link below. It will take you to a list of all of our available [facility], including address, phone number and hours of operation. Please do not delay care.  [facility]  If you or a family member do not have a primary care provider, use the link below to schedule a visit and establish care. When you choose a [facility] primary care physician or advanced practice provider, you gain a long-term partner in health. Find a Primary Care Provider  Learn more about 's in-office and virtual care options: [facility]

## 2022-03-01 NOTE — Progress Notes (Signed)
Virtual Visit Consent   Megan Brock, you are scheduled for a virtual visit with a [facility] provider today. Just as with appointments in the office, your consent must be obtained to participate. Your consent will be active for this visit and any virtual visit you may have with one of our providers in the next 365 days. If you have a MyChart account, a copy of this consent can be sent to you electronically.  As this is a virtual visit, video technology does not allow for your provider to perform a traditional examination. This may limit your provider's ability to fully assess your condition. If your provider identifies any concerns that need to be evaluated in person or the need to arrange testing (such as labs, EKG, etc.), we will make arrangements to do so. Although advances in technology are sophisticated, we cannot ensure that it will always work on either your end or our end. If the connection with a video visit is poor, the visit may have to be switched to a telephone visit. With either a video or telephone visit, we are not always able to ensure that we have a secure connection.  By engaging in this virtual visit, you consent to the provision of healthcare and authorize for your insurance to be billed (if applicable) for the services provided during this visit. Depending on your insurance coverage, you may receive a charge related to this service.  I need to obtain your verbal consent now. Are you willing to proceed with your visit today? Megan Brock has provided verbal consent on 03/01/2022 for a virtual visit (video or telephone). Jennifer M Burnette, PA-C  Date: 03/01/2022 8:34 AM  Virtual Visit via Video Note   I, Jennifer M Burnette, connected with  Megan Brock  ([id number], 09/30/1980) on 03/01/22 at  8:15 AM EST by a video-enabled telemedicine application and verified that I am speaking with the correct person using two identifiers.  Location: Patient: Virtual Visit  Location Patient: Home Provider: Virtual Visit Location Provider: Home Office   I discussed the limitations of evaluation and management by telemedicine and the availability of in person appointments. The patient expressed understanding and agreed to proceed.    History of Present Illness: Megan Brock is a 41 y.o. who identifies as a female who was assigned female at birth, and is being seen today for URI symptoms and pink eye.  HPI: URI  This is a new problem. The current episode started 1 to 4 weeks ago. The problem has been gradually worsening. There has been no fever. Associated symptoms include congestion, coughing, headaches, rhinorrhea, sinus pain and a sore throat (now improved). Associated symptoms comments: Nasal congestion, post nasal drainage, fatigue. Treatments tried: mucinex sinus max, harris teeter brand daytime and nighttime. The treatment provided no relief.  Conjunctivitis  The current episode started today. The onset was sudden. The problem has been gradually worsening. The problem is mild. Nothing relieves the symptoms. Nothing aggravates the symptoms. Associated symptoms include decreased vision (blurred), congestion, headaches, rhinorrhea, sore throat (now improved), cough, URI, eye discharge and eye redness. Pertinent negatives include no fever, no double vision, no eye itching, no photophobia and no eye pain. The eye pain is mild. The left eye is affected. The eye pain is not associated with movement. The eyelid exhibits swelling.    At home Covid testing x 2  Problems:  Patient Active Problem List   Diagnosis Date Noted   COVID-19 virus infection 05/2020     Right sided sciatica 11/13/2019   Tobacco abuse 01/06/2017   Hypertension 12/08/2016   Long term current use of opiate analgesic 10/11/2016   Chronic pain syndrome 10/11/2016   Lumbar spondylosis 10/11/2016   Kidney stone 07/16/2015   Anxiety 07/16/2015   Hepatitis    Chronic lower back pain 09/18/2014    Chronic, continuous use of opioids 09/18/2014   Polycystic kidney, autosomal dominant 08/03/2012   Disorder of calcium metabolism 11/12/2011   Malaise and fatigue 11/03/2011   Gross hematuria 12/25/2007    Allergies: No Known Allergies Medications:  Current Outpatient Medications:    amLODipine (NORVASC) 10 MG tablet, , Disp: , Rfl:    amoxicillin-clavulanate (AUGMENTIN) 875-125 MG tablet, Take 1 tablet by mouth 2 (two) times daily., Disp: 14 tablet, Rfl: 0   cyclobenzaprine (FLEXERIL) 10 MG tablet, TAKE 1 TABLET(10 MG) BY MOUTH TWICE DAILY, Disp: 60 tablet, Rfl: 3   hydrochlorothiazide (HYDRODIURIL) 25 MG tablet, Take 25 mg by mouth daily., Disp: , Rfl:    HYDROcodone-acetaminophen (NORCO) 10-325 MG tablet, Take 1 tablet by mouth every 6 (six) hours as needed for moderate pain or severe pain., Disp: 45 tablet, Rfl: 0   [START ON 03/22/2022] HYDROcodone-acetaminophen (NORCO) 10-325 MG tablet, Take 1 tablet by mouth every 6 (six) hours as needed for moderate pain or severe pain., Disp: 45 tablet, Rfl: 0   lisinopril (ZESTRIL) 10 MG tablet, TAKE 1 TABLET(10 MG) BY MOUTH DAILY, Disp: 14 tablet, Rfl: 0   morphine (MS CONTIN) 60 MG 12 hr tablet, Take 1 tablet (60 mg total) by mouth 3 (three) times daily., Disp: 75 tablet, Rfl: 0   morphine (MS CONTIN) 60 MG 12 hr tablet, Take 1 tablet (60 mg total) by mouth 3 (three) times daily., Disp: 75 tablet, Rfl: 0   morphine (MS CONTIN) 60 MG 12 hr tablet, Take 1 tablet (60 mg total) by mouth 3 (three) times daily., Disp: 75 tablet, Rfl: 0   [START ON 03/22/2022] morphine (MS CONTIN) 60 MG 12 hr tablet, Take 1 tablet (60 mg total) by mouth 3 (three) times daily., Disp: 75 tablet, Rfl: 0   naloxone (NARCAN) nasal spray 4 mg/0.1 mL, For excess sedation from opioids., Disp: 1 kit, Rfl: 2   Tolvaptan 90 & 30 MG TBPK, Take 1 tablet by mouth 2 (two) times daily. Takes 60 mg in the AM and 30 mg 8 hours later., Disp: , Rfl:    trimethoprim-polymyxin b (POLYTRIM)  ophthalmic solution, Place 1 drop into the left eye every 4 (four) hours for 5 days., Disp: 10 mL, Rfl: 0  Observations/Objective: Patient is well-developed, well-nourished in no acute distress.  Resting comfortably at home.  Head is normocephalic, atraumatic.  No labored breathing.  Speech is clear and coherent with logical content.  Patient is alert and oriented at baseline.    Assessment and Plan: 1. Bacterial upper respiratory infection - amoxicillin-clavulanate (AUGMENTIN) 875-125 MG tablet; Take 1 tablet by mouth 2 (two) times daily.  Dispense: 14 tablet; Refill: 0  2. Bacterial conjunctivitis of left eye - trimethoprim-polymyxin b (POLYTRIM) ophthalmic solution; Place 1 drop into the left eye every 4 (four) hours for 5 days.  Dispense: 10 mL; Refill: 0  - Worsening symptoms that have not responded to OTC medications.  - Will give Augmentin - Continue allergy medications.  - Steam and humidifier can help - Stay well hydrated and get plenty of rest.   - Suspect bacterial conjunctivitis - Polytrim prescribed - Warm compresses - Good hand hygiene  -   Seek in person evaluation if symptoms worsen or fail to improve   Follow Up Instructions: I discussed the assessment and treatment plan with the patient. The patient was provided an opportunity to ask questions and all were answered. The patient agreed with the plan and demonstrated an understanding of the instructions.  A copy of instructions were sent to the patient via MyChart unless otherwise noted below.    The patient was advised to call back or seek an in-person evaluation if the symptoms worsen or if the condition fails to improve as anticipated.  Time:  I spent 12 minutes with the patient via telehealth technology discussing the above problems/concerns.    Jennifer M Burnette, PA-C  

## 2022-04-07 ENCOUNTER — Other Ambulatory Visit: Payer: Self-pay | Admitting: Anesthesiology

## 2022-04-15 ENCOUNTER — Encounter: Payer: Self-pay | Admitting: Anesthesiology

## 2022-04-15 ENCOUNTER — Ambulatory Visit: Payer: Medicare Other | Attending: Anesthesiology | Admitting: Anesthesiology

## 2022-04-15 DIAGNOSIS — G894 Chronic pain syndrome: Secondary | ICD-10-CM

## 2022-04-15 DIAGNOSIS — M47816 Spondylosis without myelopathy or radiculopathy, lumbar region: Secondary | ICD-10-CM | POA: Diagnosis not present

## 2022-04-15 DIAGNOSIS — M5442 Lumbago with sciatica, left side: Secondary | ICD-10-CM

## 2022-04-15 DIAGNOSIS — G8929 Other chronic pain: Secondary | ICD-10-CM

## 2022-04-15 DIAGNOSIS — Q613 Polycystic kidney, unspecified: Secondary | ICD-10-CM

## 2022-04-15 DIAGNOSIS — M5136 Other intervertebral disc degeneration, lumbar region: Secondary | ICD-10-CM

## 2022-04-15 DIAGNOSIS — F1190 Opioid use, unspecified, uncomplicated: Secondary | ICD-10-CM

## 2022-04-15 DIAGNOSIS — M5431 Sciatica, right side: Secondary | ICD-10-CM

## 2022-04-15 MED ORDER — HYDROCODONE-ACETAMINOPHEN 10-325 MG PO TABS: 1.0000 | ORAL_TABLET | Freq: Four times a day (QID) | ORAL | 0 refills | Status: DC | PRN

## 2022-04-15 MED ORDER — HYDROCODONE-ACETAMINOPHEN 10-325 MG PO TABS: 1.0000 | ORAL_TABLET | Freq: Four times a day (QID) | ORAL | 0 refills | Status: AC | PRN

## 2022-04-15 MED ORDER — MORPHINE SULFATE ER 60 MG PO TBCR: 60.0000 mg | EXTENDED_RELEASE_TABLET | Freq: Three times a day (TID) | ORAL | 0 refills | Status: DC

## 2022-04-15 MED ORDER — CYCLOBENZAPRINE HCL 10 MG PO TABS: 10.0000 mg | ORAL_TABLET | Freq: Two times a day (BID) | ORAL | 5 refills | Status: AC | PRN

## 2022-04-16 ENCOUNTER — Encounter: Payer: Self-pay | Admitting: Anesthesiology

## 2022-04-16 NOTE — Progress Notes (Signed)
Virtual Visit via Telephone Note  I connected with Megan Brock on 04/16/22 at  1:00 PM EST by telephone and verified that I am speaking with the correct person using two identifiers.  Location: Patient: Home Provider: Pain control center   I discussed the limitations, risks, security and privacy concerns of performing an evaluation and management service by telephone and the availability of in person appointments. I also discussed with the patient that there may be a patient responsible charge related to this service. The patient expressed understanding and agreed to proceed.   History of Present Illness: I spoke with Megan Brock today via telephone.  We are unable to do the video portion of the conference but she reports that she has been doing well regarding her pain management.  She is keeping her pain under good control with current regimen we have been using.  She has been on this chronically and having good success.  The quality of the pain in the low back and legs is similar to what she is experienced in the past.  She has been having some sciatica worse on the right side.  No recent change in strength or bowel or bladder function is noted.  She is following with her nephrologist who are following her GFR to assess for ongoing kidney damage secondary to her polycystic kidney disease.  She is seeing a diminishment in function.  She mention she may be ultimately required to consider kidney transplant.  Review of systems: General: No fevers or chills Pulmonary: No shortness of breath or dyspnea Cardiac: No angina or palpitations or lightheadedness GI: No abdominal pain or constipation Psych: No depression    Observations/Objective:  Current Outpatient Medications:    [START ON 05/21/2022] HYDROcodone-acetaminophen (NORCO) 10-325 MG tablet, Take 1 tablet by mouth every 6 (six) hours as needed for moderate pain or severe pain., Disp: 45 tablet, Rfl: 0   amLODipine (NORVASC) 10 MG  tablet, , Disp: , Rfl:    amoxicillin-clavulanate (AUGMENTIN) 875-125 MG tablet, Take 1 tablet by mouth 2 (two) times daily., Disp: 14 tablet, Rfl: 0   cyclobenzaprine (FLEXERIL) 10 MG tablet, Take 1 tablet (10 mg total) by mouth 2 (two) times daily as needed for muscle spasms. TAKE 1 TABLET(10 MG) BY MOUTH TWICE DAILY, Disp: 60 tablet, Rfl: 5   hydrochlorothiazide (HYDRODIURIL) 25 MG tablet, Take 25 mg by mouth daily., Disp: , Rfl:    [START ON 04/21/2022] HYDROcodone-acetaminophen (NORCO) 10-325 MG tablet, Take 1 tablet by mouth every 6 (six) hours as needed for moderate pain or severe pain., Disp: 45 tablet, Rfl: 0   lisinopril (ZESTRIL) 10 MG tablet, TAKE 1 TABLET(10 MG) BY MOUTH DAILY, Disp: 14 tablet, Rfl: 0   morphine (MS CONTIN) 60 MG 12 hr tablet, Take 1 tablet (60 mg total) by mouth 3 (three) times daily., Disp: 75 tablet, Rfl: 0   morphine (MS CONTIN) 60 MG 12 hr tablet, Take 1 tablet (60 mg total) by mouth 3 (three) times daily., Disp: 75 tablet, Rfl: 0   [START ON 04/21/2022] morphine (MS CONTIN) 60 MG 12 hr tablet, Take 1 tablet (60 mg total) by mouth 3 (three) times daily., Disp: 75 tablet, Rfl: 0   [START ON 05/21/2022] morphine (MS CONTIN) 60 MG 12 hr tablet, Take 1 tablet (60 mg total) by mouth 3 (three) times daily., Disp: 75 tablet, Rfl: 0   naloxone (NARCAN) nasal spray 4 mg/0.1 mL, For excess sedation from opioids., Disp: 1 kit, Rfl: 2   Tolvaptan  90 & 30 MG TBPK, Take 1 tablet by mouth 2 (two) times daily. Takes 60 mg in the AM and 30 mg 8 hours later., Disp: , Rfl:    Past Medical History:  Diagnosis Date   COVID-19 virus infection 05/2020   Hematuria    History of chicken pox    Hypertension    Nephrolithiasis    Polycystic kidney disease    Right sided sciatica 11/13/2019     Assessment and Plan: 1. Lumbar spondylosis   2. Chronic bilateral low back pain with left-sided sciatica   3. Chronic, continuous use of opioids   4. Chronic pain syndrome   5. DDD (degenerative  disc disease), lumbar   6. Polycystic kidney disease   7. Right sided sciatica   Based on our conversation today I think it is appropriate to refill her medicines for the next 2 months and this will be completed today.  I have reviewed the [facility] practitioner database information and it is appropriate.  She continues to derive good functional lifestyle improvement with the medicines and no untoward side effects.  Continue efforts at weight loss stretching strengthening.  At this point she is doing well with the sciatica management with no need for a repeat epidural indicated at this time.  Continue follow-up with her nephrologist and primary care physicians for baseline medical care.  Follow Up Instructions:    I discussed the assessment and treatment plan with the patient. The patient was provided an opportunity to ask questions and all were answered. The patient agreed with the plan and demonstrated an understanding of the instructions.   The patient was advised to call back or seek an in-person evaluation if the symptoms worsen or if the condition fails to improve as anticipated.  I provided 30 minutes of non-face-to-face time during this encounter.   Molli Barrows, MD

## 2022-05-13 DIAGNOSIS — N1831 Chronic kidney disease, stage 3a: Secondary | ICD-10-CM | POA: Diagnosis not present

## 2022-05-13 DIAGNOSIS — Q612 Polycystic kidney, adult type: Secondary | ICD-10-CM | POA: Diagnosis not present

## 2022-05-21 ENCOUNTER — Telehealth: Payer: Medicare Other | Admitting: Family Medicine

## 2022-05-21 DIAGNOSIS — B9689 Other specified bacterial agents as the cause of diseases classified elsewhere: Secondary | ICD-10-CM | POA: Diagnosis not present

## 2022-05-21 DIAGNOSIS — J208 Acute bronchitis due to other specified organisms: Secondary | ICD-10-CM | POA: Diagnosis not present

## 2022-05-21 MED ORDER — DOXYCYCLINE HYCLATE 100 MG PO TABS: 100.0000 mg | ORAL_TABLET | Freq: Two times a day (BID) | ORAL | 0 refills | Status: AC

## 2022-05-21 MED ORDER — PSEUDOEPH-BROMPHEN-DM 30-2-10 MG/5ML PO SYRP: 5.0000 mL | ORAL_SOLUTION | Freq: Four times a day (QID) | ORAL | 0 refills | Status: DC | PRN

## 2022-05-21 NOTE — Progress Notes (Signed)
Virtual Visit Consent   Megan Brock, you are scheduled for a virtual visit with a [facility] provider today. Just as with appointments in the office, your consent must be obtained to participate. Your consent will be active for this visit and any virtual visit you may have with one of our providers in the next 365 days. If you have a MyChart account, a copy of this consent can be sent to you electronically.  As this is a virtual visit, video technology does not allow for your provider to perform a traditional examination. This may limit your provider's ability to fully assess your condition. If your provider identifies any concerns that need to be evaluated in person or the need to arrange testing (such as labs, EKG, etc.), we will make arrangements to do so. Although advances in technology are sophisticated, we cannot ensure that it will always work on either your end or our end. If the connection with a video visit is poor, the visit may have to be switched to a telephone visit. With either a video or telephone visit, we are not always able to ensure that we have a secure connection.  By engaging in this virtual visit, you consent to the provision of healthcare and authorize for your insurance to be billed (if applicable) for the services provided during this visit. Depending on your insurance coverage, you may receive a charge related to this service.  I need to obtain your verbal consent now. Are you willing to proceed with your visit today? Megan Brock has provided verbal consent on 05/21/2022 for a virtual visit (video or telephone). Perlie Mayo, NP  Date: 05/21/2022 2:11 PM  Virtual Visit via Video Note   I, Perlie Mayo, connected with  Megan Brock  ([id number], Aug 17, 1980) on 05/21/22 at  2:15 PM EST by a video-enabled telemedicine application and verified that I am speaking with the correct person using two identifiers.  Location: Patient: Virtual Visit Location Patient:  Home Provider: Virtual Visit Location Provider: Home Office   I discussed the limitations of evaluation and management by telemedicine and the availability of in person appointments. The patient expressed understanding and agreed to proceed.    History of Present Illness: Megan Brock is a 42 y.o. who identifies as a female who was assigned female at birth, and is being seen today for coughing onset was Friday 7 days ago-developed congestion fever 101-102 max, chills, headaches, body aches, coughing up a neon green mucus and having trouble with sleeping. COVID negative. Current fever is still 100.4-100.6 range with fatigue. Denies chest pain, shortness of breath, sore throat or ear pain.   Problems:  Patient Active Problem List   Diagnosis Date Noted   COVID-19 virus infection 05/2020   Right sided sciatica 11/13/2019   Tobacco abuse 01/06/2017   Hypertension 12/08/2016   Long term current use of opiate analgesic 10/11/2016   Chronic pain syndrome 10/11/2016   Lumbar spondylosis 10/11/2016   Kidney stone 07/16/2015   Anxiety 07/16/2015   Hepatitis    Chronic lower back pain 09/18/2014   Chronic, continuous use of opioids 09/18/2014   Polycystic kidney, autosomal dominant 08/03/2012   Disorder of calcium metabolism 11/12/2011   Malaise and fatigue 11/03/2011   Gross hematuria 12/25/2007    Allergies: No Known Allergies Medications:  Current Outpatient Medications:    amLODipine (NORVASC) 10 MG tablet, , Disp: , Rfl:    amoxicillin-clavulanate (AUGMENTIN) 875-125 MG tablet, Take 1 tablet by mouth  2 (two) times daily., Disp: 14 tablet, Rfl: 0   cyclobenzaprine (FLEXERIL) 10 MG tablet, TAKE 1 TABLET(10 MG) BY MOUTH TWICE DAILY, Disp: 60 tablet, Rfl: 3   cyclobenzaprine (FLEXERIL) 10 MG tablet, Take 1 tablet (10 mg total) by mouth 2 (two) times daily as needed for muscle spasms. TAKE 1 TABLET(10 MG) BY MOUTH TWICE DAILY, Disp: 60 tablet, Rfl: 5   hydrochlorothiazide (HYDRODIURIL)  25 MG tablet, Take 25 mg by mouth daily., Disp: , Rfl:    HYDROcodone-acetaminophen (NORCO) 10-325 MG tablet, Take 1 tablet by mouth every 6 (six) hours as needed for moderate pain or severe pain., Disp: 45 tablet, Rfl: 0   HYDROcodone-acetaminophen (NORCO) 10-325 MG tablet, Take 1 tablet by mouth every 6 (six) hours as needed for moderate pain or severe pain., Disp: 45 tablet, Rfl: 0   lisinopril (ZESTRIL) 10 MG tablet, TAKE 1 TABLET(10 MG) BY MOUTH DAILY, Disp: 14 tablet, Rfl: 0   morphine (MS CONTIN) 60 MG 12 hr tablet, Take 1 tablet (60 mg total) by mouth 3 (three) times daily., Disp: 75 tablet, Rfl: 0   morphine (MS CONTIN) 60 MG 12 hr tablet, Take 1 tablet (60 mg total) by mouth 3 (three) times daily., Disp: 75 tablet, Rfl: 0   morphine (MS CONTIN) 60 MG 12 hr tablet, Take 1 tablet (60 mg total) by mouth 3 (three) times daily., Disp: 75 tablet, Rfl: 0   morphine (MS CONTIN) 60 MG 12 hr tablet, Take 1 tablet (60 mg total) by mouth 3 (three) times daily., Disp: 75 tablet, Rfl: 0   naloxone (NARCAN) nasal spray 4 mg/0.1 mL, For excess sedation from opioids., Disp: 1 kit, Rfl: 2   Tolvaptan 90 & 30 MG TBPK, Take 1 tablet by mouth 2 (two) times daily. Takes 60 mg in the AM and 30 mg 8 hours later., Disp: , Rfl:   Observations/Objective: Patient is well-developed, well-nourished in no acute distress.  Resting comfortably  at home.  Head is normocephalic, atraumatic.  No labored breathing.  Speech is clear and coherent with logical content.  Patient is alert and oriented at baseline.  Cough  Assessment and Plan:  1. Acute bacterial bronchitis  - doxycycline (VIBRA-TABS) 100 MG tablet; Take 1 tablet (100 mg total) by mouth 2 (two) times daily for 10 days.  Dispense: 20 tablet; Refill: 0 - brompheniramine-pseudoephedrine-DM 30-2-10 MG/5ML syrup; Take 5 mLs by mouth 4 (four) times daily as needed.  Dispense: 120 mL; Refill: 0   - Take meds as prescribed - Rest voice - Use a cool mist  humidifier especially during the winter months when heat dries out the air. - Use saline nose sprays frequently to help soothe nasal passages if they are drying out. - Stay hydrated by drinking plenty of fluids - Keep thermostat turn down low to prevent drying out which can cause a dry cough. - For any cough or congestion- robitussin DM or Delsym as needed - For fever or aches or pains- take tylenol or ibuprofen as directed on bottle             * for fevers greater than 101 orally you may alternate ibuprofen and tylenol every 3 hours.  If you do not improve you will need a follow up visit in person.               Reviewed side effects, risks and benefits of medication.   Patient acknowledged agreement and understanding of the plan.  Past Medical, Surgical, Social History,  Allergies, and Medications have been Reviewed.    Follow Up Instructions: I discussed the assessment and treatment plan with the patient. The patient was provided an opportunity to ask questions and all were answered. The patient agreed with the plan and demonstrated an understanding of the instructions.  A copy of instructions were sent to the patient via MyChart unless otherwise noted below.     The patient was advised to call back or seek an in-person evaluation if the symptoms worsen or if the condition fails to improve as anticipated.  Time:  I spent 7 minutes with the patient via telehealth technology discussing the above problems/concerns.    Perlie Mayo, NP

## 2022-05-21 NOTE — Patient Instructions (Addendum)
[name], thank you for joining Perlie Mayo, NP for today's virtual visit.  While this provider is not your primary care provider (PCP), if your PCP is located in our provider database this encounter information will be shared with them immediately following your visit.   [facility] account gives you access to today's visit and all your visits, tests, and labs performed at [facility] " click here if you don't have a [facility] account or go to [email or web address]  Consent: (Patient) Megan Brock provided verbal consent for this virtual visit at the beginning of the encounter.  Current Medications:  Current Outpatient Medications:    amLODipine (NORVASC) 10 MG tablet, , Disp: , Rfl:    brompheniramine-pseudoephedrine-DM 30-2-10 MG/5ML syrup, Take 5 mLs by mouth 4 (four) times daily as needed., Disp: 120 mL, Rfl: 0   cyclobenzaprine (FLEXERIL) 10 MG tablet, TAKE 1 TABLET(10 MG) BY MOUTH TWICE DAILY, Disp: 60 tablet, Rfl: 3   cyclobenzaprine (FLEXERIL) 10 MG tablet, Take 1 tablet (10 mg total) by mouth 2 (two) times daily as needed for muscle spasms. TAKE 1 TABLET(10 MG) BY MOUTH TWICE DAILY, Disp: 60 tablet, Rfl: 5   doxycycline (VIBRA-TABS) 100 MG tablet, Take 1 tablet (100 mg total) by mouth 2 (two) times daily for 10 days., Disp: 20 tablet, Rfl: 0   hydrochlorothiazide (HYDRODIURIL) 25 MG tablet, Take 25 mg by mouth daily., Disp: , Rfl:    HYDROcodone-acetaminophen (NORCO) 10-325 MG tablet, Take 1 tablet by mouth every 6 (six) hours as needed for moderate pain or severe pain., Disp: 45 tablet, Rfl: 0   HYDROcodone-acetaminophen (NORCO) 10-325 MG tablet, Take 1 tablet by mouth every 6 (six) hours as needed for moderate pain or severe pain., Disp: 45 tablet, Rfl: 0   lisinopril (ZESTRIL) 10 MG tablet, TAKE 1 TABLET(10 MG) BY MOUTH DAILY, Disp: 14 tablet, Rfl: 0   morphine (MS CONTIN) 60 MG 12 hr tablet, Take 1 tablet (60 mg total) by mouth 3  (three) times daily., Disp: 75 tablet, Rfl: 0   morphine (MS CONTIN) 60 MG 12 hr tablet, Take 1 tablet (60 mg total) by mouth 3 (three) times daily., Disp: 75 tablet, Rfl: 0   morphine (MS CONTIN) 60 MG 12 hr tablet, Take 1 tablet (60 mg total) by mouth 3 (three) times daily., Disp: 75 tablet, Rfl: 0   morphine (MS CONTIN) 60 MG 12 hr tablet, Take 1 tablet (60 mg total) by mouth 3 (three) times daily., Disp: 75 tablet, Rfl: 0   naloxone (NARCAN) nasal spray 4 mg/0.1 mL, For excess sedation from opioids., Disp: 1 kit, Rfl: 2   Tolvaptan 90 & 30 MG TBPK, Take 1 tablet by mouth 2 (two) times daily. Takes 60 mg in the AM and 30 mg 8 hours later., Disp: , Rfl:    Medications ordered in this encounter:  Meds ordered this encounter  Medications   doxycycline (VIBRA-TABS) 100 MG tablet    Sig: Take 1 tablet (100 mg total) by mouth 2 (two) times daily for 10 days.    Dispense:  20 tablet    Refill:  0    Order Specific Question:   Supervising Provider    Answer:   Chase Picket [id number]   brompheniramine-pseudoephedrine-DM 30-2-10 MG/5ML syrup    Sig: Take 5 mLs by mouth 4 (four) times daily as needed.    Dispense:  120 mL    Refill:  0    Order Specific Question:  Supervising Provider    Answer:   Chase Picket [[id number]]     *If you need refills on other medications prior to your next appointment, please contact your pharmacy*  Follow-Up: Call back or seek an in-person evaluation if the symptoms worsen or if the condition fails to improve as anticipated.  [place] [phone]   Other Instructions  - Take meds as prescribed - Rest voice - Use a cool mist humidifier especially during the winter months when heat dries out the air. - Use saline nose sprays frequently to help soothe nasal passages if they are drying out. - Stay hydrated by drinking plenty of fluids -Use Mucinex Plain - Keep thermostat turn down low to prevent drying out which can cause a dry  cough. - For any cough or congestion- robitussin DM or Delsym as needed - For fever or aches or pains- take tylenol or ibuprofen as directed on bottle             * for fevers greater than 101 orally you may alternate ibuprofen and tylenol every 3 hours.  If you do not improve you will need a follow up visit in person.                  If you have been instructed to have an in-person evaluation today at a local Urgent Care facility, please use the link below. It will take you to a list of all of our available [facility], including address, phone number and hours of operation. Please do not delay care.  [facility]  If you or a family member do not have a primary care provider, use the link below to schedule a visit and establish care. When you choose a [facility] primary care physician or advanced practice provider, you gain a long-term partner in health. Find a Primary Care Provider  Learn more about 's in-office and virtual care options: [facility]

## 2022-05-22 LAB — TOXASSURE SELECT 13 (MW), URINE

## 2022-05-24 DIAGNOSIS — N1831 Chronic kidney disease, stage 3a: Secondary | ICD-10-CM | POA: Diagnosis not present

## 2022-05-24 DIAGNOSIS — I10 Essential (primary) hypertension: Secondary | ICD-10-CM | POA: Diagnosis not present

## 2022-05-24 DIAGNOSIS — Q612 Polycystic kidney, adult type: Secondary | ICD-10-CM | POA: Diagnosis not present

## 2022-05-24 DIAGNOSIS — R809 Proteinuria, unspecified: Secondary | ICD-10-CM | POA: Diagnosis not present

## 2022-06-08 ENCOUNTER — Encounter: Payer: Self-pay | Admitting: Anesthesiology

## 2022-06-08 ENCOUNTER — Ambulatory Visit: Payer: Medicare Other | Attending: Anesthesiology | Admitting: Anesthesiology

## 2022-06-08 DIAGNOSIS — M5441 Lumbago with sciatica, right side: Secondary | ICD-10-CM | POA: Diagnosis not present

## 2022-06-08 DIAGNOSIS — F1190 Opioid use, unspecified, uncomplicated: Secondary | ICD-10-CM

## 2022-06-08 DIAGNOSIS — M47816 Spondylosis without myelopathy or radiculopathy, lumbar region: Secondary | ICD-10-CM | POA: Diagnosis not present

## 2022-06-08 DIAGNOSIS — M5431 Sciatica, right side: Secondary | ICD-10-CM

## 2022-06-08 DIAGNOSIS — M5442 Lumbago with sciatica, left side: Secondary | ICD-10-CM | POA: Diagnosis not present

## 2022-06-08 DIAGNOSIS — G8929 Other chronic pain: Secondary | ICD-10-CM

## 2022-06-08 DIAGNOSIS — Q613 Polycystic kidney, unspecified: Secondary | ICD-10-CM

## 2022-06-08 DIAGNOSIS — M5136 Other intervertebral disc degeneration, lumbar region: Secondary | ICD-10-CM

## 2022-06-08 DIAGNOSIS — G894 Chronic pain syndrome: Secondary | ICD-10-CM

## 2022-06-08 MED ORDER — MORPHINE SULFATE ER 60 MG PO TBCR: 60.0000 mg | EXTENDED_RELEASE_TABLET | Freq: Three times a day (TID) | ORAL | 0 refills | Status: DC

## 2022-06-08 MED ORDER — HYDROCODONE-ACETAMINOPHEN 10-325 MG PO TABS: 1.0000 | ORAL_TABLET | Freq: Four times a day (QID) | ORAL | 0 refills | Status: AC | PRN

## 2022-06-08 MED ORDER — HYDROCODONE-ACETAMINOPHEN 10-325 MG PO TABS: 1.0000 | ORAL_TABLET | Freq: Four times a day (QID) | ORAL | 0 refills | Status: DC | PRN

## 2022-06-08 NOTE — Progress Notes (Signed)
Virtual Visit via Telephone Note  I connected with Megan Brock on 06/08/22 at  8:35 AM EST by telephone and verified that I am speaking with the correct person using two identifiers.  Location: Patient: Home Provider: Pain control center   I discussed the limitations, risks, security and privacy concerns of performing an evaluation and management service by telephone and the availability of in person appointments. I also discussed with the patient that there may be a patient responsible charge related to this service. The patient expressed understanding and agreed to proceed.   History of Present Illness: I spoke to Megan Brock today via telephone as we are unable like for the video portion of the virtual conference.  She reports that she continues to do well following her most recent epidural back in August.  She had some recent right leg sciatica about 3 weeks ago following some prolonged standing at an event but this has subsided.  Her low back pain continues but is stable.  No change in quality characteristic or distribution is noted.  She still takes her medications including the MS Contin and hydrocodone as scheduled and these continue to work well her for her with no side effects reported.  The quality of the back pain and sciatica are comparable to what she is experienced chronically.  She continues to do her stretching strengthening exercises as tolerated.  She also reports that she is continuing to follow-up with Dr. Zollie Scale in regards to her polycystic kidney disease and her diminishing renal function has stabilized to some extent.   Observations/Objective:  Current Outpatient Medications:    [START ON 07/20/2022] HYDROcodone-acetaminophen (NORCO) 10-325 MG tablet, Take 1 tablet by mouth every 6 (six) hours as needed for moderate pain or severe pain., Disp: 45 tablet, Rfl: 0   amLODipine (NORVASC) 10 MG tablet, , Disp: , Rfl:    brompheniramine-pseudoephedrine-DM 30-2-10 MG/5ML syrup,  Take 5 mLs by mouth 4 (four) times daily as needed., Disp: 120 mL, Rfl: 0   cyclobenzaprine (FLEXERIL) 10 MG tablet, TAKE 1 TABLET(10 MG) BY MOUTH TWICE DAILY, Disp: 60 tablet, Rfl: 3   cyclobenzaprine (FLEXERIL) 10 MG tablet, Take 1 tablet (10 mg total) by mouth 2 (two) times daily as needed for muscle spasms. TAKE 1 TABLET(10 MG) BY MOUTH TWICE DAILY, Disp: 60 tablet, Rfl: 5   hydrochlorothiazide (HYDRODIURIL) 25 MG tablet, Take 25 mg by mouth daily., Disp: , Rfl:    [START ON 06/19/2022] HYDROcodone-acetaminophen (NORCO) 10-325 MG tablet, Take 1 tablet by mouth every 6 (six) hours as needed for moderate pain or severe pain., Disp: 45 tablet, Rfl: 0   lisinopril (ZESTRIL) 10 MG tablet, TAKE 1 TABLET(10 MG) BY MOUTH DAILY, Disp: 14 tablet, Rfl: 0   morphine (MS CONTIN) 60 MG 12 hr tablet, Take 1 tablet (60 mg total) by mouth 3 (three) times daily., Disp: 75 tablet, Rfl: 0   morphine (MS CONTIN) 60 MG 12 hr tablet, Take 1 tablet (60 mg total) by mouth 3 (three) times daily., Disp: 75 tablet, Rfl: 0   [START ON 06/19/2022] morphine (MS CONTIN) 60 MG 12 hr tablet, Take 1 tablet (60 mg total) by mouth 3 (three) times daily., Disp: 75 tablet, Rfl: 0   [START ON 07/20/2022] morphine (MS CONTIN) 60 MG 12 hr tablet, Take 1 tablet (60 mg total) by mouth 3 (three) times daily., Disp: 75 tablet, Rfl: 0   naloxone (NARCAN) nasal spray 4 mg/0.1 mL, For excess sedation from opioids., Disp: 1 kit, Rfl: 2  Tolvaptan 90 & 30 MG TBPK, Take 1 tablet by mouth 2 (two) times daily. Takes 60 mg in the AM and 30 mg 8 hours later., Disp: , Rfl:    Past Medical History:  Diagnosis Date   COVID-19 virus infection 05/2020   Hematuria    History of chicken pox    Hypertension    Nephrolithiasis    Polycystic kidney disease    Right sided sciatica 11/13/2019     Assessment and Plan:  1. Lumbar spondylosis   2. Chronic bilateral low back pain with left-sided sciatica   3. Chronic, continuous use of opioids   4. Chronic  pain syndrome   5. DDD (degenerative disc disease), lumbar   6. Polycystic kidney disease   7. Right sided sciatica   Based on our discussion today it is appropriate to refill her medicines for March 2 and April 2 with no other changes in her pharmacologic regimen initiated.  Continue her muscle relaxants as she is continue her stretching strengthening as she is we will defer on any repeat injections at this point.  I have reviewed the [facility] practitioner database information is appropriate.  Continue follow-up with Dr. Zollie Scale and her primary care physicians for baseline medical care. Follow Up Instructions:    I discussed the assessment and treatment plan with the patient. The patient was provided an opportunity to ask questions and all were answered. The patient agreed with the plan and demonstrated an understanding of the instructions.   The patient was advised to call back or seek an in-person evaluation if the symptoms worsen or if the condition fails to improve as anticipated.  I provided 30 minutes of non-face-to-face time during this encounter.   Molli Barrows, MD

## 2022-07-07 DIAGNOSIS — I671 Cerebral aneurysm, nonruptured: Secondary | ICD-10-CM | POA: Diagnosis not present

## 2022-08-02 ENCOUNTER — Ambulatory Visit: Payer: Medicare Other | Admitting: Anesthesiology

## 2022-08-03 ENCOUNTER — Encounter: Payer: Self-pay | Admitting: Anesthesiology

## 2022-08-03 ENCOUNTER — Ambulatory Visit: Payer: Medicare Other | Attending: Anesthesiology | Admitting: Anesthesiology

## 2022-08-03 DIAGNOSIS — F1190 Opioid use, unspecified, uncomplicated: Secondary | ICD-10-CM

## 2022-08-03 DIAGNOSIS — Q613 Polycystic kidney, unspecified: Secondary | ICD-10-CM

## 2022-08-03 DIAGNOSIS — M47816 Spondylosis without myelopathy or radiculopathy, lumbar region: Secondary | ICD-10-CM

## 2022-08-03 DIAGNOSIS — G894 Chronic pain syndrome: Secondary | ICD-10-CM | POA: Diagnosis not present

## 2022-08-03 DIAGNOSIS — M5442 Lumbago with sciatica, left side: Secondary | ICD-10-CM | POA: Diagnosis not present

## 2022-08-03 DIAGNOSIS — G8929 Other chronic pain: Secondary | ICD-10-CM

## 2022-08-03 DIAGNOSIS — M5431 Sciatica, right side: Secondary | ICD-10-CM

## 2022-08-03 DIAGNOSIS — M5136 Other intervertebral disc degeneration, lumbar region: Secondary | ICD-10-CM

## 2022-08-03 MED ORDER — HYDROCODONE-ACETAMINOPHEN 10-325 MG PO TABS: 1.0000 | ORAL_TABLET | Freq: Four times a day (QID) | ORAL | 0 refills | Status: AC | PRN

## 2022-08-03 MED ORDER — HYDROCODONE-ACETAMINOPHEN 10-325 MG PO TABS: 1.0000 | ORAL_TABLET | Freq: Four times a day (QID) | ORAL | 0 refills | Status: DC | PRN

## 2022-08-03 MED ORDER — MORPHINE SULFATE ER 60 MG PO TBCR: 60.0000 mg | EXTENDED_RELEASE_TABLET | Freq: Three times a day (TID) | ORAL | 0 refills | Status: DC

## 2022-08-03 NOTE — Progress Notes (Signed)
Virtual Visit via Telephone Note  I connected with [name] on 08/03/22 at  9:50 AM EDT by telephone and verified that I am speaking with the correct person using two identifiers.  Location: Patient: Home Provider: [facility] control [facility]   I discussed the limitations, risks, security and privacy concerns of performing an evaluation and management service by telephone and the availability of in person appointments. I also discussed with the patient that there may be a patient responsible charge related to this service. The patient expressed understanding and agreed to proceed.   History of Present Illness:  I spoke with Morrie Sheldon via telephone.  We could not like for the video portion conference.  She reports that she is doing well since her last visit.  She still having some back pain comparable to what she has had especially with her polycystic kidney disease and flank pain subsequent to that.  She is no longer having the sciatica symptoms as severely following her most recent epidural back in August of last year.  She is doing her stretching exercises as tolerated trying to stay active and working on core strength.  The quality characteristic and distribution of the low back pain is similar to what she is experienced in the past.  She is seeing her nephrologist to follow with the polycystic kidney disease.  She is not having problems with sciatica at this time though she does have some intermittent pain.  She is taking her medications as prescribed these continue to give her significant improvement though she feels that they are less effective from previously but she is trying to work on that.  She does not want to increase her dosing.  No side effects are reported she still gets about 60% occasionally 70% with the MS Contin and uses the hydrocodone for breakthrough pain.  Review of systems: General: No fevers or chills Pulmonary: No shortness of breath or dyspnea Cardiac: No angina or  palpitations or lightheadedness GI: No abdominal pain or constipation Psych: No depression  Observations/Objective:  Current Outpatient Medications:    [START ON 09/17/2022] HYDROcodone-acetaminophen (NORCO) 10-325 MG tablet, Take 1 tablet by mouth every 6 (six) hours as needed for moderate pain or severe pain., Disp: 45 tablet, Rfl: 0   amLODipine (NORVASC) 10 MG tablet, , Disp: , Rfl:    brompheniramine-pseudoephedrine-DM 30-2-10 MG/5ML syrup, Take 5 mLs by mouth 4 (four) times daily as needed., Disp: 120 mL, Rfl: 0   cyclobenzaprine (FLEXERIL) 10 MG tablet, TAKE 1 TABLET(10 MG) BY MOUTH TWICE DAILY, Disp: 60 tablet, Rfl: 3   cyclobenzaprine (FLEXERIL) 10 MG tablet, Take 1 tablet (10 mg total) by mouth 2 (two) times daily as needed for muscle spasms. TAKE 1 TABLET(10 MG) BY MOUTH TWICE DAILY, Disp: 60 tablet, Rfl: 5   hydrochlorothiazide (HYDRODIURIL) 25 MG tablet, Take 25 mg by mouth daily., Disp: , Rfl:    [START ON 08/18/2022] HYDROcodone-acetaminophen (NORCO) 10-325 MG tablet, Take 1 tablet by mouth every 6 (six) hours as needed for moderate pain or severe pain., Disp: 45 tablet, Rfl: 0   lisinopril (ZESTRIL) 10 MG tablet, TAKE 1 TABLET(10 MG) BY MOUTH DAILY, Disp: 14 tablet, Rfl: 0   morphine (MS CONTIN) 60 MG 12 hr tablet, Take 1 tablet (60 mg total) by mouth 3 (three) times daily., Disp: 75 tablet, Rfl: 0   morphine (MS CONTIN) 60 MG 12 hr tablet, Take 1 tablet (60 mg total) by mouth 3 (three) times daily., Disp: 75 tablet, Rfl: 0   [  START ON 08/18/2022] morphine (MS CONTIN) 60 MG 12 hr tablet, Take 1 tablet (60 mg total) by mouth 3 (three) times daily., Disp: 75 tablet, Rfl: 0   [START ON 09/17/2022] morphine (MS CONTIN) 60 MG 12 hr tablet, Take 1 tablet (60 mg total) by mouth 3 (three) times daily., Disp: 75 tablet, Rfl: 0   naloxone (NARCAN) nasal spray 4 mg/0.1 mL, For excess sedation from opioids., Disp: 1 kit, Rfl: 2   Tolvaptan 90 & 30 MG TBPK, Take 1 tablet by mouth 2 (two) times daily.  Takes 60 mg in the AM and 30 mg 8 hours later., Disp: , Rfl:    Past Medical History:  Diagnosis Date   COVID-19 virus infection 05/2020   Hematuria    History of chicken pox    Hypertension    Nephrolithiasis    Polycystic kidney disease    Right sided sciatica 11/13/2019     Assessment and Plan: 1. Lumbar spondylosis   2. Chronic bilateral low back pain with left-sided sciatica   3. Chronic, continuous use of opioids   4. Chronic pain syndrome   5. DDD (degenerative disc disease), lumbar   6. Polycystic kidney disease   7. Right sided sciatica   Based on our conversation today I think it is appropriate to refill her medicines for the next 2 months dated 4 May 1 and May 31.  No changes in her pharmacologic regimen will be initiated.  I encouraged her to continue stretching strengthening exercises.  She would be a candidate for repeat epidural if indicated but I will defer to her to let us know when this might be.  She has responded very favorably to epidurals with generally 80 to 100% relief lasting 3 to 6 months following each injection when she cannot get the sciatica symptoms controlled with conservative measures.  Continue follow-up with her nephrologist and primary care physician for baseline medical care with scheduled return to clinic in 2 months.  Follow Up Instructions:    I discussed the assessment and treatment plan with the patient. The patient was provided an opportunity to ask questions and all were answered. The patient agreed with the plan and demonstrated an understanding of the instructions.   The patient was advised to call back or seek an in-person evaluation if the symptoms worsen or if the condition fails to improve as anticipated.  I provided 30 minutes of non-face-to-face time during this encounter.   [name], MD

## 2022-08-16 DIAGNOSIS — N1831 Chronic kidney disease, stage 3a: Secondary | ICD-10-CM | POA: Diagnosis not present

## 2022-08-16 DIAGNOSIS — Q612 Polycystic kidney, adult type: Secondary | ICD-10-CM | POA: Diagnosis not present

## 2022-08-24 DIAGNOSIS — I10 Essential (primary) hypertension: Secondary | ICD-10-CM | POA: Diagnosis not present

## 2022-08-24 DIAGNOSIS — Q612 Polycystic kidney, adult type: Secondary | ICD-10-CM | POA: Diagnosis not present

## 2022-08-24 DIAGNOSIS — N1831 Chronic kidney disease, stage 3a: Secondary | ICD-10-CM | POA: Diagnosis not present

## 2022-08-24 DIAGNOSIS — R809 Proteinuria, unspecified: Secondary | ICD-10-CM | POA: Diagnosis not present

## 2022-10-12 ENCOUNTER — Ambulatory Visit: Payer: Medicare Other | Attending: Anesthesiology | Admitting: Anesthesiology

## 2022-10-12 ENCOUNTER — Encounter: Payer: Self-pay | Admitting: Anesthesiology

## 2022-10-12 DIAGNOSIS — G8929 Other chronic pain: Secondary | ICD-10-CM

## 2022-10-12 DIAGNOSIS — M5431 Sciatica, right side: Secondary | ICD-10-CM

## 2022-10-12 DIAGNOSIS — M5442 Lumbago with sciatica, left side: Secondary | ICD-10-CM | POA: Diagnosis not present

## 2022-10-12 DIAGNOSIS — G894 Chronic pain syndrome: Secondary | ICD-10-CM | POA: Diagnosis not present

## 2022-10-12 DIAGNOSIS — M47816 Spondylosis without myelopathy or radiculopathy, lumbar region: Secondary | ICD-10-CM | POA: Diagnosis not present

## 2022-10-12 DIAGNOSIS — F1190 Opioid use, unspecified, uncomplicated: Secondary | ICD-10-CM | POA: Diagnosis not present

## 2022-10-12 DIAGNOSIS — M5136 Other intervertebral disc degeneration, lumbar region: Secondary | ICD-10-CM

## 2022-10-12 DIAGNOSIS — Q613 Polycystic kidney, unspecified: Secondary | ICD-10-CM

## 2022-10-12 MED ORDER — MORPHINE SULFATE ER 60 MG PO TBCR: 60.0000 mg | EXTENDED_RELEASE_TABLET | Freq: Three times a day (TID) | ORAL | 0 refills | Status: DC

## 2022-10-12 MED ORDER — HYDROCODONE-ACETAMINOPHEN 10-325 MG PO TABS: 1.0000 | ORAL_TABLET | Freq: Four times a day (QID) | ORAL | 0 refills | Status: DC | PRN

## 2022-10-12 MED ORDER — HYDROCODONE-ACETAMINOPHEN 10-325 MG PO TABS: 1.0000 | ORAL_TABLET | Freq: Four times a day (QID) | ORAL | 0 refills | Status: AC | PRN

## 2022-10-12 NOTE — Progress Notes (Signed)
Virtual Visit via Telephone Note  I connected with [name] on 10/12/22 at  3:20 PM EDT by telephone and verified that I am speaking with the correct person using two identifiers.  Location: Patient: Home Provider: Pain control center   I discussed the limitations, risks, security and privacy concerns of performing an evaluation and management service by telephone and the availability of in person appointments. I also discussed with the patient that there may be a patient responsible charge related to this service. The patient expressed understanding and agreed to proceed.   History of Present Illness: I spoke with Megan Brock regarding her low back pain and flank pain.  We did this via telephone as we are unable lengthen the video portion.  She reports that she is having an increasing amount of left side flank pain secondary to worsening polycystic kidney disease.  She continues to follow with Dr. Lourdes Sledge her nephrologist.  She is continuing to have more swelling and edema in the kidney causing more pain.  She has also had some diminished renal function and will likely require a kidney transplant in the not-too-distant future as she reports today.  In regards to her sciatica this has been stable.  She is doing her stretching strengthening core exercises as tolerated and staying active and functional.  No change in the quality characteristic or distribution of her low back pain is noted at this time.  She still takes her MS Contin as prescribed 2-3 times a day and her hydrocodone for breakthrough pain.  She is noticing more breakthrough pain on the current MS Contin dose but does not want to change or increase her dosing.  She is getting adequate relief as she reports and is happy with that.  No side effects with the medications are noted.  Review of systems: General: No fevers or chills Pulmonary: No shortness of breath or dyspnea Cardiac: No angina or palpitations or lightheadedness GI: No  abdominal pain or constipation Psych: No depression    Observations/Objective:  Current Outpatient Medications:    [START ON 11/16/2022] HYDROcodone-acetaminophen (NORCO) 10-325 MG tablet, Take 1 tablet by mouth every 6 (six) hours as needed for moderate pain or severe pain., Disp: 45 tablet, Rfl: 0   amLODipine (NORVASC) 10 MG tablet, , Disp: , Rfl:    brompheniramine-pseudoephedrine-DM 30-2-10 MG/5ML syrup, Take 5 mLs by mouth 4 (four) times daily as needed., Disp: 120 mL, Rfl: 0   cyclobenzaprine (FLEXERIL) 10 MG tablet, TAKE 1 TABLET(10 MG) BY MOUTH TWICE DAILY, Disp: 60 tablet, Rfl: 3   cyclobenzaprine (FLEXERIL) 10 MG tablet, Take 1 tablet (10 mg total) by mouth 2 (two) times daily as needed for muscle spasms. TAKE 1 TABLET(10 MG) BY MOUTH TWICE DAILY, Disp: 60 tablet, Rfl: 5   hydrochlorothiazide (HYDRODIURIL) 25 MG tablet, Take 25 mg by mouth daily., Disp: , Rfl:    [START ON 10/14/2022] HYDROcodone-acetaminophen (NORCO) 10-325 MG tablet, Take 1 tablet by mouth every 6 (six) hours as needed for moderate pain or severe pain., Disp: 45 tablet, Rfl: 0   lisinopril (ZESTRIL) 10 MG tablet, TAKE 1 TABLET(10 MG) BY MOUTH DAILY, Disp: 14 tablet, Rfl: 0   morphine (MS CONTIN) 60 MG 12 hr tablet, Take 1 tablet (60 mg total) by mouth 3 (three) times daily., Disp: 75 tablet, Rfl: 0   morphine (MS CONTIN) 60 MG 12 hr tablet, Take 1 tablet (60 mg total) by mouth 3 (three) times daily., Disp: 75 tablet, Rfl: 0   [START ON  10/14/2022] morphine (MS CONTIN) 60 MG 12 hr tablet, Take 1 tablet (60 mg total) by mouth 3 (three) times daily., Disp: 75 tablet, Rfl: 0   [START ON 11/16/2022] morphine (MS CONTIN) 60 MG 12 hr tablet, Take 1 tablet (60 mg total) by mouth 3 (three) times daily., Disp: 75 tablet, Rfl: 0   naloxone (NARCAN) nasal spray 4 mg/0.1 mL, For excess sedation from opioids., Disp: 1 kit, Rfl: 2   Tolvaptan 90 & 30 MG TBPK, Take 1 tablet by mouth 2 (two) times daily. Takes 60 mg in the AM and 30 mg 8  hours later., Disp: , Rfl:    Past Medical History:  Diagnosis Date   COVID-19 virus infection 05/2020   Hematuria    History of chicken pox    Hypertension    Nephrolithiasis    Polycystic kidney disease    Right sided sciatica 11/13/2019     Assessment and Plan: 1. Lumbar spondylosis   2. Chronic, continuous use of opioids   3. Chronic pain syndrome   4. Chronic bilateral low back pain with left-sided sciatica   5. DDD (degenerative disc disease), lumbar   6. Polycystic kidney disease   7. Right sided sciatica   Based on our discussion today it is appropriate to refill her medications.  This will be due for June 30 however she is leaving town this coming Thursday so this prescription will be generated and released for June 27 with her next prescription for July 30.  No other changes in her pharmacologic regimen will be initiated.  I encouraged her to continue with stretching strengthening exercises reviewed today.  Continue follow-up with Dr. Lourdes Sledge for ongoing care and her primary care physicians for baseline medical care with return to clinic scheduled in 2 months.  Follow Up Instructions:    I discussed the assessment and treatment plan with the patient. The patient was provided an opportunity to ask questions and all were answered. The patient agreed with the plan and demonstrated an understanding of the instructions.   The patient was advised to call back or seek an in-person evaluation if the symptoms worsen or if the condition fails to improve as anticipated.  I provided 30 minutes of non-face-to-face time during this encounter.   [name], MD

## 2022-11-18 DIAGNOSIS — Q612 Polycystic kidney, adult type: Secondary | ICD-10-CM | POA: Diagnosis not present

## 2022-11-18 DIAGNOSIS — N1831 Chronic kidney disease, stage 3a: Secondary | ICD-10-CM | POA: Diagnosis not present

## 2022-11-23 DIAGNOSIS — R809 Proteinuria, unspecified: Secondary | ICD-10-CM | POA: Diagnosis not present

## 2022-11-23 DIAGNOSIS — N1831 Chronic kidney disease, stage 3a: Secondary | ICD-10-CM | POA: Diagnosis not present

## 2022-11-23 DIAGNOSIS — I10 Essential (primary) hypertension: Secondary | ICD-10-CM | POA: Diagnosis not present

## 2022-11-23 DIAGNOSIS — Q612 Polycystic kidney, adult type: Secondary | ICD-10-CM | POA: Diagnosis not present

## 2022-12-14 ENCOUNTER — Ambulatory Visit: Payer: Medicare Other | Admitting: Anesthesiology

## 2022-12-15 ENCOUNTER — Ambulatory Visit: Payer: Medicare Other | Attending: Anesthesiology | Admitting: Anesthesiology

## 2022-12-15 DIAGNOSIS — G894 Chronic pain syndrome: Secondary | ICD-10-CM

## 2022-12-15 DIAGNOSIS — M5442 Lumbago with sciatica, left side: Secondary | ICD-10-CM

## 2022-12-15 DIAGNOSIS — F1190 Opioid use, unspecified, uncomplicated: Secondary | ICD-10-CM

## 2022-12-15 DIAGNOSIS — Q613 Polycystic kidney, unspecified: Secondary | ICD-10-CM

## 2022-12-15 DIAGNOSIS — M5431 Sciatica, right side: Secondary | ICD-10-CM

## 2022-12-15 DIAGNOSIS — M5441 Lumbago with sciatica, right side: Secondary | ICD-10-CM

## 2022-12-15 DIAGNOSIS — M5136 Other intervertebral disc degeneration, lumbar region: Secondary | ICD-10-CM

## 2022-12-15 DIAGNOSIS — M47816 Spondylosis without myelopathy or radiculopathy, lumbar region: Secondary | ICD-10-CM | POA: Diagnosis not present

## 2022-12-15 DIAGNOSIS — G8929 Other chronic pain: Secondary | ICD-10-CM

## 2022-12-15 DIAGNOSIS — Z79891 Long term (current) use of opiate analgesic: Secondary | ICD-10-CM | POA: Diagnosis not present

## 2022-12-15 MED ORDER — MORPHINE SULFATE ER 60 MG PO TBCR: 60.0000 mg | EXTENDED_RELEASE_TABLET | Freq: Three times a day (TID) | ORAL | 0 refills | Status: DC

## 2022-12-15 MED ORDER — HYDROCODONE-ACETAMINOPHEN 10-325 MG PO TABS
1.0000 | ORAL_TABLET | Freq: Four times a day (QID) | ORAL | 0 refills | Status: DC | PRN
Start: 2023-01-14 — End: 2023-02-07

## 2022-12-15 MED ORDER — HYDROCODONE-ACETAMINOPHEN 10-325 MG PO TABS: 1.0000 | ORAL_TABLET | Freq: Four times a day (QID) | ORAL | 0 refills | Status: AC | PRN

## 2022-12-15 NOTE — Progress Notes (Signed)
Virtual Visit via Telephone Note  I connected with Megan Brock on 12/15/22 at  1:30 PM EDT by telephone and verified that I am speaking with the correct person using two identifiers.  Location: Patient: Home Provider: Pain control center   I discussed the limitations, risks, security and privacy concerns of performing an evaluation and management service by telephone and the availability of in person appointments. I also discussed with the patient that there may be a patient responsible charge related to this service. The patient expressed understanding and agreed to proceed.   History of Present Illness: I spoke with Megan Brock via telephone as we are unable lengthen the video portion the conference.  She reports that she is having a lot more breakthrough pain over the past 2 to 4 months.  She notes that she has been using her MS Contin 3 times a day more frequently and requiring that.  In the past she has been able to vacillate between 2 and 3 times on average but recently she has needed 3 times a day dosing.  She is using the hydrocodone for breakthrough pain and generally this works well when she is taking it 3 times a day on the MS Contin.  No side effects or sedation are reported with the medication.  The quality characteristic and distribution of this pain has been stable with no changes recently reported.  She is trying to stay active doing her stretching strengthening exercises as best possible.  The sciatica is present but generally controlled with the physical therapy and medication management.  Her flank pain continues with ongoing problems with polycystic kidney disease.  She is following with her nephrologist and her kidney function has been stable which she reports is good news.  Otherwise no change in lower EXTR strength function or bowel or bladder function is noted.  Review of systems: General: No fevers or chills Pulmonary: No shortness of breath or dyspnea Cardiac: No angina or  palpitations or lightheadedness GI: No abdominal pain or constipation Psych: No depression    Observations/Objective:  Current Outpatient Medications:    [START ON 01/14/2023] HYDROcodone-acetaminophen (NORCO) 10-325 MG tablet, Take 1 tablet by mouth every 6 (six) hours as needed for moderate pain or severe pain., Disp: 60 tablet, Rfl: 0   amLODipine (NORVASC) 10 MG tablet, , Disp: , Rfl:    brompheniramine-pseudoephedrine-DM 30-2-10 MG/5ML syrup, Take 5 mLs by mouth 4 (four) times daily as needed., Disp: 120 mL, Rfl: 0   cyclobenzaprine (FLEXERIL) 10 MG tablet, TAKE 1 TABLET(10 MG) BY MOUTH TWICE DAILY, Disp: 60 tablet, Rfl: 3   hydrochlorothiazide (HYDRODIURIL) 25 MG tablet, Take 25 mg by mouth daily., Disp: , Rfl:    HYDROcodone-acetaminophen (NORCO) 10-325 MG tablet, Take 1 tablet by mouth every 6 (six) hours as needed for moderate pain or severe pain., Disp: 60 tablet, Rfl: 0   lisinopril (ZESTRIL) 10 MG tablet, TAKE 1 TABLET(10 MG) BY MOUTH DAILY, Disp: 14 tablet, Rfl: 0   morphine (MS CONTIN) 60 MG 12 hr tablet, Take 1 tablet (60 mg total) by mouth 3 (three) times daily., Disp: 75 tablet, Rfl: 0   morphine (MS CONTIN) 60 MG 12 hr tablet, Take 1 tablet (60 mg total) by mouth 3 (three) times daily., Disp: 75 tablet, Rfl: 0   morphine (MS CONTIN) 60 MG 12 hr tablet, Take 1 tablet (60 mg total) by mouth 3 (three) times daily., Disp: 75 tablet, Rfl: 0   [START ON 01/14/2023] morphine (MS CONTIN) 60 MG  12 hr tablet, Take 1 tablet (60 mg total) by mouth 3 (three) times daily., Disp: 75 tablet, Rfl: 0   naloxone (NARCAN) nasal spray 4 mg/0.1 mL, For excess sedation from opioids., Disp: 1 kit, Rfl: 2   Tolvaptan 90 & 30 MG TBPK, Take 1 tablet by mouth 2 (two) times daily. Takes 60 mg in the AM and 30 mg 8 hours later., Disp: , Rfl:   Past Medical History:  Diagnosis Date   COVID-19 virus infection 05/2020   Hematuria    History of chicken pox    Hypertension    Nephrolithiasis    Polycystic  kidney disease    Right sided sciatica 11/13/2019     Assessment and Plan: 1. Lumbar spondylosis   2. Chronic, continuous use of opioids   3. Chronic pain syndrome   4. Chronic bilateral low back pain with left-sided sciatica   5. DDD (degenerative disc disease), lumbar   6. Polycystic kidney disease   7. Right sided sciatica    Based on conversation and after review of the [facility] practitioner database information I think is appropriate to refill her medicines for the next 2 months.  These will be dated for August 28 and September 27.  I am going to increase her hydrocodone to 60 tablets for twice a day average dosing, up from the baseline 45 and see how she does over the course next 2 months.  We may consider increasing her MS Contin to 90 mg at the next visit.  She is instructed to contact us should she have any problems with this regimen.  Continue exercises as reviewed including stretching strengthening and we will defer on any repeat epidurals at this point.  Continue follow-up with her primary care physicians for baseline medical care and nephrology.  Follow Up Instructions:    I discussed the assessment and treatment plan with the patient. The patient was provided an opportunity to ask questions and all were answered. The patient agreed with the plan and demonstrated an understanding of the instructions.   The patient was advised to call back or seek an in-person evaluation if the symptoms worsen or if the condition fails to improve as anticipated.  I provided 30 minutes of non-face-to-face time during this encounter.   [name], MD

## 2023-02-07 ENCOUNTER — Encounter: Payer: Self-pay | Admitting: Anesthesiology

## 2023-02-07 ENCOUNTER — Ambulatory Visit: Payer: Medicare Other | Attending: Anesthesiology | Admitting: Anesthesiology

## 2023-02-07 VITALS — BP 132/71 | HR 121 | Temp 97.4°F | Resp 18 | Ht 64.0 in | Wt 170.0 lb

## 2023-02-07 DIAGNOSIS — M5442 Lumbago with sciatica, left side: Secondary | ICD-10-CM | POA: Diagnosis not present

## 2023-02-07 DIAGNOSIS — G894 Chronic pain syndrome: Secondary | ICD-10-CM | POA: Diagnosis not present

## 2023-02-07 DIAGNOSIS — Z79891 Long term (current) use of opiate analgesic: Secondary | ICD-10-CM

## 2023-02-07 DIAGNOSIS — M5441 Lumbago with sciatica, right side: Secondary | ICD-10-CM | POA: Diagnosis not present

## 2023-02-07 DIAGNOSIS — M47816 Spondylosis without myelopathy or radiculopathy, lumbar region: Secondary | ICD-10-CM | POA: Insufficient documentation

## 2023-02-07 DIAGNOSIS — F1190 Opioid use, unspecified, uncomplicated: Secondary | ICD-10-CM | POA: Diagnosis present

## 2023-02-07 DIAGNOSIS — M5431 Sciatica, right side: Secondary | ICD-10-CM | POA: Diagnosis present

## 2023-02-07 DIAGNOSIS — G8929 Other chronic pain: Secondary | ICD-10-CM | POA: Insufficient documentation

## 2023-02-07 DIAGNOSIS — Q613 Polycystic kidney, unspecified: Secondary | ICD-10-CM | POA: Diagnosis not present

## 2023-02-07 MED ORDER — MORPHINE SULFATE ER 60 MG PO TBCR: 60.0000 mg | EXTENDED_RELEASE_TABLET | Freq: Three times a day (TID) | ORAL | 0 refills | Status: DC

## 2023-02-07 MED ORDER — CYCLOBENZAPRINE HCL 10 MG PO TABS: ORAL_TABLET | ORAL | 3 refills | Status: DC

## 2023-02-07 MED ORDER — HYDROCODONE-ACETAMINOPHEN 10-325 MG PO TABS
1.0000 | ORAL_TABLET | Freq: Four times a day (QID) | ORAL | 0 refills | Status: DC | PRN
Start: 2023-03-15 — End: 2023-04-11

## 2023-02-07 MED ORDER — HYDROCODONE-ACETAMINOPHEN 10-325 MG PO TABS: 1.0000 | ORAL_TABLET | Freq: Four times a day (QID) | ORAL | 0 refills | Status: AC | PRN

## 2023-02-07 NOTE — Progress Notes (Signed)
Subjective:  Patient ID: Megan Brock, female    DOB: [birth year]  Age: 42 y.o. MRN: [id number]  CC: Leg Pain (right)   Procedure: None  HPI Megan Brock presents for reevaluation.  She continues to do well with her current medical regimen.  She takes her hydrocodone twice a day for breakthrough pain and uses her MS Contin approximately 2-3 times a day for baseline pain control.  The polycystic kidney disease has continued to advance and she will likely require nephrectomy and a likely kidney transplant within the next 3 to 5 years secondary to continuing diminished GFR.  She still has low back pain with some intermittent sciatica that has been worse over the past 3 to 5 days.  She has done well with epidural steroids taking these periodically.  Her last epidural was in August 2023 which gave her 80 to 90% relief of her low back pain and sciatica symptoms but some of this has recurred but the baseline flank pain from the polycystic kidney disease persist.  This is failed more conservative therapy.  Otherwise no change in the quality characteristic or distribution is noted.  Outpatient Medications Prior to Visit  Medication Sig Dispense Refill   furosemide (LASIX) 40 MG tablet Take 40 mg by mouth daily.     lisinopril (ZESTRIL) 10 MG tablet TAKE 1 TABLET(10 MG) BY MOUTH DAILY 14 tablet 0   morphine (MS CONTIN) 60 MG 12 hr tablet Take 1 tablet (60 mg total) by mouth 3 (three) times daily. 75 tablet 0   naloxone (NARCAN) nasal spray 4 mg/0.1 mL For excess sedation from opioids. 1 kit 2   Tolvaptan 90 & 30 MG TBPK Take 1 tablet by mouth 2 (two) times daily. Takes 60 mg in the AM and 30 mg 8 hours later.     cyclobenzaprine (FLEXERIL) 10 MG tablet TAKE 1 TABLET(10 MG) BY MOUTH TWICE DAILY 60 tablet 3   HYDROcodone-acetaminophen (NORCO) 10-325 MG tablet Take 1 tablet by mouth every 6 (six) hours as needed for moderate pain or severe pain. 60 tablet 0   amLODipine (NORVASC) 10 MG tablet       brompheniramine-pseudoephedrine-DM 30-2-10 MG/5ML syrup Take 5 mLs by mouth 4 (four) times daily as needed. 120 mL 0   hydrochlorothiazide (HYDRODIURIL) 25 MG tablet Take 25 mg by mouth daily.     morphine (MS CONTIN) 60 MG 12 hr tablet Take 1 tablet (60 mg total) by mouth 3 (three) times daily. 75 tablet 0   morphine (MS CONTIN) 60 MG 12 hr tablet Take 1 tablet (60 mg total) by mouth 3 (three) times daily. 75 tablet 0   morphine (MS CONTIN) 60 MG 12 hr tablet Take 1 tablet (60 mg total) by mouth 3 (three) times daily. 75 tablet 0   No facility-administered medications prior to visit.    Review of Systems CNS: No confusion or sedation Cardiac: No angina or palpitations GI: No abdominal pain or constipation Constitutional: No nausea vomiting fevers or chills  Objective:  BP 132/71   Pulse (!) 121   Temp (!) 97.4 F (36.3 C)   Resp 18   Ht 5\' 4"  (1.626 m)   Wt 170 lb (77.1 kg)   SpO2 100%   BMI 29.18 kg/m    BP Readings from Last 3 Encounters:  02/07/23 132/71  12/09/21 103/68  06/15/21 (!) 116/94     Wt Readings from Last 3 Encounters:  02/07/23 170 lb (77.1 kg)  12/09/21 170 lb (  77.1 kg)  06/15/21 170 lb (77.1 kg)     Physical Exam Pt is alert and oriented PERRL EOMI HEART IS RRR no murmur or rub LCTA no wheezing or rales MUSCULOSKELETAL reveals some paraspinous muscle tenderness.  She does have an antalgic gait and mild difficulty going from seated to standing but otherwise appears to be doing reasonably well with good muscle tone and bulk retention.  Labs  No results found for: "HGBA1C" Lab Results  Component Value Date   LDLCALC 105 (H) 07/17/2015   CREATININE 0.98 02/08/2017    -------------------------------------------------------------------------------------------------------------------- Lab Results  Component Value Date   WBC 9.2 02/08/2017   HGB 13.2 02/08/2017   HCT 37.7 02/08/2017   PLT 197 02/08/2017   GLUCOSE 87 02/08/2017   CHOL 174  07/17/2015   TRIG 139 07/17/2015   HDL 41 07/17/2015   LDLCALC 105 (H) 07/17/2015   ALT 10 02/08/2017   AST 12 02/08/2017   NA 140 02/08/2017   K 4.0 02/08/2017   CL 105 02/08/2017   CREATININE 0.98 02/08/2017   BUN 18 02/08/2017   CO2 29 02/08/2017   TSH 2.530 06/26/2019    --------------------------------------------------------------------------------------------------------------------- DG PAIN CLINIC C-ARM 1-60 MIN NO REPORT  Result Date: 12/09/2021 Fluoro was used, but no Radiologist interpretation will be provided. Please refer to "NOTES" tab for provider progress note.    Assessment & Plan:   Megan Brock was seen today for leg pain.  Diagnoses and all orders for this visit:  Lumbar spondylosis  Chronic, continuous use of opioids -     ToxASSURE Select 13 (MW), Urine  Polycystic kidney disease  Chronic bilateral low back pain with left-sided sciatica  Right sided sciatica  Chronic pain syndrome -     ToxASSURE Select 13 (MW), Urine  Other orders -     HYDROcodone-acetaminophen (NORCO) 10-325 MG tablet; Take 1 tablet by mouth every 6 (six) hours as needed for moderate pain (pain score 4-6) or severe pain (pain score 7-10). -     HYDROcodone-acetaminophen (NORCO) 10-325 MG tablet; Take 1 tablet by mouth every 6 (six) hours as needed for moderate pain (pain score 4-6) or severe pain (pain score 7-10). -     morphine (MS CONTIN) 60 MG 12 hr tablet; Take 1 tablet (60 mg total) by mouth 3 (three) times daily. -     morphine (MS CONTIN) 60 MG 12 hr tablet; Take 1 tablet (60 mg total) by mouth 3 (three) times daily. -     cyclobenzaprine (FLEXERIL) 10 MG tablet; TAKE 1 TABLET(10 MG) BY MOUTH TWICE DAILY        ----------------------------------------------------------------------------------------------------------------------  Problem List Items Addressed This Visit       Unprioritized   Chronic pain syndrome (Chronic)   Relevant Medications    HYDROcodone-acetaminophen (NORCO) 10-325 MG tablet (Start on 02/13/2023)   HYDROcodone-acetaminophen (NORCO) 10-325 MG tablet (Start on 03/15/2023)   morphine (MS CONTIN) 60 MG 12 hr tablet (Start on 02/13/2023)   morphine (MS CONTIN) 60 MG 12 hr tablet (Start on 03/15/2023)   cyclobenzaprine (FLEXERIL) 10 MG tablet   Other Relevant Orders   ToxASSURE Select 13 (MW), Urine   Lumbar spondylosis - Primary (Chronic)   Relevant Medications   HYDROcodone-acetaminophen (NORCO) 10-325 MG tablet (Start on 02/13/2023)   HYDROcodone-acetaminophen (NORCO) 10-325 MG tablet (Start on 03/15/2023)   morphine (MS CONTIN) 60 MG 12 hr tablet (Start on 02/13/2023)   morphine (MS CONTIN) 60 MG 12 hr tablet (Start on 03/15/2023)   cyclobenzaprine (FLEXERIL)  10 MG tablet   Chronic lower back pain   Relevant Medications   HYDROcodone-acetaminophen (NORCO) 10-325 MG tablet (Start on 02/13/2023)   HYDROcodone-acetaminophen (NORCO) 10-325 MG tablet (Start on 03/15/2023)   morphine (MS CONTIN) 60 MG 12 hr tablet (Start on 02/13/2023)   morphine (MS CONTIN) 60 MG 12 hr tablet (Start on 03/15/2023)   cyclobenzaprine (FLEXERIL) 10 MG tablet   Chronic, continuous use of opioids   Relevant Orders   ToxASSURE Select 13 (MW), Urine   Right sided sciatica   Relevant Medications   cyclobenzaprine (FLEXERIL) 10 MG tablet   Other Visit Diagnoses     Polycystic kidney disease             ----------------------------------------------------------------------------------------------------------------------  1. Lumbar spondylosis Continue core stretching strengthening exercises as reviewed.  2. Chronic, continuous use of opioids I have reviewed the [facility] practitioner database information is appropriate for refills.  This to be dated for October 27 and November 26 with return to clinic scheduled in 2 months.  He continues to respond favorably to chronic opioid management.  We have gone over the risks and  benefits of this.  Unfortunately her disease progression with the polycystic kidney disease continues to create severe low back pain and flank pain. - ToxASSURE Select 13 (MW), Urine  3. Polycystic kidney disease Continue follow-up with nephrology  4. Chronic bilateral low back pain with left-sided sciatica I will schedule her for a repeat epidural sometime in the next 2 to 3 months secondary to the recurrent sciatica symptoms if needed.  5. Right sided sciatica Continue core stretching exercises  6. Chronic pain syndrome As above - ToxASSURE Select 13 (MW), Urine    ----------------------------------------------------------------------------------------------------------------------  I have changed Megan Brock. Megan Brock's HYDROcodone-acetaminophen. I am also having her start on HYDROcodone-acetaminophen. Additionally, I am having her maintain her naloxone, Tolvaptan, amLODipine, lisinopril, hydrochlorothiazide, brompheniramine-pseudoephedrine-DM, morphine, morphine, furosemide, morphine, morphine, and cyclobenzaprine.   Meds ordered this encounter  Medications   HYDROcodone-acetaminophen (NORCO) 10-325 MG tablet    Sig: Take 1 tablet by mouth every 6 (six) hours as needed for moderate pain (pain score 4-6) or severe pain (pain score 7-10).    Dispense:  60 tablet    Refill:  0   HYDROcodone-acetaminophen (NORCO) 10-325 MG tablet    Sig: Take 1 tablet by mouth every 6 (six) hours as needed for moderate pain (pain score 4-6) or severe pain (pain score 7-10).    Dispense:  60 tablet    Refill:  0   morphine (MS CONTIN) 60 MG 12 hr tablet    Sig: Take 1 tablet (60 mg total) by mouth 3 (three) times daily.    Dispense:  75 tablet    Refill:  0   morphine (MS CONTIN) 60 MG 12 hr tablet    Sig: Take 1 tablet (60 mg total) by mouth 3 (three) times daily.    Dispense:  75 tablet    Refill:  0   cyclobenzaprine (FLEXERIL) 10 MG tablet    Sig: TAKE 1 TABLET(10 MG) BY MOUTH TWICE DAILY     Dispense:  60 tablet    Refill:  3   Patient's Medications  New Prescriptions   HYDROCODONE-ACETAMINOPHEN (NORCO) 10-325 MG TABLET    Take 1 tablet by mouth every 6 (six) hours as needed for moderate pain (pain score 4-6) or severe pain (pain score 7-10).  Previous Medications   AMLODIPINE (NORVASC) 10 MG TABLET       BROMPHENIRAMINE-PSEUDOEPHEDRINE-DM 30-2-10 MG/5ML  SYRUP    Take 5 mLs by mouth 4 (four) times daily as needed.   FUROSEMIDE (LASIX) 40 MG TABLET    Take 40 mg by mouth daily.   HYDROCHLOROTHIAZIDE (HYDRODIURIL) 25 MG TABLET    Take 25 mg by mouth daily.   LISINOPRIL (ZESTRIL) 10 MG TABLET    TAKE 1 TABLET(10 MG) BY MOUTH DAILY   MORPHINE (MS CONTIN) 60 MG 12 HR TABLET    Take 1 tablet (60 mg total) by mouth 3 (three) times daily.   MORPHINE (MS CONTIN) 60 MG 12 HR TABLET    Take 1 tablet (60 mg total) by mouth 3 (three) times daily.   NALOXONE (NARCAN) NASAL SPRAY 4 MG/0.1 ML    For excess sedation from opioids.   TOLVAPTAN 90 & 30 MG TBPK    Take 1 tablet by mouth 2 (two) times daily. Takes 60 mg in the AM and 30 mg 8 hours later.  Modified Medications   Modified Medication Previous Medication   CYCLOBENZAPRINE (FLEXERIL) 10 MG TABLET cyclobenzaprine (FLEXERIL) 10 MG tablet      TAKE 1 TABLET(10 MG) BY MOUTH TWICE DAILY    TAKE 1 TABLET(10 MG) BY MOUTH TWICE DAILY   HYDROCODONE-ACETAMINOPHEN (NORCO) 10-325 MG TABLET HYDROcodone-acetaminophen (NORCO) 10-325 MG tablet      Take 1 tablet by mouth every 6 (six) hours as needed for moderate pain (pain score 4-6) or severe pain (pain score 7-10).    Take 1 tablet by mouth every 6 (six) hours as needed for moderate pain or severe pain.   MORPHINE (MS CONTIN) 60 MG 12 HR TABLET morphine (MS CONTIN) 60 MG 12 hr tablet      Take 1 tablet (60 mg total) by mouth 3 (three) times daily.    Take 1 tablet (60 mg total) by mouth 3 (three) times daily.   MORPHINE (MS CONTIN) 60 MG 12 HR TABLET morphine (MS CONTIN) 60 MG 12 hr tablet      Take 1  tablet (60 mg total) by mouth 3 (three) times daily.    Take 1 tablet (60 mg total) by mouth 3 (three) times daily.  Discontinued Medications   No medications on file   ----------------------------------------------------------------------------------------------------------------------  Follow-up: Return in about 2 months (around 04/09/2023) for evaluation, med refill.    [name], MD

## 2023-02-07 NOTE — Progress Notes (Signed)
Nursing Pain Medication Assessment:  Safety precautions to be maintained throughout the outpatient stay will include: orient to surroundings, keep bed in low position, maintain call bell within reach at all times, provide assistance with transfer out of bed and ambulation.  Medication Inspection Compliance: Pill count conducted under aseptic conditions, in front of the patient. Neither the pills nor the bottle was removed from the patient's sight at any time. Once count was completed pills were immediately returned to the patient in their original bottle.  Medication: Morphine ER (MSContin) Pill/Patch Count:  20 of 75 pills remain Pill/Patch Appearance: Markings consistent with prescribed medication Bottle Appearance: Standard pharmacy container. Clearly labeled. Filled Date: 09 / 27 / 2024 Last Medication intake:  Today Hydrocodone 15/60 Filled 09/27/224 Last night

## 2023-02-10 LAB — TOXASSURE SELECT 13 (MW), URINE

## 2023-02-20 DIAGNOSIS — J069 Acute upper respiratory infection, unspecified: Secondary | ICD-10-CM | POA: Diagnosis not present

## 2023-03-25 DIAGNOSIS — N200 Calculus of kidney: Secondary | ICD-10-CM | POA: Diagnosis not present

## 2023-03-25 DIAGNOSIS — Q612 Polycystic kidney, adult type: Secondary | ICD-10-CM | POA: Diagnosis not present

## 2023-03-25 DIAGNOSIS — R809 Proteinuria, unspecified: Secondary | ICD-10-CM | POA: Diagnosis not present

## 2023-03-25 DIAGNOSIS — N1831 Chronic kidney disease, stage 3a: Secondary | ICD-10-CM | POA: Diagnosis not present

## 2023-03-28 DIAGNOSIS — I10 Essential (primary) hypertension: Secondary | ICD-10-CM | POA: Diagnosis not present

## 2023-03-28 DIAGNOSIS — R809 Proteinuria, unspecified: Secondary | ICD-10-CM | POA: Diagnosis not present

## 2023-03-28 DIAGNOSIS — Q612 Polycystic kidney, adult type: Secondary | ICD-10-CM | POA: Diagnosis not present

## 2023-03-28 DIAGNOSIS — N182 Chronic kidney disease, stage 2 (mild): Secondary | ICD-10-CM | POA: Diagnosis not present

## 2023-04-11 ENCOUNTER — Encounter: Payer: Self-pay | Admitting: Anesthesiology

## 2023-04-11 ENCOUNTER — Telehealth: Payer: Medicare Other | Admitting: Anesthesiology

## 2023-04-11 MED ORDER — MORPHINE SULFATE ER 60 MG PO TBCR: 60.0000 mg | EXTENDED_RELEASE_TABLET | Freq: Three times a day (TID) | ORAL | 0 refills | Status: DC

## 2023-04-11 MED ORDER — HYDROCODONE-ACETAMINOPHEN 10-325 MG PO TABS: 1.0000 | ORAL_TABLET | Freq: Three times a day (TID) | ORAL | 0 refills | Status: DC

## 2023-04-11 MED ORDER — HYDROCODONE-ACETAMINOPHEN 10-325 MG PO TABS: 1.0000 | ORAL_TABLET | Freq: Four times a day (QID) | ORAL | 0 refills | Status: AC | PRN

## 2023-04-11 NOTE — Progress Notes (Unsigned)
Virtual Visit via Telephone Note  I connected with Megan Brock on 04/11/23 at  9:20 AM EST by telephone and verified that I am speaking with the correct person using two identifiers.  Location: Patient: Home Provider: Pain control center   I discussed the limitations, risks, security and privacy concerns of performing an evaluation and management service by telephone and the availability of in person appointments. I also discussed with the patient that there may be a patient responsible charge related to this service. The patient expressed understanding and agreed to proceed.   History of Present Illness: I spoke to Megan Brock via telephone as we could not link to the video portion of the conference.  She reports that she is doing reasonably well with no recent changes in her lower back pain.  She still getting some right lower extremity sciatica symptoms but this is generally well-controlled with stretching and her medication management.  She has been on chronic opioid therapy secondary to the low back pain and polycystic kidney disease.  She continues to follow-up with her nephrologist and her kidney function has remained stable.  She is likely going to require a kidney transplant in the not-too-distant future.  At present she is doing well with hydrocodone for breakthrough pain and extended release morphine to keep her baseline pain under control.  This combination has worked well for her for an extended period time and keeps her pain manageable and allows her to stay active functional and sleep well at night.  No change in the quality characteristic or distribution of the pain is noted at this time.  Otherwise she is in her usual state of health.  Review of systems: General: No fevers or chills Pulmonary: No shortness of breath or dyspnea Cardiac: No angina or palpitations or lightheadedness GI: No abdominal pain or constipation Psych: No depression     Observations/Objective:  Current Outpatient Medications:    [START ON 05/13/2023] HYDROcodone-acetaminophen (NORCO) 10-325 MG tablet, Take 1 tablet by mouth in the morning, at noon, and at bedtime., Disp: 60 tablet, Rfl: 0   amLODipine (NORVASC) 10 MG tablet, , Disp: , Rfl:    brompheniramine-pseudoephedrine-DM 30-2-10 MG/5ML syrup, Take 5 mLs by mouth 4 (four) times daily as needed., Disp: 120 mL, Rfl: 0   cyclobenzaprine (FLEXERIL) 10 MG tablet, TAKE 1 TABLET(10 MG) BY MOUTH TWICE DAILY, Disp: 60 tablet, Rfl: 3   furosemide (LASIX) 40 MG tablet, Take 40 mg by mouth daily., Disp: , Rfl:    hydrochlorothiazide (HYDRODIURIL) 25 MG tablet, Take 25 mg by mouth daily., Disp: , Rfl:    [START ON 04/12/2023] HYDROcodone-acetaminophen (NORCO) 10-325 MG tablet, Take 1 tablet by mouth every 6 (six) hours as needed for moderate pain (pain score 4-6) or severe pain (pain score 7-10)., Disp: 60 tablet, Rfl: 0   lisinopril (ZESTRIL) 10 MG tablet, TAKE 1 TABLET(10 MG) BY MOUTH DAILY, Disp: 14 tablet, Rfl: 0   morphine (MS CONTIN) 60 MG 12 hr tablet, Take 1 tablet (60 mg total) by mouth 3 (three) times daily., Disp: 75 tablet, Rfl: 0   morphine (MS CONTIN) 60 MG 12 hr tablet, Take 1 tablet (60 mg total) by mouth 3 (three) times daily., Disp: 75 tablet, Rfl: 0   [START ON 04/12/2023] morphine (MS CONTIN) 60 MG 12 hr tablet, Take 1 tablet (60 mg total) by mouth 3 (three) times daily., Disp: 75 tablet, Rfl: 0   [START ON 05/13/2023] morphine (MS CONTIN) 60 MG 12 hr tablet, Take 1  tablet (60 mg total) by mouth 3 (three) times daily., Disp: 75 tablet, Rfl: 0   naloxone (NARCAN) nasal spray 4 mg/0.1 mL, For excess sedation from opioids., Disp: 1 kit, Rfl: 2   Tolvaptan 90 & 30 MG TBPK, Take 1 tablet by mouth 2 (two) times daily. Takes 60 mg in the AM and 30 mg 8 hours later., Disp: , Rfl:    Past Medical History:  Diagnosis Date   COVID-19 virus infection 05/2020   Hematuria    History of chicken pox     Hypertension    Nephrolithiasis    Polycystic kidney disease    Right sided sciatica 11/13/2019   Assessment and Plan:  1. Lumbar spondylosis   2. Chronic, continuous use of opioids   3. Polycystic kidney disease   4. Chronic bilateral low back pain with left-sided sciatica   5. Right sided sciatica   6. Chronic pain syndrome   7. Left sided sciatica    Based our conversation it is appropriate to refill her medications for the next 2 months.  These will be dated for December 24 secondary to the holiday and January 24.  I have reviewed the [facility] practitioner database information and it is appropriate for refill.  Will schedule her for a 33-month return to clinic.  Continue core stretching strengthening exercises as tolerable and we will defer on any repeat epidural injections at this time.  Continue follow-up with her nephrologist and primary care physicians for baseline medical care. Follow Up Instructions:    I discussed the assessment and treatment plan with the patient. The patient was provided an opportunity to ask questions and all were answered. The patient agreed with the plan and demonstrated an understanding of the instructions.   The patient was advised to call back or seek an in-person evaluation if the symptoms worsen or if the condition fails to improve as anticipated.  I provided 30 minutes of non-face-to-face time during this encounter.   Megan Edwards, MD

## 2023-04-12 ENCOUNTER — Encounter: Payer: Self-pay | Admitting: Anesthesiology

## 2023-04-12 ENCOUNTER — Ambulatory Visit: Payer: Medicare Other | Attending: Anesthesiology | Admitting: Anesthesiology

## 2023-04-12 DIAGNOSIS — G8929 Other chronic pain: Secondary | ICD-10-CM

## 2023-04-12 DIAGNOSIS — Q613 Polycystic kidney, unspecified: Secondary | ICD-10-CM | POA: Diagnosis not present

## 2023-04-12 DIAGNOSIS — M5431 Sciatica, right side: Secondary | ICD-10-CM | POA: Diagnosis not present

## 2023-04-12 DIAGNOSIS — Z79891 Long term (current) use of opiate analgesic: Secondary | ICD-10-CM | POA: Diagnosis not present

## 2023-04-12 DIAGNOSIS — M47816 Spondylosis without myelopathy or radiculopathy, lumbar region: Secondary | ICD-10-CM

## 2023-04-12 DIAGNOSIS — M5442 Lumbago with sciatica, left side: Secondary | ICD-10-CM

## 2023-04-12 DIAGNOSIS — F1190 Opioid use, unspecified, uncomplicated: Secondary | ICD-10-CM

## 2023-04-12 DIAGNOSIS — G894 Chronic pain syndrome: Secondary | ICD-10-CM

## 2023-04-12 DIAGNOSIS — M5432 Sciatica, left side: Secondary | ICD-10-CM

## 2023-05-06 ENCOUNTER — Telehealth: Payer: Medicare Other | Admitting: Family Medicine

## 2023-05-06 DIAGNOSIS — J208 Acute bronchitis due to other specified organisms: Secondary | ICD-10-CM | POA: Diagnosis not present

## 2023-05-06 DIAGNOSIS — B9689 Other specified bacterial agents as the cause of diseases classified elsewhere: Secondary | ICD-10-CM

## 2023-05-06 MED ORDER — PROMETHAZINE-DM 6.25-15 MG/5ML PO SYRP: 5.0000 mL | ORAL_SOLUTION | Freq: Four times a day (QID) | ORAL | 0 refills | Status: AC | PRN

## 2023-05-06 MED ORDER — BENZONATATE 100 MG PO CAPS: 100.0000 mg | ORAL_CAPSULE | Freq: Three times a day (TID) | ORAL | 0 refills | Status: AC | PRN

## 2023-05-06 MED ORDER — AZITHROMYCIN 250 MG PO TABS: ORAL_TABLET | ORAL | 0 refills | Status: AC

## 2023-05-06 NOTE — Progress Notes (Signed)
E-Visit for Cough   We are sorry that you are not feeling well.  Here is how we plan to help!  Based on your presentation I believe you most likely have A cough due to bacteria.  When patients have a fever and a productive cough with a change in color or increased sputum production, we are concerned about bacterial bronchitis.  If left untreated it can progress to pneumonia.  If your symptoms do not improve with your treatment plan it is important that you contact your provider.   I have prescribed Azithromyin 250 mg: two tablets now and then one tablet daily for 4 additonal days    In addition you may use A prescription cough medication called Tessalon Perles 100mg . You may take 1-2 capsules every 8 hours as needed for your cough.  Promethazine DM cough syrup to help with rest.   From your responses in the eVisit questionnaire you describe inflammation in the upper respiratory tract which is causing a significant cough.  This is commonly called Bronchitis and has four common causes:   Allergies Viral Infections Acid Reflux Bacterial Infection Allergies, viruses and acid reflux are treated by controlling symptoms or eliminating the cause. An example might be a cough caused by taking certain blood pressure medications. You stop the cough by changing the medication. Another example might be a cough caused by acid reflux. Controlling the reflux helps control the cough.  USE OF BRONCHODILATOR ("RESCUE") INHALERS: There is a risk from using your bronchodilator too frequently.  The risk is that over-reliance on a medication which only relaxes the muscles surrounding the breathing tubes can reduce the effectiveness of medications prescribed to reduce swelling and congestion of the tubes themselves.  Although you feel brief relief from the bronchodilator inhaler, your asthma may actually be worsening with the tubes becoming more swollen and filled with mucus.  This can delay other crucial treatments, such  as oral steroid medications. If you need to use a bronchodilator inhaler daily, several times per day, you should discuss this with your provider.  There are probably better treatments that could be used to keep your asthma under control.     HOME CARE Only take medications as instructed by your medical team. Complete the entire course of an antibiotic. Drink plenty of fluids and get plenty of rest. Avoid close contacts especially the very young and the elderly Cover your mouth if you cough or cough into your sleeve. Always remember to wash your hands A steam or ultrasonic humidifier can help congestion.   GET HELP RIGHT AWAY IF: You develop worsening fever. You become short of breath You cough up blood. Your symptoms persist after you have completed your treatment plan MAKE SURE YOU  Understand these instructions. Will watch your condition. Will get help right away if you are not doing well or get worse.    Thank you for choosing an e-visit.  Your e-visit answers were reviewed by a board certified advanced clinical practitioner to complete your personal care plan. Depending upon the condition, your plan could have included both over the counter or prescription medications.  Please review your pharmacy choice. Make sure the pharmacy is open so you can pick up prescription now. If there is a problem, you may contact your provider through [employer] and have the prescription routed to another pharmacy.  Your safety is important to [place]. If you have drug allergies check your prescription carefully.   For the next 24 hours you can use  MyChart to ask questions about today's visit, request a non-urgent call back, or ask for a work or school excuse. You will get an email in the next two days asking about your experience. I hope that your e-visit has been valuable and will speed your recovery.  I provided 5 minutes of non face-to-face time during this encounter for chart review,  medication and order placement, as well as and documentation.

## 2023-06-08 ENCOUNTER — Ambulatory Visit: Payer: Medicare Other | Attending: Anesthesiology | Admitting: Anesthesiology

## 2023-06-08 ENCOUNTER — Encounter: Payer: Self-pay | Admitting: Anesthesiology

## 2023-06-08 DIAGNOSIS — M5431 Sciatica, right side: Secondary | ICD-10-CM | POA: Diagnosis not present

## 2023-06-08 DIAGNOSIS — Z79891 Long term (current) use of opiate analgesic: Secondary | ICD-10-CM

## 2023-06-08 DIAGNOSIS — Q613 Polycystic kidney, unspecified: Secondary | ICD-10-CM | POA: Diagnosis not present

## 2023-06-08 DIAGNOSIS — F1190 Opioid use, unspecified, uncomplicated: Secondary | ICD-10-CM

## 2023-06-08 DIAGNOSIS — G894 Chronic pain syndrome: Secondary | ICD-10-CM | POA: Diagnosis not present

## 2023-06-08 DIAGNOSIS — M47816 Spondylosis without myelopathy or radiculopathy, lumbar region: Secondary | ICD-10-CM | POA: Diagnosis not present

## 2023-06-08 MED ORDER — HYDROCODONE-ACETAMINOPHEN 10-325 MG PO TABS: 1.0000 | ORAL_TABLET | Freq: Two times a day (BID) | ORAL | 0 refills | Status: AC

## 2023-06-08 MED ORDER — MORPHINE SULFATE ER 60 MG PO TBCR: 60.0000 mg | EXTENDED_RELEASE_TABLET | Freq: Three times a day (TID) | ORAL | 0 refills | Status: DC

## 2023-06-08 MED ORDER — HYDROCODONE-ACETAMINOPHEN 10-325 MG PO TABS: 1.0000 | ORAL_TABLET | Freq: Two times a day (BID) | ORAL | 0 refills | Status: DC

## 2023-06-08 NOTE — Progress Notes (Signed)
 Virtual Visit via Telephone Note  I connected with [name] on 06/08/23 at  9:00 AM EST by telephone and verified that I am speaking with the correct person using two identifiers.  Location: Patient: Home Provider: Pain control center   I discussed the limitations, risks, security and privacy concerns of performing an evaluation and management service by telephone and the availability of in person appointments. I also discussed with the patient that there may be a patient responsible charge related to this service. The patient expressed understanding and agreed to proceed.   History of Present Illness: I spoke with Megan Brock regarding her low back pain and chronic opioid use via telephone we are unable link for the virtual portion of the conversation.  She reports that she is doing quite well with this current regimen.  She still has some baseline low back pain with some facet of the right sciatica recently but this is generally well-maintained with her current regimen.  She takes her MS Contin 2-3 times a day contingent on the severity of the pain but averages 75 tablets/month and takes her hydrocodone 10 mg tablets for breakthrough pain during the day.  No side effects are reported.  No constipation or excess sedation noted.  Her lower extremity strength function bowel and bladder function is stable.  She does have polycystic kidney disease with chronic flank pain followed by urology and her nephrologist which has been relatively stable regarding creatinine clearance recently.  She ultimately reports that she will likely need a kidney transplant.  Otherwise she is in her usual state of health.   Review of systems: General: No fevers or chills Pulmonary: No shortness of breath or dyspnea Cardiac: No angina or palpitations or lightheadedness GI: No abdominal pain or constipation Psych: No depression  Observations/Objective:  Current Outpatient Medications:    [START ON 07/12/2023]  HYDROcodone-acetaminophen (NORCO) 10-325 MG tablet, Take 1 tablet by mouth 2 (two) times daily., Disp: 60 tablet, Rfl: 0   amLODipine (NORVASC) 10 MG tablet, , Disp: , Rfl:    benzonatate (TESSALON) 100 MG capsule, Take 1 capsule (100 mg total) by mouth 3 (three) times daily as needed for cough., Disp: 30 capsule, Rfl: 0   cyclobenzaprine (FLEXERIL) 10 MG tablet, TAKE 1 TABLET(10 MG) BY MOUTH TWICE DAILY, Disp: 60 tablet, Rfl: 3   furosemide (LASIX) 40 MG tablet, Take 40 mg by mouth daily., Disp: , Rfl:    hydrochlorothiazide (HYDRODIURIL) 25 MG tablet, Take 25 mg by mouth daily., Disp: , Rfl:    [START ON 06/12/2023] HYDROcodone-acetaminophen (NORCO) 10-325 MG tablet, Take 1 tablet by mouth 2 (two) times daily., Disp: 60 tablet, Rfl: 0   lisinopril (ZESTRIL) 10 MG tablet, TAKE 1 TABLET(10 MG) BY MOUTH DAILY, Disp: 14 tablet, Rfl: 0   morphine (MS CONTIN) 60 MG 12 hr tablet, Take 1 tablet (60 mg total) by mouth 3 (three) times daily., Disp: 75 tablet, Rfl: 0   morphine (MS CONTIN) 60 MG 12 hr tablet, Take 1 tablet (60 mg total) by mouth 3 (three) times daily., Disp: 75 tablet, Rfl: 0   [START ON 06/12/2023] morphine (MS CONTIN) 60 MG 12 hr tablet, Take 1 tablet (60 mg total) by mouth 3 (three) times daily., Disp: 75 tablet, Rfl: 0   [START ON 07/12/2023] morphine (MS CONTIN) 60 MG 12 hr tablet, Take 1 tablet (60 mg total) by mouth 3 (three) times daily., Disp: 75 tablet, Rfl: 0   naloxone (NARCAN) nasal spray 4 mg/0.1 mL, For  excess sedation from opioids., Disp: 1 kit, Rfl: 2   promethazine-dextromethorphan (PROMETHAZINE-DM) 6.25-15 MG/5ML syrup, Take 5 mLs by mouth 4 (four) times daily as needed for cough., Disp: 118 mL, Rfl: 0   Tolvaptan 90 & 30 MG TBPK, Take 1 tablet by mouth 2 (two) times daily. Takes 60 mg in the AM and 30 mg 8 hours later., Disp: , Rfl:  Past Medical History:  Diagnosis Date   COVID-19 virus infection 05/2020   Hematuria    History of chicken pox    Hypertension     Nephrolithiasis    Polycystic kidney disease    Right sided sciatica 11/13/2019    Assessment and Plan: 1. Lumbar spondylosis   2. Chronic, continuous use of opioids   3. Chronic pain syndrome   4. Polycystic kidney disease   5. Right sided sciatica    Based our conversation it is appropriate to refill her medicines for the next 2 months.  I have reviewed the [facility] practitioner database information is appropriate.  Refills will be generated from February 23 and March 25 for both the MS Contin and the hydrocodone.  She has Flexeril to assist with muscle spasm in the meantime.  I have encouraged her to continue with ambulation stretching exercises on a daily basis.  Should the sciatica flareup and become more recalcitrant we can proceed with a repeat epidural.  She averages 1/year.  These are quite effective for her additionally.  Continue follow-up with her primary care physicians and nephrologist.  Return to clinic in 2 months.  Follow Up Instructions:    I discussed the assessment and treatment plan with the patient. The patient was provided an opportunity to ask questions and all were answered. The patient agreed with the plan and demonstrated an understanding of the instructions.   The patient was advised to call back or seek an in-person evaluation if the symptoms worsen or if the condition fails to improve as anticipated.  I provided 30 minutes of non-face-to-face time during this encounter.   [name], MD

## 2023-06-09 NOTE — Patient Instructions (Signed)

## 2023-08-02 ENCOUNTER — Ambulatory Visit: Attending: Anesthesiology | Admitting: Anesthesiology

## 2023-08-02 ENCOUNTER — Encounter: Payer: Self-pay | Admitting: Anesthesiology

## 2023-08-02 DIAGNOSIS — M5431 Sciatica, right side: Secondary | ICD-10-CM

## 2023-08-02 DIAGNOSIS — M5442 Lumbago with sciatica, left side: Secondary | ICD-10-CM

## 2023-08-02 DIAGNOSIS — G894 Chronic pain syndrome: Secondary | ICD-10-CM | POA: Diagnosis not present

## 2023-08-02 DIAGNOSIS — Z79891 Long term (current) use of opiate analgesic: Secondary | ICD-10-CM

## 2023-08-02 DIAGNOSIS — G8929 Other chronic pain: Secondary | ICD-10-CM

## 2023-08-02 DIAGNOSIS — M5136 Other intervertebral disc degeneration, lumbar region with discogenic back pain only: Secondary | ICD-10-CM

## 2023-08-02 DIAGNOSIS — M47816 Spondylosis without myelopathy or radiculopathy, lumbar region: Secondary | ICD-10-CM

## 2023-08-02 DIAGNOSIS — F1190 Opioid use, unspecified, uncomplicated: Secondary | ICD-10-CM

## 2023-08-02 DIAGNOSIS — Q613 Polycystic kidney, unspecified: Secondary | ICD-10-CM | POA: Diagnosis not present

## 2023-08-02 MED ORDER — MORPHINE SULFATE ER 60 MG PO TBCR: 60.0000 mg | EXTENDED_RELEASE_TABLET | Freq: Three times a day (TID) | ORAL | 0 refills | Status: DC

## 2023-08-02 MED ORDER — HYDROCODONE-ACETAMINOPHEN 10-325 MG PO TABS: 1.0000 | ORAL_TABLET | Freq: Two times a day (BID) | ORAL | 0 refills | Status: AC

## 2023-08-02 MED ORDER — HYDROCODONE-ACETAMINOPHEN 10-325 MG PO TABS: 1.0000 | ORAL_TABLET | Freq: Two times a day (BID) | ORAL | 0 refills | Status: DC

## 2023-08-02 MED ORDER — CYCLOBENZAPRINE HCL 10 MG PO TABS: 10.0000 mg | ORAL_TABLET | Freq: Two times a day (BID) | ORAL | 0 refills | Status: DC

## 2023-08-02 NOTE — Progress Notes (Unsigned)
 Virtual Visit via Telephone Note  I connected with Megan Brock on 08/02/23 at  1:20 PM EDT by telephone and verified that I am speaking with the correct person using two identifiers.  Location: Patient: Home Provider: Pain control center   I discussed the limitations, risks, security and privacy concerns of performing an evaluation and management service by telephone and the availability of in person appointments. I also discussed with the patient that there may be a patient responsible charge related to this service. The patient expressed understanding and agreed to proceed.   History of Present Illness: I spoke with Megan Brock via telephone as we were unable like for the video portion of carbs.  She reports that she is having some intermittent right and lower leg sciatica but this is generally well-controlled with her MS Contin and hydrocodone.  The combination of opioid medications has kept her pain under decent control for the last several months.  She has had epidurals in the past but most recently back in 2023.  The sciatica generally is tolerable at this point and is worse with inflammation of her kidneys with the polycystic kidney disease.  She continues to have problems with her GFR and some evidence that she is bumped her creatinine and has diminished creatinine clearance.  She is following along with Dr. Erminio Hazy with nephrology.  Otherwise no change in the quality characteristic or distribution of the low back pain is noted.  No change in lower extremity strength or function other than some intermittent sciatica symptoms that are worse with certain activities and then abate.  No change in bowel or bladder function otherwise noted either.  She does well with the medications generally getting about 75 to 80% relief with the combination opioid therapy. Review of systems: General: No fevers or chills Pulmonary: No shortness of breath or dyspnea Cardiac: No angina or palpitations or  lightheadedness GI: No abdominal pain or constipation Psych: No depression     Observations/Objective:  Current Outpatient Medications:    [START ON 09/09/2023] HYDROcodone-acetaminophen (NORCO) 10-325 MG tablet, Take 1 tablet by mouth 2 (two) times daily., Disp: 60 tablet, Rfl: 0   amLODipine (NORVASC) 10 MG tablet, , Disp: , Rfl:    benzonatate (TESSALON) 100 MG capsule, Take 1 capsule (100 mg total) by mouth 3 (three) times daily as needed for cough., Disp: 30 capsule, Rfl: 0   cyclobenzaprine (FLEXERIL) 10 MG tablet, Take 1 tablet (10 mg total) by mouth 2 (two) times daily. TAKE 1 TABLET(10 MG) BY MOUTH TWICE DAILY, Disp: 240 tablet, Rfl: 0   furosemide (LASIX) 40 MG tablet, Take 40 mg by mouth daily., Disp: , Rfl:    hydrochlorothiazide (HYDRODIURIL) 25 MG tablet, Take 25 mg by mouth daily., Disp: , Rfl:    [START ON 08/10/2023] HYDROcodone-acetaminophen (NORCO) 10-325 MG tablet, Take 1 tablet by mouth 2 (two) times daily., Disp: 60 tablet, Rfl: 0   lisinopril (ZESTRIL) 10 MG tablet, TAKE 1 TABLET(10 MG) BY MOUTH DAILY, Disp: 14 tablet, Rfl: 0   morphine (MS CONTIN) 60 MG 12 hr tablet, Take 1 tablet (60 mg total) by mouth 3 (three) times daily., Disp: 75 tablet, Rfl: 0   morphine (MS CONTIN) 60 MG 12 hr tablet, Take 1 tablet (60 mg total) by mouth 3 (three) times daily., Disp: 75 tablet, Rfl: 0   [START ON 08/10/2023] morphine (MS CONTIN) 60 MG 12 hr tablet, Take 1 tablet (60 mg total) by mouth 3 (three) times daily., Disp: 75 tablet, Rfl:  0   [START ON 09/09/2023] morphine (MS CONTIN) 60 MG 12 hr tablet, Take 1 tablet (60 mg total) by mouth 3 (three) times daily., Disp: 75 tablet, Rfl: 0   naloxone (NARCAN) nasal spray 4 mg/0.1 mL, For excess sedation from opioids., Disp: 1 kit, Rfl: 2   promethazine-dextromethorphan (PROMETHAZINE-DM) 6.25-15 MG/5ML syrup, Take 5 mLs by mouth 4 (four) times daily as needed for cough., Disp: 118 mL, Rfl: 0   Tolvaptan 90 & 30 MG TBPK, Take 1 tablet by mouth 2  (two) times daily. Takes 60 mg in the AM and 30 mg 8 hours later., Disp: , Rfl:    Past Medical History:  Diagnosis Date   COVID-19 virus infection 05/2020   Hematuria    History of chicken pox    Hypertension    Nephrolithiasis    Polycystic kidney disease    Right sided sciatica 11/13/2019   Assessment and Plan:  1. Lumbar spondylosis   2. Chronic, continuous use of opioids   3. Chronic pain syndrome   4. Polycystic kidney disease   5. Right sided sciatica   6. Chronic bilateral low back pain with left-sided sciatica   7. Degeneration of intervertebral disc of lumbar region with discogenic back pain     Megan Brock gets appropriate to refill her medicines for the next 2 months we dated for April 23 and May 23.  No other changes in her pharmacologic regimen will be initiated.  I will refill her Flexeril as well and she is getting good relief with muscle spasm with that.  No side effects of the medications are reported and I have reviewed the [place]  practitioner database information as appropriate.  Continue efforts at weight loss stretching strengthening exercises and if she needs a repeat epidural I have made this available to her.  Continue follow-up with Dr. Erminio Hazy and her nephrologist for her kidney care and with her primary medical physicians for baseline medical care with return to clinic in 2 months Follow Up Instructions:    I discussed the assessment and treatment plan with the patient. The patient was provided an opportunity to ask questions and all were answered. The patient agreed with the plan and demonstrated an understanding of the instructions.   The patient was advised to call back or seek an in-person evaluation if the symptoms worsen or if the condition fails to improve as anticipated.  I provided 30 minutes of non-face-to-face time during this encounter.   Zula Hitch, MD

## 2023-10-04 ENCOUNTER — Ambulatory Visit: Attending: Anesthesiology | Admitting: Anesthesiology

## 2023-10-04 ENCOUNTER — Encounter: Payer: Self-pay | Admitting: Anesthesiology

## 2023-10-04 DIAGNOSIS — M47816 Spondylosis without myelopathy or radiculopathy, lumbar region: Secondary | ICD-10-CM

## 2023-10-04 DIAGNOSIS — M5431 Sciatica, right side: Secondary | ICD-10-CM

## 2023-10-04 DIAGNOSIS — M5136 Other intervertebral disc degeneration, lumbar region with discogenic back pain only: Secondary | ICD-10-CM

## 2023-10-04 DIAGNOSIS — Q613 Polycystic kidney, unspecified: Secondary | ICD-10-CM

## 2023-10-04 DIAGNOSIS — F1190 Opioid use, unspecified, uncomplicated: Secondary | ICD-10-CM

## 2023-10-04 DIAGNOSIS — G8929 Other chronic pain: Secondary | ICD-10-CM

## 2023-10-04 DIAGNOSIS — Z79891 Long term (current) use of opiate analgesic: Secondary | ICD-10-CM

## 2023-10-04 DIAGNOSIS — G894 Chronic pain syndrome: Secondary | ICD-10-CM

## 2023-10-04 MED ORDER — MORPHINE SULFATE ER 60 MG PO TBCR: 60.0000 mg | EXTENDED_RELEASE_TABLET | Freq: Three times a day (TID) | ORAL | 0 refills | Status: DC

## 2023-10-04 MED ORDER — CYCLOBENZAPRINE HCL 10 MG PO TABS: 10.0000 mg | ORAL_TABLET | Freq: Two times a day (BID) | ORAL | 3 refills | Status: AC

## 2023-10-04 MED ORDER — HYDROCODONE-ACETAMINOPHEN 10-325 MG PO TABS: 1.0000 | ORAL_TABLET | Freq: Two times a day (BID) | ORAL | 0 refills | Status: DC

## 2023-10-04 MED ORDER — HYDROCODONE-ACETAMINOPHEN 10-325 MG PO TABS: 1.0000 | ORAL_TABLET | Freq: Two times a day (BID) | ORAL | 0 refills | Status: AC

## 2023-10-04 NOTE — Progress Notes (Signed)
 Virtual Visit via Telephone Note  I connected with Megan Brock on 10/04/23 at  4:40 PM EDT by telephone and verified that I am speaking with the correct person using two identifiers.  Location: Patient: Home Provider: Pain control center   I discussed the limitations, risks, security and privacy concerns of performing an evaluation and management service by telephone and the availability of in person appointments. I also discussed with the patient that there may be a patient responsible charge related to this service. The patient expressed understanding and agreed to proceed.   History of Present Illness: I spoke with Megan Brock via telephone as we could not like for the video portion of the conference.  She seems to be doing well and reports that she still having right leg sciatica symptoms that can be at times severe.  She has had previous epidural steroid injections that have worked quite well for her generally giving her 80 to 100% relief with almost no pain for about 3 months following injection but less intense pain thereafter.  Her last injection was back in August 2023.  She still having some spasming in the right posterior lateral leg consistent with the previous sciatica she has had.  However she feels that she can hold off at this point with a repeat epidural but feels that she is getting close to needing 1.  She is doing her stretching strengthening exercises as tolerated applying ice and heat to the right flank and right lower back as needed.  She is also taking her medications that are keeping her pain under good control.  She has had the requirement for chronic opioid therapy secondary to the chronicity of her pain and also her bilateral flank pain secondary to her polycystic kidney disease.  She has failed more conservative therapy but her current regimen taking her hydrocodone  10 mg tablets twice a day and her extended release morphine  2-3 times a day keeps her active functional  and her pain under decent control.  She continues to follow-up with her nephrologist Dr. Erminio Hazy and is due to see him in 2 weeks.  Her GFR has been reasonably stable as of late however she likely will require an eventual kidney transplant per report today.  Otherwise she is in her usual state of health.  Review of systems: General: No fevers or chills Pulmonary: No shortness of breath or dyspnea Cardiac: No angina or palpitations or lightheadedness GI: No abdominal pain or constipation Psych: No depression    Observations/Objective:  Current Outpatient Medications:    amLODipine  (NORVASC ) 10 MG tablet, , Disp: , Rfl:    benzonatate  (TESSALON ) 100 MG capsule, Take 1 capsule (100 mg total) by mouth 3 (three) times daily as needed for cough., Disp: 30 capsule, Rfl: 0   cyclobenzaprine  (FLEXERIL ) 10 MG tablet, Take 1 tablet (10 mg total) by mouth 2 (two) times daily. TAKE 1 TABLET(10 MG) BY MOUTH TWICE DAILY, Disp: 60 tablet, Rfl: 3   furosemide (LASIX) 40 MG tablet, Take 40 mg by mouth daily., Disp: , Rfl:    hydrochlorothiazide (HYDRODIURIL) 25 MG tablet, Take 25 mg by mouth daily., Disp: , Rfl:    [START ON 10/09/2023] HYDROcodone -acetaminophen  (NORCO) 10-325 MG tablet, Take 1 tablet by mouth 2 (two) times daily., Disp: 60 tablet, Rfl: 0   [START ON 11/08/2023] HYDROcodone -acetaminophen  (NORCO) 10-325 MG tablet, Take 1 tablet by mouth 2 (two) times daily., Disp: 60 tablet, Rfl: 0   lisinopril  (ZESTRIL ) 10 MG tablet, TAKE 1 TABLET(10 MG)  BY MOUTH DAILY, Disp: 14 tablet, Rfl: 0   morphine  (MS CONTIN ) 60 MG 12 hr tablet, Take 1 tablet (60 mg total) by mouth 3 (three) times daily., Disp: 75 tablet, Rfl: 0   morphine  (MS CONTIN ) 60 MG 12 hr tablet, Take 1 tablet (60 mg total) by mouth 3 (three) times daily., Disp: 75 tablet, Rfl: 0   [START ON 10/09/2023] morphine  (MS CONTIN ) 60 MG 12 hr tablet, Take 1 tablet (60 mg total) by mouth 3 (three) times daily., Disp: 75 tablet, Rfl: 0   [START ON 11/08/2023]  morphine  (MS CONTIN ) 60 MG 12 hr tablet, Take 1 tablet (60 mg total) by mouth 3 (three) times daily., Disp: 75 tablet, Rfl: 0   naloxone  (NARCAN ) nasal spray 4 mg/0.1 mL, For excess sedation from opioids., Disp: 1 kit, Rfl: 2   promethazine -dextromethorphan (PROMETHAZINE -DM) 6.25-15 MG/5ML syrup, Take 5 mLs by mouth 4 (four) times daily as needed for cough., Disp: 118 mL, Rfl: 0   Tolvaptan 90 & 30 MG TBPK, Take 1 tablet by mouth 2 (two) times daily. Takes 60 mg in the AM and 30 mg 8 hours later., Disp: , Rfl:    Past Medical History:  Diagnosis Date   COVID-19 virus infection 05/2020   Hematuria    History of chicken pox    Hypertension    Nephrolithiasis    Polycystic kidney disease    Right sided sciatica 11/13/2019   Assessment and Plan:  1. Lumbar spondylosis   2. Chronic, continuous use of opioids   3. Chronic pain syndrome   4. Polycystic kidney disease   5. Right sided sciatica   6. Chronic bilateral low back pain with left-sided sciatica   7. Degeneration of intervertebral disc of lumbar region with discogenic back pain    Based on our conversation and after review of the [place]  practitioner database information think is appropriate to refill her medicines for the next 2 months.  This will be dated for June 22 and July 22.  I have encouraged her to continue efforts at weight control and stretching strengthening with heat and cold application as needed for her flank pain.  Continue use of her TENS unit.  Continue with her current medication management which is working well for her and enabling her to stay functionally active where without her medications she is severely limited.  If she needs a repeat epidural for the recurrent right leg sciatica I have requested that she contact us  and we will get that scheduled.  Continue follow-up with Dr. Erminio Hazy for nephrology and her primary care physicians for baseline medical care with scheduled return in 2 months Follow Up  Instructions:    I discussed the assessment and treatment plan with the patient. The patient was provided an opportunity to ask questions and all were answered. The patient agreed with the plan and demonstrated an understanding of the instructions.   The patient was advised to call back or seek an in-person evaluation if the symptoms worsen or if the condition fails to improve as anticipated.  I provided 30 minutes of non-face-to-face time during this encounter.   Zula Hitch, MD

## 2023-11-30 ENCOUNTER — Encounter: Payer: Self-pay | Admitting: Anesthesiology

## 2023-11-30 ENCOUNTER — Ambulatory Visit: Attending: Anesthesiology | Admitting: Anesthesiology

## 2023-11-30 DIAGNOSIS — G894 Chronic pain syndrome: Secondary | ICD-10-CM

## 2023-11-30 DIAGNOSIS — M5442 Lumbago with sciatica, left side: Secondary | ICD-10-CM

## 2023-11-30 DIAGNOSIS — M5431 Sciatica, right side: Secondary | ICD-10-CM

## 2023-11-30 DIAGNOSIS — G8929 Other chronic pain: Secondary | ICD-10-CM

## 2023-11-30 DIAGNOSIS — Q613 Polycystic kidney, unspecified: Secondary | ICD-10-CM | POA: Diagnosis not present

## 2023-11-30 DIAGNOSIS — M47816 Spondylosis without myelopathy or radiculopathy, lumbar region: Secondary | ICD-10-CM | POA: Diagnosis not present

## 2023-11-30 DIAGNOSIS — M5136 Other intervertebral disc degeneration, lumbar region with discogenic back pain only: Secondary | ICD-10-CM

## 2023-11-30 DIAGNOSIS — Z79891 Long term (current) use of opiate analgesic: Secondary | ICD-10-CM

## 2023-11-30 DIAGNOSIS — F1190 Opioid use, unspecified, uncomplicated: Secondary | ICD-10-CM

## 2023-11-30 MED ORDER — HYDROCODONE-ACETAMINOPHEN 10-325 MG PO TABS: 1.0000 | ORAL_TABLET | Freq: Two times a day (BID) | ORAL | 0 refills | Status: DC

## 2023-11-30 MED ORDER — MORPHINE SULFATE ER 60 MG PO TBCR: 60.0000 mg | EXTENDED_RELEASE_TABLET | Freq: Three times a day (TID) | ORAL | 0 refills | Status: DC

## 2023-11-30 MED ORDER — HYDROCODONE-ACETAMINOPHEN 10-325 MG PO TABS: 1.0000 | ORAL_TABLET | Freq: Four times a day (QID) | ORAL | 0 refills | Status: DC | PRN

## 2023-12-06 ENCOUNTER — Telehealth: Payer: Self-pay | Admitting: Anesthesiology

## 2023-12-06 NOTE — Telephone Encounter (Signed)
 No one has Morhpine Sulfate to fill her script please advise

## 2023-12-07 ENCOUNTER — Telehealth: Payer: Self-pay | Admitting: Anesthesiology

## 2023-12-07 NOTE — Telephone Encounter (Signed)
 Spoke with Dr. Myra, he instructed patient to double the Hydrocodone , take qid and call us  in 1 week.

## 2023-12-07 NOTE — Telephone Encounter (Signed)
 PT called stated that she never got a called on yesterday. PT stated pharmacy no where has the morphine  60mg .PT stated that she is out. Please give patient a call. TY

## 2023-12-07 NOTE — Telephone Encounter (Signed)
 Patient informed.

## 2023-12-14 ENCOUNTER — Telehealth: Payer: Self-pay

## 2023-12-14 MED ORDER — MORPHINE SULFATE ER 30 MG PO TBCR: 60.0000 mg | EXTENDED_RELEASE_TABLET | Freq: Three times a day (TID) | ORAL | 0 refills | Status: DC

## 2023-12-14 NOTE — Telephone Encounter (Signed)
 Message sent to dr Myra.

## 2023-12-14 NOTE — Progress Notes (Signed)
 Virtual Visit via Telephone Note  I connected with Megan Brock on 12/14/23 at  4:40 PM EDT by telephone and verified that I am speaking with the correct person using two identifiers.  Location: Patient: Home Provider: Pain control center   I discussed the limitations, risks, security and privacy concerns of performing an evaluation and management service by telephone and the availability of in person appointments. I also discussed with the patient that there may be a patient responsible charge related to this service. The patient expressed understanding and agreed to proceed.   History of Present Illness: I spoke with Megan Brock today via telephone as we are unable link for the video portion of the conference and she reports that she is doing quite well.  Her flank pain and sciatica pain are generally under good control with her current regimen.  No side effects with the medicines are noted.  She occasionally has trouble getting them filled at her pharmacy.  This has been a intermittent problem for her but with the medicines in her system she generally has good pain control.  She still has occasional breakthrough pain and uses her short acting medications for that.  Otherwise no change in the quality characteristic or distribution of her pain are noted at this time.  She is trying to stay active especially with the warmer weather doing her daily activities with stretching strengthening and core strengthening and these are working well for her.  Review of systems: General: No fevers or chills Pulmonary: No shortness of breath or dyspnea Cardiac: No angina or palpitations or lightheadedness GI: No abdominal pain or constipation Psych: No depression    Observations/Objective:  Current Outpatient Medications:    [START ON 01/05/2024] HYDROcodone -acetaminophen  (NORCO) 10-325 MG tablet, Take 1 tablet by mouth every 6 (six) hours as needed for moderate pain (pain score 4-6) or severe pain  (pain score 7-10)., Disp: 60 tablet, Rfl: 0   amLODipine  (NORVASC ) 10 MG tablet, , Disp: , Rfl:    benzonatate  (TESSALON ) 100 MG capsule, Take 1 capsule (100 mg total) by mouth 3 (three) times daily as needed for cough., Disp: 30 capsule, Rfl: 0   furosemide (LASIX) 40 MG tablet, Take 40 mg by mouth daily., Disp: , Rfl:    hydrochlorothiazide (HYDRODIURIL) 25 MG tablet, Take 25 mg by mouth daily., Disp: , Rfl:    HYDROcodone -acetaminophen  (NORCO) 10-325 MG tablet, Take 1 tablet by mouth 2 (two) times daily., Disp: 60 tablet, Rfl: 0   lisinopril  (ZESTRIL ) 10 MG tablet, TAKE 1 TABLET(10 MG) BY MOUTH DAILY, Disp: 14 tablet, Rfl: 0   morphine  (MS CONTIN ) 60 MG 12 hr tablet, Take 1 tablet (60 mg total) by mouth 3 (three) times daily., Disp: 75 tablet, Rfl: 0   morphine  (MS CONTIN ) 60 MG 12 hr tablet, Take 1 tablet (60 mg total) by mouth 3 (three) times daily., Disp: 75 tablet, Rfl: 0   morphine  (MS CONTIN ) 60 MG 12 hr tablet, Take 1 tablet (60 mg total) by mouth 3 (three) times daily., Disp: 75 tablet, Rfl: 0   [START ON 01/05/2024] morphine  (MS CONTIN ) 60 MG 12 hr tablet, Take 1 tablet (60 mg total) by mouth 3 (three) times daily., Disp: 75 tablet, Rfl: 0   naloxone  (NARCAN ) nasal spray 4 mg/0.1 mL, For excess sedation from opioids., Disp: 1 kit, Rfl: 2   promethazine -dextromethorphan (PROMETHAZINE -DM) 6.25-15 MG/5ML syrup, Take 5 mLs by mouth 4 (four) times daily as needed for cough., Disp: 118 mL, Rfl: 0  Tolvaptan 90 & 30 MG TBPK, Take 1 tablet by mouth 2 (two) times daily. Takes 60 mg in the AM and 30 mg 8 hours later., Disp: , Rfl:   Past Medical History:  Diagnosis Date   COVID-19 virus infection 05/2020   Hematuria    History of chicken pox    Hypertension    Nephrolithiasis    Polycystic kidney disease    Right sided sciatica 11/13/2019    Assessment and Plan: 1. Lumbar spondylosis   2. Chronic, continuous use of opioids   3. Chronic pain syndrome   4. Polycystic kidney disease   5.  Chronic bilateral low back pain with left-sided sciatica   6. Degeneration of intervertebral disc of lumbar region with discogenic back pain   7. Right sided sciatica    Based on conversation today and after review of the [place]  practitioner database information I think is appropriate to refill her medicines for the next 2 months.Has been called in for August 19 and September 18.  No other changes in her regimen will be initiated.  Have encouraged her to continue with stretching strengthening exercises and we will have her follow-up with her primary care physicians in addition to her nephrologist for her renal function and her polycystic kidney disease with return to clinic scheduled in 2 months.  Follow Up Instructions:    I discussed the assessment and treatment plan with the patient. The patient was provided an opportunity to ask questions and all were answered. The patient agreed with the plan and demonstrated an understanding of the instructions.   The patient was advised to call back or seek an in-person evaluation if the symptoms worsen or if the condition fails to improve as anticipated.  I provided 30 minutes of non-face-to-face time during this encounter.   Lynwood KANDICE Clause, MD

## 2023-12-14 NOTE — Telephone Encounter (Signed)
 [facility] in [place] has her 30 mg meds if Dr. Myra can call that in for her. Dr. Myra told her to call back and have us  call him about this.

## 2023-12-14 NOTE — Addendum Note (Signed)
 Addended by: MYRA LYNWOOD MATSU on: 12/14/2023 06:49 PM   Modules accepted: Orders

## 2024-01-06 ENCOUNTER — Telehealth: Admitting: Physician Assistant

## 2024-01-06 DIAGNOSIS — H6991 Unspecified Eustachian tube disorder, right ear: Secondary | ICD-10-CM | POA: Diagnosis not present

## 2024-01-06 MED ORDER — FLUTICASONE PROPIONATE 50 MCG/ACT NA SUSP: 2.0000 | Freq: Every day | NASAL | 6 refills | Status: AC

## 2024-01-06 NOTE — Progress Notes (Signed)
 I have spent 5 minutes in review of e-visit questionnaire, review and updating patient chart, medical decision making and response to patient.   Laure Kidney, PA-C

## 2024-01-06 NOTE — Progress Notes (Signed)
E-Visit for Ear Pain - Eustachian Tube Dysfunction   We are sorry that you are not feeling well. Here is how we plan to help!  Based on what you have shared with me it looks like you have Eustachian Tube Dysfunction.  Eustachian Tube Dysfunction is a condition where the tubes that connect your middle ears to your upper throat become blocked. This can lead to discomfort, hearing difficulties and a feeling of fullness in your ear. Eustachian tube dysfunction usually resolves itself in a few days. The usual symptoms include: Hearing problems Tinnitus, or ringing in your ears Clicking or popping sounds A feeling of fullness in your ears Pain that mimics an ear infection Dizziness, vertigo or balance problems A "tickling" sensation in your ears  ?Eustachian tube dysfunction symptoms may get worse in higher altitudes. This is called barotrauma, and it can happen while scuba diving, flying in an airplane or driving in the mountains.   What causes eustachian tube dysfunction? Allergies and infections (like the common cold and the flu) are the most common causes of eustachian tube dysfunction. These conditions can cause inflammation and mucus buildup, leading to blockage. GERD, or chronic acid reflux, can also cause ETD. This is because stomach acid can back up into your throat and result in inflammation. As mentioned above, altitude changes can also cause ETD.   What are some common eustachian tube dysfunction treatments? In most cases, treatment isn't necessary because ETD often resolves on its own. However, you might need treatment if your symptoms linger for more than two weeks.    Eustachian tube dysfunction treatment depends on the cause and the severity of your condition. Treatments may include home remedies, medications or, in severe cases, surgery.     HOME CARE: Sometimes simple home remedies can help with mild cases of eustachian tube dysfunction. To try and clear the blockage, you  can: Chew gum. Yawn. Swallow. Try the Valsalva maneuver (breathing out forcefully while closing your mouth and pinching your nostrils). Use a saline spray to clear out nasal passages.  MEDICATIONS: Over-the-counter medications can help if allergies are causing eustachian tube dysfunction. Try antihistamines (like cetirizine or diphenhydramine) to ease your symptoms. If you have discomfort, pain relievers -- such as acetaminophen or ibuprofen -- can help.  Sometimes intranasal glucocorticosteroids (like Flonase or Nasacort) help.  I have prescribed Fluticasone 50 mcg/spray 2 sprays in each nostril daily for 10-14 days    GET HELP RIGHT AWAY IF: Fever is over 102.2 degrees. You develop progressive ear pain or hearing loss. Ear symptoms persist longer than 3 days after treatment.  MAKE SURE YOU: Understand these instructions. Will watch your condition. Will get help right away if you are not doing well or get worse.  Thank you for choosing an e-visit.  Your e-visit answers were reviewed by a board certified advanced clinical practitioner to complete your personal care plan. Depending upon the condition, your plan could have included both over the counter or prescription medications.  Please review your pharmacy choice. Make sure the pharmacy is open so you can pick up the prescription now. If there is a problem, you may contact your provider through MyChart messaging and have the prescription routed to another pharmacy.  Your safety is important to us. If you have drug allergies check your prescription carefully.   For the next 24 hours you can use MyChart to ask questions about today's visit, request a non-urgent call back, or ask for a work or school excuse. You will   get an email with a survey after your eVisit asking about your experience. We would appreciate your feedback. I hope that your e-visit has been valuable and will aid in your recovery.      

## 2024-01-09 ENCOUNTER — Encounter: Payer: Self-pay | Admitting: Anesthesiology

## 2024-01-09 ENCOUNTER — Ambulatory Visit (HOSPITAL_BASED_OUTPATIENT_CLINIC_OR_DEPARTMENT_OTHER): Admitting: Anesthesiology

## 2024-01-09 ENCOUNTER — Other Ambulatory Visit: Payer: Self-pay | Admitting: Anesthesiology

## 2024-01-09 ENCOUNTER — Ambulatory Visit
Admission: RE | Admit: 2024-01-09 | Discharge: 2024-01-09 | Disposition: A | Discharge status: Home / Self Care | Source: Ambulatory Visit | Attending: Anesthesiology | Admitting: Anesthesiology

## 2024-01-09 VITALS — BP 110/58 | Temp 98.6°F | Resp 16 | Ht 64.0 in | Wt 175.0 lb

## 2024-01-09 DIAGNOSIS — R52 Pain, unspecified: Secondary | ICD-10-CM

## 2024-01-09 DIAGNOSIS — M5431 Sciatica, right side: Secondary | ICD-10-CM | POA: Insufficient documentation

## 2024-01-09 DIAGNOSIS — Q613 Polycystic kidney, unspecified: Secondary | ICD-10-CM | POA: Insufficient documentation

## 2024-01-09 DIAGNOSIS — M5136 Other intervertebral disc degeneration, lumbar region with discogenic back pain only: Secondary | ICD-10-CM

## 2024-01-09 DIAGNOSIS — M5442 Lumbago with sciatica, left side: Secondary | ICD-10-CM | POA: Diagnosis present

## 2024-01-09 DIAGNOSIS — F1190 Opioid use, unspecified, uncomplicated: Secondary | ICD-10-CM | POA: Insufficient documentation

## 2024-01-09 DIAGNOSIS — G8929 Other chronic pain: Secondary | ICD-10-CM | POA: Insufficient documentation

## 2024-01-09 DIAGNOSIS — M47816 Spondylosis without myelopathy or radiculopathy, lumbar region: Secondary | ICD-10-CM | POA: Diagnosis not present

## 2024-01-09 DIAGNOSIS — G894 Chronic pain syndrome: Secondary | ICD-10-CM | POA: Diagnosis present

## 2024-01-09 MED ORDER — ROPIVACAINE HCL 2 MG/ML IJ SOLN
10.0000 mL | Freq: Once | INTRAMUSCULAR | Status: AC
  Administered 2024-01-09: 10 mL via EPIDURAL

## 2024-01-09 MED ORDER — TRIAMCINOLONE ACETONIDE 40 MG/ML IJ SUSP
INTRAMUSCULAR | Status: AC
  Filled 2024-01-09: qty 1

## 2024-01-09 MED ORDER — IOHEXOL 180 MG/ML  SOLN
INTRAMUSCULAR | Status: AC
  Filled 2024-01-09: qty 20

## 2024-01-09 MED ORDER — SODIUM CHLORIDE (PF) 0.9 % IJ SOLN
INTRAMUSCULAR | Status: AC
  Filled 2024-01-09: qty 10

## 2024-01-09 MED ORDER — IOHEXOL 180 MG/ML  SOLN
10.0000 mL | Freq: Once | INTRAMUSCULAR | Status: AC | PRN
  Administered 2024-01-09: 10 mL via EPIDURAL

## 2024-01-09 MED ORDER — SODIUM CHLORIDE 0.9% FLUSH
10.0000 mL | Freq: Once | INTRAVENOUS | Status: AC
  Administered 2024-01-09: 10 mL

## 2024-01-09 MED ORDER — LIDOCAINE HCL (PF) 1 % IJ SOLN
INTRAMUSCULAR | Status: AC
  Filled 2024-01-09: qty 5

## 2024-01-09 MED ORDER — ROPIVACAINE HCL 2 MG/ML IJ SOLN
INTRAMUSCULAR | Status: AC
  Filled 2024-01-09: qty 20

## 2024-01-09 MED ORDER — TRIAMCINOLONE ACETONIDE 40 MG/ML IJ SUSP
40.0000 mg | Freq: Once | INTRAMUSCULAR | Status: AC
  Administered 2024-01-09: 40 mg

## 2024-01-09 MED ORDER — LIDOCAINE HCL (PF) 1 % IJ SOLN
5.0000 mL | Freq: Once | INTRAMUSCULAR | Status: AC
  Administered 2024-01-09: 5 mL via SUBCUTANEOUS

## 2024-01-09 NOTE — Patient Instructions (Signed)

## 2024-01-09 NOTE — Progress Notes (Signed)
 Nursing Pain Medication Assessment:  Safety precautions to be maintained throughout the outpatient stay will include: orient to surroundings, keep bed in low position, maintain call bell within reach at all times, provide assistance with transfer out of bed and ambulation.  Medication Inspection Compliance: Pill count conducted under aseptic conditions, in front of the patient. Neither the pills nor the bottle was removed from the patient's sight at any time. Once count was completed pills were immediately returned to the patient in their original bottle.  Medication: Morphine  IR Pill/Patch Count: 28 of 150 pills/patches remain Pill/Patch Appearance: Markings consistent with prescribed medication Bottle Appearance: Standard pharmacy container. Clearly labeled. Filled Date: 08/ 27 / 2025 Last Medication intake:  Today  Hydrocodone  59/60 Filled 01-06-2024

## 2024-01-09 NOTE — Progress Notes (Deleted)
 Safety precautions to be maintained throughout the outpatient stay will include: orient to surroundings, keep bed in low position, maintain call bell within reach at all times, provide assistance with transfer out of bed and ambulation.

## 2024-01-09 NOTE — Progress Notes (Signed)
 Subjective:  Patient ID: Megan Brock, female    DOB: 1980-10-16  Age: 43 y.o. MRN: [id number]  CC: Back Pain   Procedure: L5-S1 epidural steroid injection guidance with no sedation  HPI KATHLEAN CINCO presents for reevaluation and is complaining of persistent right sided sciatica with radiation of pain going into the base of the calf and bottom of her right foot with numbness and tingling affecting the bottom of the right foot.  She has had this in the past and has responded favorably to epidural steroid injections.  Her last injection was in August 2023 and she described about a 90% reduction in the sciatica symptoms and a 70% reduction in her chronic low back pain.  She does suffer from polycystic kidney disease and has chronic inflammation and dilation of the kidney which causes intractable low back pain.  She has responded to chronic opioid therapy.  This continues to work well for her and she is without side effect.  Otherwise she is in her usual state of health at this time.  She denies any change in strength or bowel or bladder function presently.  Outpatient Medications Prior to Visit  Medication Sig Dispense Refill   fluticasone  (FLONASE ) 50 MCG/ACT nasal spray Place 2 sprays into both nostrils daily. 16 g 6   furosemide (LASIX) 40 MG tablet Take 40 mg by mouth daily.     HYDROcodone -acetaminophen  (NORCO) 10-325 MG tablet Take 1 tablet by mouth 2 (two) times daily. 60 tablet 0   HYDROcodone -acetaminophen  (NORCO) 10-325 MG tablet Take 1 tablet by mouth every 6 (six) hours as needed for moderate pain (pain score 4-6) or severe pain (pain score 7-10). 60 tablet 0   lisinopril  (ZESTRIL ) 10 MG tablet TAKE 1 TABLET(10 MG) BY MOUTH DAILY 14 tablet 0   morphine  (MS CONTIN ) 30 MG 12 hr tablet Take 2 tablets (60 mg total) by mouth 3 (three) times daily. 150 tablet 0   morphine  (MS CONTIN ) 60 MG 12 hr tablet Take 1 tablet (60 mg total) by mouth 3 (three) times daily. 75 tablet 0   morphine   (MS CONTIN ) 60 MG 12 hr tablet Take 1 tablet (60 mg total) by mouth 3 (three) times daily. 75 tablet 0   morphine  (MS CONTIN ) 60 MG 12 hr tablet Take 1 tablet (60 mg total) by mouth 3 (three) times daily. 75 tablet 0   morphine  (MS CONTIN ) 60 MG 12 hr tablet Take 1 tablet (60 mg total) by mouth 3 (three) times daily. 75 tablet 0   naloxone  (NARCAN ) nasal spray 4 mg/0.1 mL For excess sedation from opioids. 1 kit 2   Tolvaptan 90 & 30 MG TBPK Take 1 tablet by mouth 2 (two) times daily. Takes 60 mg in the AM and 30 mg 8 hours later.     amLODipine  (NORVASC ) 10 MG tablet      benzonatate  (TESSALON ) 100 MG capsule Take 1 capsule (100 mg total) by mouth 3 (three) times daily as needed for cough. 30 capsule 0   hydrochlorothiazide (HYDRODIURIL) 25 MG tablet Take 25 mg by mouth daily.     promethazine -dextromethorphan (PROMETHAZINE -DM) 6.25-15 MG/5ML syrup Take 5 mLs by mouth 4 (four) times daily as needed for cough. 118 mL 0   No facility-administered medications prior to visit.    Review of Systems CNS: No confusion or sedation Cardiac: No angina or palpitations GI: No abdominal pain or constipation Constitutional: No nausea vomiting fevers or chills  Objective:  BP (!) 110/58  Temp 98.6 F (37 C)   Resp 16   Ht 5' 4 (1.626 m)   Wt 175 lb (79.4 kg)   SpO2 99%   BMI 30.04 kg/m    BP Readings from Last 3 Encounters:  01/09/24 (!) 110/58  02/07/23 132/71  12/09/21 103/68     Wt Readings from Last 3 Encounters:  01/09/24 175 lb (79.4 kg)  02/07/23 170 lb (77.1 kg)  12/09/21 170 lb (77.1 kg)     Physical Exam Pt is alert and oriented PERRL EOMI HEART IS RRR no murmur or rub LCTA no wheezing or rales MUSCULOSKELETAL reveals some paraspinous muscle tenderness but no overt trigger points.  She does have a positive straight leg raise on the right side negative on the left and she is walking with a mildly antalgic gait.  Muscle tone and bulk is at baseline.  Labs  No results  found for: HGBA1C Lab Results  Component Value Date   LDLCALC 105 (H) 07/17/2015   CREATININE 0.98 02/08/2017    -------------------------------------------------------------------------------------------------------------------- Lab Results  Component Value Date   WBC 9.2 02/08/2017   HGB 13.2 02/08/2017   HCT 37.7 02/08/2017   PLT 197 02/08/2017   GLUCOSE 87 02/08/2017   CHOL 174 07/17/2015   TRIG 139 07/17/2015   HDL 41 07/17/2015   LDLCALC 105 (H) 07/17/2015   ALT 10 02/08/2017   AST 12 02/08/2017   NA 140 02/08/2017   K 4.0 02/08/2017   CL 105 02/08/2017   CREATININE 0.98 02/08/2017   BUN 18 02/08/2017   CO2 29 02/08/2017   TSH 2.530 06/26/2019    --------------------------------------------------------------------------------------------------------------------- DG PAIN CLINIC C-ARM 1-60 MIN NO REPORT Result Date: 01/09/2024 Fluoro was used, but no Radiologist interpretation will be provided. Please refer to NOTES tab for provider progress note.    Assessment & Plan:   Izetta was seen today for back pain.  Diagnoses and all orders for this visit:  Lumbar spondylosis  Chronic, continuous use of opioids  Chronic pain syndrome  Polycystic kidney disease  Chronic bilateral low back pain with left-sided sciatica  Degeneration of intervertebral disc of lumbar region with discogenic back pain  Right sided sciatica  Other orders -     triamcinolone  acetonide (KENALOG -40) injection 40 mg -     sodium chloride  flush (NS) 0.9 % injection 10 mL -     ropivacaine  (PF) 2 mg/mL (0.2%) (NAROPIN ) injection 10 mL -     lidocaine  (PF) (XYLOCAINE ) 1 % injection 5 mL -     iohexol  (OMNIPAQUE ) 180 MG/ML injection 10 mL        ----------------------------------------------------------------------------------------------------------------------  Problem List Items Addressed This Visit       Unprioritized   Chronic pain syndrome (Chronic)   Lumbar  spondylosis - Primary (Chronic)   Chronic lower back pain   Chronic, continuous use of opioids   Right sided sciatica   Other Visit Diagnoses       Polycystic kidney disease         Degeneration of intervertebral disc of lumbar region with discogenic back pain       Relevant Medications   triamcinolone  acetonide (KENALOG -40) injection 40 mg (Completed)         ----------------------------------------------------------------------------------------------------------------------  1. Lumbar spondylosis (Primary) Will proceed with a lumbar epidural steroid per patient request.  All the risks and benefits have been reviewed questions answered.  She is doing well with this in the past and encouraged that she will do similarly with this.  Hopefully it will enable her chronic opiate therapy to be more effective.  I have encouraged her to continue with stretching strengthening exercises with return to clinic scheduled in 1 month.  2. Chronic, continuous use of opioids As above and I have reviewed the [place]  practitioner database information and it is appropriate for refill.  She has done well with hydrocodone  therapy and is due for refills.  Similarly she has done well with MS Contin  but is having trouble finding this at some of the local pharmacies at the 30 mg or 60 mg strength.  Subsequently we may need to use change her over to Xtampza as an alternative.  3. Chronic pain syndrome As above  4. Polycystic kidney disease Continue follow-up with her nephrologist and primary care physicians for baseline medical care.  5. Chronic bilateral low back pain with left-sided sciatica As above  6. Degeneration of intervertebral disc of lumbar region with discogenic back pain As above  7. Right sided sciatica As above and continue core stretching strengthening exercises with return to clinic in 1  month    ----------------------------------------------------------------------------------------------------------------------  I am having Rosina BIRCH. Jenning maintain her naloxone , Tolvaptan, amLODipine , lisinopril , hydrochlorothiazide, furosemide, benzonatate , promethazine -dextromethorphan, morphine , morphine , morphine , morphine , HYDROcodone -acetaminophen , HYDROcodone -acetaminophen , morphine , and fluticasone . We administered triamcinolone  acetonide, sodium chloride  flush, ropivacaine  (PF) 2 mg/mL (0.2%), lidocaine  (PF), and iohexol .   Meds ordered this encounter  Medications   triamcinolone  acetonide (KENALOG -40) injection 40 mg   sodium chloride  flush (NS) 0.9 % injection 10 mL   ropivacaine  (PF) 2 mg/mL (0.2%) (NAROPIN ) injection 10 mL   lidocaine  (PF) (XYLOCAINE ) 1 % injection 5 mL   iohexol  (OMNIPAQUE ) 180 MG/ML injection 10 mL   Patient's Medications  New Prescriptions   No medications on file  Previous Medications   AMLODIPINE  (NORVASC ) 10 MG TABLET       BENZONATATE  (TESSALON ) 100 MG CAPSULE    Take 1 capsule (100 mg total) by mouth 3 (three) times daily as needed for cough.   FLUTICASONE  (FLONASE ) 50 MCG/ACT NASAL SPRAY    Place 2 sprays into both nostrils daily.   FUROSEMIDE (LASIX) 40 MG TABLET    Take 40 mg by mouth daily.   HYDROCHLOROTHIAZIDE (HYDRODIURIL) 25 MG TABLET    Take 25 mg by mouth daily.   HYDROCODONE -ACETAMINOPHEN  (NORCO) 10-325 MG TABLET    Take 1 tablet by mouth 2 (two) times daily.   HYDROCODONE -ACETAMINOPHEN  (NORCO) 10-325 MG TABLET    Take 1 tablet by mouth every 6 (six) hours as needed for moderate pain (pain score 4-6) or severe pain (pain score 7-10).   LISINOPRIL  (ZESTRIL ) 10 MG TABLET    TAKE 1 TABLET(10 MG) BY MOUTH DAILY   MORPHINE  (MS CONTIN ) 30 MG 12 HR TABLET    Take 2 tablets (60 mg total) by mouth 3 (three) times daily.   MORPHINE  (MS CONTIN ) 60 MG 12 HR TABLET    Take 1 tablet (60 mg total) by mouth 3 (three) times daily.   MORPHINE  (MS  CONTIN) 60 MG 12 HR TABLET    Take 1 tablet (60 mg total) by mouth 3 (three) times daily.   MORPHINE  (MS CONTIN ) 60 MG 12 HR TABLET    Take 1 tablet (60 mg total) by mouth 3 (three) times daily.   MORPHINE  (MS CONTIN ) 60 MG 12 HR TABLET    Take 1 tablet (60 mg total) by mouth 3 (three) times daily.   NALOXONE  (NARCAN ) NASAL SPRAY 4 MG/0.1 ML    For  excess sedation from opioids.   PROMETHAZINE -DEXTROMETHORPHAN (PROMETHAZINE -DM) 6.25-15 MG/5ML SYRUP    Take 5 mLs by mouth 4 (four) times daily as needed for cough.   TOLVAPTAN 90 & 30 MG TBPK    Take 1 tablet by mouth 2 (two) times daily. Takes 60 mg in the AM and 30 mg 8 hours later.  Modified Medications   No medications on file  Discontinued Medications   No medications on file   ----------------------------------------------------------------------------------------------------------------------  Follow-up: Return in about 1 month (around 02/08/2024) for evaluation, med refill.   Procedure: L5-S1 LESI with fluoroscopic guidance and without moderate sedation  NOTE: The risks, benefits, and expectations of the procedure have been discussed and explained to the patient who was understanding and in agreement with suggested treatment plan. No guarantees were made.  DESCRIPTION OF PROCEDURE: Lumbar epidural steroid injection with no IV Versed , EKG, blood pressure, pulse, and pulse oximetry monitoring. The procedure was performed with the patient in the prone position under fluoroscopic guidance.  Sterile prep x3 was initiated and I then injected subcutaneous lidocaine  to the overlying L5-S1 site after its fluoroscopic identifictation.  Using strict aseptic technique, I then advanced an 18-gauge Tuohy epidural needle in the midline using interlaminar approach via loss-of-resistance to saline technique. There was negative aspiration for heme or  CSF.  I then confirmed position with both AP and Lateral fluoroscan.  2 cc of contrast dye were injected and a   total of 5 mL of Preservative-Free normal saline mixed with 40 mg of Kenalog  and 1cc Ropicaine 0.2 percent were injected incrementally via the  epidurally placed needle. The needle was removed. The patient tolerated the injection well and was convalesced and discharged to home in stable condition. Should the patient have any post procedure difficulty they have been instructed on how to contact us  for assistance.   Lynwood KANDICE Clause, MD

## 2024-01-10 NOTE — Telephone Encounter (Signed)
 [facility] [address] told Megan Brock they have Morphine  Sulfate 30s or 60s

## 2024-01-11 ENCOUNTER — Telehealth: Payer: Self-pay

## 2024-01-11 MED ORDER — HYDROCODONE-ACETAMINOPHEN 10-325 MG PO TABS: 1.0000 | ORAL_TABLET | Freq: Four times a day (QID) | ORAL | 0 refills | Status: DC | PRN

## 2024-01-11 MED ORDER — MORPHINE SULFATE ER 60 MG PO TBCR: 60.0000 mg | EXTENDED_RELEASE_TABLET | Freq: Three times a day (TID) | ORAL | 0 refills | Status: AC

## 2024-01-11 MED ORDER — HYDROCODONE-ACETAMINOPHEN 10-325 MG PO TABS: 1.0000 | ORAL_TABLET | Freq: Two times a day (BID) | ORAL | 0 refills | Status: DC

## 2024-01-11 NOTE — Telephone Encounter (Signed)
 Post procedure follow up.  LM

## 2024-02-07 ENCOUNTER — Encounter: Payer: Self-pay | Admitting: Anesthesiology

## 2024-02-07 ENCOUNTER — Ambulatory Visit: Attending: Anesthesiology | Admitting: Anesthesiology

## 2024-02-07 VITALS — BP 113/80 | HR 112 | Temp 98.2°F | Resp 18 | Ht 64.0 in | Wt 170.0 lb

## 2024-02-07 DIAGNOSIS — Q613 Polycystic kidney, unspecified: Secondary | ICD-10-CM | POA: Diagnosis present

## 2024-02-07 DIAGNOSIS — M5136 Other intervertebral disc degeneration, lumbar region with discogenic back pain only: Secondary | ICD-10-CM | POA: Insufficient documentation

## 2024-02-07 DIAGNOSIS — G894 Chronic pain syndrome: Secondary | ICD-10-CM | POA: Insufficient documentation

## 2024-02-07 DIAGNOSIS — M47816 Spondylosis without myelopathy or radiculopathy, lumbar region: Secondary | ICD-10-CM | POA: Diagnosis present

## 2024-02-07 DIAGNOSIS — Z79891 Long term (current) use of opiate analgesic: Secondary | ICD-10-CM

## 2024-02-07 DIAGNOSIS — F1190 Opioid use, unspecified, uncomplicated: Secondary | ICD-10-CM | POA: Diagnosis present

## 2024-02-07 DIAGNOSIS — M5431 Sciatica, right side: Secondary | ICD-10-CM | POA: Diagnosis present

## 2024-02-07 MED ORDER — HYDROCODONE-ACETAMINOPHEN 10-325 MG PO TABS
1.0000 | ORAL_TABLET | Freq: Two times a day (BID) | ORAL | 0 refills | Status: AC
Start: 2024-03-13 — End: 2024-04-12

## 2024-02-07 MED ORDER — HYDROCODONE-ACETAMINOPHEN 10-325 MG PO TABS: 1.0000 | ORAL_TABLET | Freq: Four times a day (QID) | ORAL | 0 refills | Status: AC | PRN

## 2024-02-07 MED ORDER — MORPHINE SULFATE ER 30 MG PO TBCR: 60.0000 mg | EXTENDED_RELEASE_TABLET | Freq: Three times a day (TID) | ORAL | 0 refills | Status: DC

## 2024-02-07 MED ORDER — MORPHINE SULFATE ER 60 MG PO TBCR: 60.0000 mg | EXTENDED_RELEASE_TABLET | Freq: Three times a day (TID) | ORAL | 0 refills | Status: AC

## 2024-02-07 NOTE — Progress Notes (Signed)
 Nursing Pain Medication Assessment:  Safety precautions to be maintained throughout the outpatient stay will include: orient to surroundings, keep bed in low position, maintain call bell within reach at all times, provide assistance with transfer out of bed and ambulation.  Medication Inspection Compliance: Pill count conducted under aseptic conditions, in front of the patient. Neither the pills nor the bottle was removed from the patient's sight at any time. Once count was completed pills were immediately returned to the patient in their original bottle.  Medication #1: Hydrocodone /APAP Pill/Patch Count: 03 of 60 pills/patches remain Pill/Patch Appearance: Markings consistent with prescribed medication Bottle Appearance: Standard pharmacy container. Clearly labeled. Filled Date: 09 / 19 / 2025 Last Medication intake:  Today  Medication #2: Morphine  ER (MSContin) Pill/Patch Count: 18 of 75 pills/patches remain Pill/Patch Appearance: Markings consistent with prescribed medication Bottle Appearance: Standard pharmacy container. Clearly labeled. Filled Date: 09 / 26 / 2025 Last Medication intake:  TodaySafety precautions to be maintained throughout the outpatient stay will include: orient to surroundings, keep bed in low position, maintain call bell within reach at all times, provide assistance with transfer out of bed and ambulation.

## 2024-02-07 NOTE — Progress Notes (Signed)
 Subjective:  Patient ID: Megan Brock, female    DOB: 02-16-1981  Age: 42 y.o. MRN: [id number]  CC: Back Pain (lower)   Procedure: None  HPI Megan Brock presents for reevaluation.  She continues to do well with her hydrocodone  2-3 times a day and her baseline MS Contin  60 mg tablets.  She has been on this regimen for an extended period of time and this continues to keep her chronic low back and flank pain as well as sciatica under control.  Unfortunately she has failed more conservative therapy.  She has progressing polycystic kidney disease followed by Dr. Lazarus.  This condition in addition to her low back pain have created persistent gnawing aching pain affecting the lower back with occasional radiation into the right greater than left lower leg with sciatica symptoms.  She does have degenerative disc disease that adds to this.  The combination of medications generally keeps her pain under good control.  She last had an epidural back in September which gave her improvement in the sciatica symptoms rated at about 80% however the low back pain persists.  Otherwise she is in her usual state of health at this point.  She denies any side effects with the medications.  Outpatient Medications Prior to Visit  Medication Sig Dispense Refill   fluticasone  (FLONASE ) 50 MCG/ACT nasal spray Place 2 sprays into both nostrils daily. 16 g 6   furosemide (LASIX) 40 MG tablet Take 40 mg by mouth daily.     lisinopril  (ZESTRIL ) 10 MG tablet TAKE 1 TABLET(10 MG) BY MOUTH DAILY 14 tablet 0   morphine  (MS CONTIN ) 60 MG 12 hr tablet Take 1 tablet (60 mg total) by mouth 3 (three) times daily. 75 tablet 0   morphine  (MS CONTIN ) 60 MG 12 hr tablet Take 1 tablet (60 mg total) by mouth 3 (three) times daily. 75 tablet 0   morphine  (MS CONTIN ) 60 MG 12 hr tablet Take 1 tablet (60 mg total) by mouth 3 (three) times daily. 75 tablet 0   naloxone  (NARCAN ) nasal spray 4 mg/0.1 mL For excess sedation from opioids. 1 kit  2   Tolvaptan 90 & 30 MG TBPK Take 1 tablet by mouth 2 (two) times daily. Takes 60 mg in the AM and 30 mg 8 hours later.     HYDROcodone -acetaminophen  (NORCO) 10-325 MG tablet Take 1 tablet by mouth every 6 (six) hours as needed for moderate pain (pain score 4-6) or severe pain (pain score 7-10). 60 tablet 0   [START ON 03/05/2024] HYDROcodone -acetaminophen  (NORCO) 10-325 MG tablet Take 1 tablet by mouth 2 (two) times daily. 60 tablet 0   morphine  (MS CONTIN ) 30 MG 12 hr tablet Take 2 tablets (60 mg total) by mouth 3 (three) times daily. 150 tablet 0   morphine  (MS CONTIN ) 60 MG 12 hr tablet Take 1 tablet (60 mg total) by mouth 3 (three) times daily. 75 tablet 0   amLODipine  (NORVASC ) 10 MG tablet      benzonatate  (TESSALON ) 100 MG capsule Take 1 capsule (100 mg total) by mouth 3 (three) times daily as needed for cough. 30 capsule 0   hydrochlorothiazide (HYDRODIURIL) 25 MG tablet Take 25 mg by mouth daily.     promethazine -dextromethorphan (PROMETHAZINE -DM) 6.25-15 MG/5ML syrup Take 5 mLs by mouth 4 (four) times daily as needed for cough. 118 mL 0   No facility-administered medications prior to visit.    Review of Systems CNS: No confusion or sedation Cardiac: No angina  or palpitations GI: No abdominal pain or constipation Constitutional: No nausea vomiting fevers or chills  Objective:  BP 113/80 (Cuff Size: Normal)   Pulse (!) 112   Temp 98.2 F (36.8 C) (Temporal)   Resp 18   Ht 5' 4 (1.626 m)   Wt 170 lb (77.1 kg)   SpO2 98%   BMI 29.18 kg/m    BP Readings from Last 3 Encounters:  02/07/24 113/80  01/09/24 (!) 110/58  02/07/23 132/71     Wt Readings from Last 3 Encounters:  02/07/24 170 lb (77.1 kg)  01/09/24 175 lb (79.4 kg)  02/07/23 170 lb (77.1 kg)     Physical Exam Pt is alert and oriented PERRL EOMI HEART IS RRR no murmur or rub LCTA no wheezing or rales MUSCULOSKELETAL reveals some paraspinous muscle tenderness in the lumbar spine but no overt trigger  points.  She walks with a mildly antalgic gait but muscle tone and bulk are baseline as is strength.  Labs  No results found for: HGBA1C Lab Results  Component Value Date   LDLCALC 105 (H) 07/17/2015   CREATININE 0.98 02/08/2017    -------------------------------------------------------------------------------------------------------------------- Lab Results  Component Value Date   WBC 9.2 02/08/2017   HGB 13.2 02/08/2017   HCT 37.7 02/08/2017   PLT 197 02/08/2017   GLUCOSE 87 02/08/2017   CHOL 174 07/17/2015   TRIG 139 07/17/2015   HDL 41 07/17/2015   LDLCALC 105 (H) 07/17/2015   ALT 10 02/08/2017   AST 12 02/08/2017   NA 140 02/08/2017   K 4.0 02/08/2017   CL 105 02/08/2017   CREATININE 0.98 02/08/2017   BUN 18 02/08/2017   CO2 29 02/08/2017   TSH 2.530 06/26/2019    --------------------------------------------------------------------------------------------------------------------- DG PAIN CLINIC C-ARM 1-60 MIN NO REPORT Result Date: 01/09/2024 Fluoro was used, but no Radiologist interpretation will be provided. Please refer to NOTES tab for provider progress note.    Assessment & Plan:   Megan Brock was seen today for back pain.  Diagnoses and all orders for this visit:  Lumbar spondylosis  Chronic, continuous use of opioids -     ToxASSURE Select 13 (MW), Urine  Chronic pain syndrome -     ToxASSURE Select 13 (MW), Urine  Degeneration of intervertebral disc of lumbar region with discogenic back pain  Polycystic kidney disease  Right sided sciatica  Other orders -     Discontinue: morphine  (MS CONTIN ) 30 MG 12 hr tablet; Take 2 tablets (60 mg total) by mouth 3 (three) times daily. -     morphine  (MS CONTIN ) 60 MG 12 hr tablet; Take 1 tablet (60 mg total) by mouth 3 (three) times daily. -     HYDROcodone -acetaminophen  (NORCO) 10-325 MG tablet; Take 1 tablet by mouth every 6 (six) hours as needed for moderate pain (pain score 4-6) or severe pain (pain  score 7-10). -     HYDROcodone -acetaminophen  (NORCO) 10-325 MG tablet; Take 1 tablet by mouth 2 (two) times daily. -     morphine  (MS CONTIN ) 60 MG 12 hr tablet; Take 1 tablet (60 mg total) by mouth 3 (three) times daily.        ----------------------------------------------------------------------------------------------------------------------  Problem List Items Addressed This Visit       Unprioritized   Chronic pain syndrome (Chronic)   Relevant Medications   morphine  (MS CONTIN ) 60 MG 12 hr tablet (Start on 03/13/2024)   HYDROcodone -acetaminophen  (NORCO) 10-325 MG tablet   HYDROcodone -acetaminophen  (NORCO) 10-325 MG tablet (Start on 03/13/2024)  morphine  (MS CONTIN ) 60 MG 12 hr tablet (Start on 02/12/2024)   Other Relevant Orders   ToxASSURE Select 13 (MW), Urine   Lumbar spondylosis - Primary (Chronic)   Chronic, continuous use of opioids   Relevant Orders   ToxASSURE Select 13 (MW), Urine   Right sided sciatica   Other Visit Diagnoses       Degeneration of intervertebral disc of lumbar region with discogenic back pain       Relevant Medications   morphine  (MS CONTIN ) 60 MG 12 hr tablet (Start on 03/13/2024)   HYDROcodone -acetaminophen  (NORCO) 10-325 MG tablet   HYDROcodone -acetaminophen  (NORCO) 10-325 MG tablet (Start on 03/13/2024)   morphine  (MS CONTIN ) 60 MG 12 hr tablet (Start on 02/12/2024)     Polycystic kidney disease             ----------------------------------------------------------------------------------------------------------------------  1. Lumbar spondylosis (Primary) Continue core stretching strengthening exercises and efforts at weight loss and mobility  2. Chronic, continuous use of opioids I have reviewed the [place]  practitioner database information is appropriate for continuation of her current medications.  Refills will be generated for October 26 and November 25. - ToxASSURE Select 13 (MW), Urine  3. Chronic pain  syndrome As above - ToxASSURE Select 13 (MW), Urine  4. Degeneration of intervertebral disc of lumbar region with discogenic back pain As above  5. Polycystic kidney disease Follow-up with Dr. Lazarus and continue follow-up with her primary care physicians for baseline medical care with return to clinic in 2 months  6. Right sided sciatica As above    ----------------------------------------------------------------------------------------------------------------------  I am having Megan Brock start on morphine . I am also having her maintain her naloxone , Tolvaptan, amLODipine , lisinopril , hydrochlorothiazide, furosemide, benzonatate , promethazine -dextromethorphan, morphine , morphine , fluticasone , morphine , morphine , HYDROcodone -acetaminophen , and HYDROcodone -acetaminophen .   Meds ordered this encounter  Medications   DISCONTD: morphine  (MS CONTIN ) 30 MG 12 hr tablet    Sig: Take 2 tablets (60 mg total) by mouth 3 (three) times daily.    Dispense:  75 tablet    Refill:  0   morphine  (MS CONTIN ) 60 MG 12 hr tablet    Sig: Take 1 tablet (60 mg total) by mouth 3 (three) times daily.    Dispense:  75 tablet    Refill:  0   HYDROcodone -acetaminophen  (NORCO) 10-325 MG tablet    Sig: Take 1 tablet by mouth every 6 (six) hours as needed for moderate pain (pain score 4-6) or severe pain (pain score 7-10).    Dispense:  70 tablet    Refill:  0   HYDROcodone -acetaminophen  (NORCO) 10-325 MG tablet    Sig: Take 1 tablet by mouth 2 (two) times daily.    Dispense:  60 tablet    Refill:  0   morphine  (MS CONTIN ) 60 MG 12 hr tablet    Sig: Take 1 tablet (60 mg total) by mouth 3 (three) times daily.    Dispense:  75 tablet    Refill:  0   Patient's Medications  New Prescriptions   MORPHINE  (MS CONTIN ) 60 MG 12 HR TABLET    Take 1 tablet (60 mg total) by mouth 3 (three) times daily.  Previous Medications   AMLODIPINE  (NORVASC ) 10 MG TABLET       BENZONATATE  (TESSALON ) 100 MG CAPSULE     Take 1 capsule (100 mg total) by mouth 3 (three) times daily as needed for cough.   FLUTICASONE  (FLONASE ) 50 MCG/ACT NASAL SPRAY    Place 2 sprays into both  nostrils daily.   FUROSEMIDE (LASIX) 40 MG TABLET    Take 40 mg by mouth daily.   HYDROCHLOROTHIAZIDE (HYDRODIURIL) 25 MG TABLET    Take 25 mg by mouth daily.   LISINOPRIL  (ZESTRIL ) 10 MG TABLET    TAKE 1 TABLET(10 MG) BY MOUTH DAILY   MORPHINE  (MS CONTIN ) 60 MG 12 HR TABLET    Take 1 tablet (60 mg total) by mouth 3 (three) times daily.   MORPHINE  (MS CONTIN ) 60 MG 12 HR TABLET    Take 1 tablet (60 mg total) by mouth 3 (three) times daily.   MORPHINE  (MS CONTIN ) 60 MG 12 HR TABLET    Take 1 tablet (60 mg total) by mouth 3 (three) times daily.   NALOXONE  (NARCAN ) NASAL SPRAY 4 MG/0.1 ML    For excess sedation from opioids.   PROMETHAZINE -DEXTROMETHORPHAN (PROMETHAZINE -DM) 6.25-15 MG/5ML SYRUP    Take 5 mLs by mouth 4 (four) times daily as needed for cough.   TOLVAPTAN 90 & 30 MG TBPK    Take 1 tablet by mouth 2 (two) times daily. Takes 60 mg in the AM and 30 mg 8 hours later.  Modified Medications   Modified Medication Previous Medication   HYDROCODONE -ACETAMINOPHEN  (NORCO) 10-325 MG TABLET HYDROcodone -acetaminophen  (NORCO) 10-325 MG tablet      Take 1 tablet by mouth every 6 (six) hours as needed for moderate pain (pain score 4-6) or severe pain (pain score 7-10).    Take 1 tablet by mouth every 6 (six) hours as needed for moderate pain (pain score 4-6) or severe pain (pain score 7-10).   HYDROCODONE -ACETAMINOPHEN  (NORCO) 10-325 MG TABLET HYDROcodone -acetaminophen  (NORCO) 10-325 MG tablet      Take 1 tablet by mouth 2 (two) times daily.    Take 1 tablet by mouth 2 (two) times daily.   MORPHINE  (MS CONTIN ) 60 MG 12 HR TABLET morphine  (MS CONTIN ) 60 MG 12 hr tablet      Take 1 tablet (60 mg total) by mouth 3 (three) times daily.    Take 1 tablet (60 mg total) by mouth 3 (three) times daily.  Discontinued Medications   MORPHINE  (MS CONTIN ) 30 MG  12 HR TABLET    Take 2 tablets (60 mg total) by mouth 3 (three) times daily.   ----------------------------------------------------------------------------------------------------------------------  Follow-up: No follow-ups on file.    Lynwood KANDICE Clause, MD

## 2024-02-10 LAB — TOXASSURE SELECT 13 (MW), URINE

## 2024-02-13 ENCOUNTER — Telehealth: Admitting: Physician Assistant

## 2024-02-13 DIAGNOSIS — M546 Pain in thoracic spine: Secondary | ICD-10-CM

## 2024-02-13 DIAGNOSIS — R0602 Shortness of breath: Secondary | ICD-10-CM

## 2024-02-13 DIAGNOSIS — B9689 Other specified bacterial agents as the cause of diseases classified elsewhere: Secondary | ICD-10-CM

## 2024-02-14 NOTE — Progress Notes (Signed)
  Because of need for examination of the lungs and potential imaging, I feel your condition warrants further evaluation and I recommend that you be seen in a face-to-face visit.   NOTE: There will be NO CHARGE for this E-Visit   If you are having a true medical emergency, please call 911.     For an urgent face to face visit, [facility] has multiple urgent care centers for your convenience.  Click the link below for the full list of locations and hours, walk-in wait times, appointment scheduling options and driving directions:  Urgent Care - [place], [place], [place], [place], [place], [place]  [place]     Your MyChart E-visit questionnaire answers were reviewed by a board certified advanced clinical practitioner to complete your personal care plan based on your specific symptoms.    Thank you for using e-Visits.

## 2024-04-04 ENCOUNTER — Encounter: Payer: Self-pay | Admitting: Anesthesiology

## 2024-04-04 ENCOUNTER — Ambulatory Visit: Attending: Anesthesiology | Admitting: Anesthesiology

## 2024-04-04 VITALS — BP 137/83 | HR 119 | Temp 98.5°F | Resp 16 | Ht 64.0 in | Wt 170.0 lb

## 2024-04-04 DIAGNOSIS — M5442 Lumbago with sciatica, left side: Secondary | ICD-10-CM | POA: Insufficient documentation

## 2024-04-04 DIAGNOSIS — M47816 Spondylosis without myelopathy or radiculopathy, lumbar region: Secondary | ICD-10-CM | POA: Insufficient documentation

## 2024-04-04 DIAGNOSIS — M5441 Lumbago with sciatica, right side: Secondary | ICD-10-CM | POA: Diagnosis not present

## 2024-04-04 DIAGNOSIS — M5136 Other intervertebral disc degeneration, lumbar region with discogenic back pain only: Secondary | ICD-10-CM | POA: Insufficient documentation

## 2024-04-04 DIAGNOSIS — Z79891 Long term (current) use of opiate analgesic: Secondary | ICD-10-CM

## 2024-04-04 DIAGNOSIS — G8929 Other chronic pain: Secondary | ICD-10-CM | POA: Diagnosis present

## 2024-04-04 DIAGNOSIS — Q613 Polycystic kidney, unspecified: Secondary | ICD-10-CM | POA: Insufficient documentation

## 2024-04-04 DIAGNOSIS — F1190 Opioid use, unspecified, uncomplicated: Secondary | ICD-10-CM | POA: Insufficient documentation

## 2024-04-04 DIAGNOSIS — G894 Chronic pain syndrome: Secondary | ICD-10-CM | POA: Diagnosis present

## 2024-04-04 DIAGNOSIS — M5431 Sciatica, right side: Secondary | ICD-10-CM | POA: Insufficient documentation

## 2024-04-04 MED ORDER — MORPHINE SULFATE ER 60 MG PO TBCR: 60.0000 mg | EXTENDED_RELEASE_TABLET | Freq: Three times a day (TID) | ORAL | 0 refills | Status: AC

## 2024-04-04 MED ORDER — HYDROCODONE-ACETAMINOPHEN 10-325 MG PO TABS: 1.0000 | ORAL_TABLET | Freq: Two times a day (BID) | ORAL | 0 refills | Status: AC

## 2024-04-04 NOTE — Progress Notes (Unsigned)
 Nursing Pain Medication Assessment:  Safety precautions to be maintained throughout the outpatient stay will include: orient to surroundings, keep bed in low position, maintain call bell within reach at all times, provide assistance with transfer out of bed and ambulation.  Medication Inspection Compliance: Pill count conducted under aseptic conditions, in front of the patient. Neither the pills nor the bottle was removed from the patient's sight at any time. Once count was completed pills were immediately returned to the patient in their original bottle.  Medication #1: Hydrocodone /APAP Pill/Patch Count: 21 of 60 pills/patches remain Pill/Patch Appearance: Markings consistent with prescribed medication Bottle Appearance: Standard pharmacy container. Clearly labeled. Filled Date: 64 / 25 / 2025 Last Medication intake:  Yesterday  Medication #2: Morphine  ER (MSContin) Pill/Patch Count: 21 of 75 pills/patches remain Pill/Patch Appearance: Markings consistent with prescribed medication Bottle Appearance: Standard pharmacy container. Clearly labeled. Filled Date: 45 / 25 / 2025 Last Medication intake:  Today

## 2024-04-05 NOTE — Progress Notes (Signed)
 Subjective:  Patient ID: Megan Brock, female    DOB: November 01, 1980  Age: 43 y.o. MRN: [id number]  CC: Other (Sciatica bilateral more on the right ) and Back Pain (Lumbar bilateral )   Procedure: Home  HPI Megan Brock presents for reevaluation.   Megan Brock's last evaluation was approximately 2 months ago and she presents today for reevaluation.  Megan Brock right lower extremity sciatica has continued to respond favorably to the most recent epidural several months ago.  She still has persistent low back pain and flank pain secondary to Megan Brock polycystic kidney disease and ongoing degenerative disc disease.  Secondary to the diminished GFR she cannot take anti-inflammatory medications and takes Megan Brock chronic opiate therapy to keep Megan Brock pain under control.  This works well for Megan Brock.  She denies any side effects of the medication and continues to get approximately 75 to 80% relief from the baseline opioid in addition to the acute on chronic short acting medications.  At present she takes Megan Brock hydrocodone  10 mg tablets twice a day in addition to Megan Brock baseline MS Contin .  No change in the quality characteristic or distribution of Megan Brock pain is noted.  Outpatient Medications Prior to Visit  Medication Sig Dispense Refill   amLODipine  (NORVASC ) 10 MG tablet      benzonatate  (TESSALON ) 100 MG capsule Take 1 capsule (100 mg total) by mouth 3 (three) times daily as needed for cough. 30 capsule 0   fluticasone  (FLONASE ) 50 MCG/ACT nasal spray Place 2 sprays into both nostrils daily. 16 g 6   furosemide (LASIX) 40 MG tablet Take 40 mg by mouth daily.     hydrochlorothiazide (HYDRODIURIL) 25 MG tablet Take 25 mg by mouth daily.     lisinopril  (ZESTRIL ) 10 MG tablet TAKE 1 TABLET(10 MG) BY MOUTH DAILY 14 tablet 0   morphine  (MS CONTIN ) 60 MG 12 hr tablet Take 1 tablet (60 mg total) by mouth 3 (three) times daily. 75 tablet 0   morphine  (MS CONTIN ) 60 MG 12 hr tablet Take 1 tablet (60 mg total) by mouth 3 (three) times daily.  75 tablet 0   morphine  (MS CONTIN ) 60 MG 12 hr tablet Take 1 tablet (60 mg total) by mouth 3 (three) times daily. 75 tablet 0   naloxone  (NARCAN ) nasal spray 4 mg/0.1 mL For excess sedation from opioids. 1 kit 2   promethazine -dextromethorphan (PROMETHAZINE -DM) 6.25-15 MG/5ML syrup Take 5 mLs by mouth 4 (four) times daily as needed for cough. 118 mL 0   Tolvaptan 90 & 30 MG TBPK Take 1 tablet by mouth 2 (two) times daily. Takes 60 mg in the AM and 30 mg 8 hours later.     HYDROcodone -acetaminophen  (NORCO) 10-325 MG tablet Take 1 tablet by mouth 2 (two) times daily. 60 tablet 0   morphine  (MS CONTIN ) 60 MG 12 hr tablet Take 1 tablet (60 mg total) by mouth 3 (three) times daily. 75 tablet 0   morphine  (MS CONTIN ) 60 MG 12 hr tablet Take 1 tablet (60 mg total) by mouth 3 (three) times daily. 75 tablet 0   No facility-administered medications prior to visit.    Review of Systems CNS: No confusion or sedation Cardiac: No angina or palpitations GI: No abdominal pain or constipation Constitutional: No nausea vomiting fevers or chills  Objective:  BP 137/83 (BP Location: Right Arm, Patient Position: Sitting, Cuff Size: Normal)   Pulse (!) 119   Temp 98.5 F (36.9 C) (Temporal)   Resp 16   Ht  5' 4 (1.626 m)   Wt 170 lb (77.1 kg)   SpO2 96%   BMI 29.18 kg/m    BP Readings from Last 3 Encounters:  04/04/24 137/83  02/07/24 113/80  01/09/24 (!) 110/58     Wt Readings from Last 3 Encounters:  04/04/24 170 lb (77.1 kg)  02/07/24 170 lb (77.1 kg)  01/09/24 175 lb (79.4 kg)     Physical Exam Pt is alert and oriented PERRL EOMI HEART IS RRR no murmur or rub LCTA no wheezing or rales MUSCULOSKELETAL reveals some paraspinous muscle tenderness bilaterally in the lumbar region and flank region.  No overt trigger points.  She walks with a mildly antalgic gait but has good strength muscle tone and bulk throughout the lower extremities.  Negative straight leg raise on the right  side.  Labs  No results found for: HGBA1C Lab Results  Component Value Date   LDLCALC 105 (H) 07/17/2015   CREATININE 0.98 02/08/2017    -------------------------------------------------------------------------------------------------------------------- Lab Results  Component Value Date   WBC 9.2 02/08/2017   HGB 13.2 02/08/2017   HCT 37.7 02/08/2017   PLT 197 02/08/2017   GLUCOSE 87 02/08/2017   CHOL 174 07/17/2015   TRIG 139 07/17/2015   HDL 41 07/17/2015   LDLCALC 105 (H) 07/17/2015   ALT 10 02/08/2017   AST 12 02/08/2017   NA 140 02/08/2017   K 4.0 02/08/2017   CL 105 02/08/2017   CREATININE 0.98 02/08/2017   BUN 18 02/08/2017   CO2 29 02/08/2017   TSH 2.530 06/26/2019    --------------------------------------------------------------------------------------------------------------------- DG PAIN CLINIC C-ARM 1-60 MIN NO REPORT Result Date: 01/09/2024 Fluoro was used, but no Radiologist interpretation will be provided. Please refer to NOTES tab for provider progress note.    Assessment & Plan:   Megan Brock was seen today for other and back pain.  Diagnoses and all orders for this visit:  Lumbar spondylosis  Chronic, continuous use of opioids  Chronic pain syndrome  Degeneration of intervertebral disc of lumbar region with discogenic back pain  Polycystic kidney disease  Right sided sciatica  Chronic bilateral low back pain with left-sided sciatica  Other orders -     HYDROcodone -acetaminophen  (NORCO) 10-325 MG tablet; Take 1 tablet by mouth 2 (two) times daily. -     HYDROcodone -acetaminophen  (NORCO) 10-325 MG tablet; Take 1 tablet by mouth 2 (two) times daily. -     morphine  (MS CONTIN ) 60 MG 12 hr tablet; Take 1 tablet (60 mg total) by mouth 3 (three) times daily. -     morphine  (MS CONTIN ) 60 MG 12 hr tablet; Take 1 tablet (60 mg total) by mouth 3 (three) times  daily.        ----------------------------------------------------------------------------------------------------------------------  Problem List Items Addressed This Visit       Unprioritized   Chronic pain syndrome (Chronic)   Relevant Medications   HYDROcodone -acetaminophen  (NORCO) 10-325 MG tablet (Start on 04/11/2024)   HYDROcodone -acetaminophen  (NORCO) 10-325 MG tablet (Start on 05/12/2024)   morphine  (MS CONTIN ) 60 MG 12 hr tablet (Start on 04/11/2024)   morphine  (MS CONTIN ) 60 MG 12 hr tablet (Start on 05/12/2024)   Lumbar spondylosis - Primary (Chronic)   Chronic lower back pain   Relevant Medications   HYDROcodone -acetaminophen  (NORCO) 10-325 MG tablet (Start on 04/11/2024)   HYDROcodone -acetaminophen  (NORCO) 10-325 MG tablet (Start on 05/12/2024)   morphine  (MS CONTIN ) 60 MG 12 hr tablet (Start on 04/11/2024)   morphine  (MS CONTIN ) 60 MG 12 hr tablet (Start on  05/12/2024)   Chronic, continuous use of opioids   Right sided sciatica   Other Visit Diagnoses       Degeneration of intervertebral disc of lumbar region with discogenic back pain       Relevant Medications   HYDROcodone -acetaminophen  (NORCO) 10-325 MG tablet (Start on 04/11/2024)   HYDROcodone -acetaminophen  (NORCO) 10-325 MG tablet (Start on 05/12/2024)   morphine  (MS CONTIN ) 60 MG 12 hr tablet (Start on 04/11/2024)   morphine  (MS CONTIN ) 60 MG 12 hr tablet (Start on 05/12/2024)     Polycystic kidney disease             ----------------------------------------------------------------------------------------------------------------------  1. Lumbar spondylosis (Primary) Based on Megan Brock current status of encouraged Megan Brock to continue with Megan Brock stretching strengthening exercises as she is already doing.  2. Chronic, continuous use of opioids Will keep Megan Brock on Megan Brock current regimen.  Refills will be generated for December 24 and January 24 with return to clinic in 2 months.  I have reviewed the [place]   practitioner database information is appropriate for refill.  3. Chronic pain syndrome As above  4. Degeneration of intervertebral disc of lumbar region with discogenic back pain   5. Polycystic kidney disease Continue follow-up with Dr. Lazarus  6. Right sided sciatica   7. Chronic bilateral low back pain with left-sided sciatica     ----------------------------------------------------------------------------------------------------------------------  I am having Megan Brock. Georg start on HYDROcodone -acetaminophen . I am also having Megan Brock maintain Megan Brock naloxone , Tolvaptan, amLODipine , lisinopril , hydrochlorothiazide, furosemide, benzonatate , promethazine -dextromethorphan, fluticasone , morphine , morphine , morphine , HYDROcodone -acetaminophen , morphine , and morphine .   Meds ordered this encounter  Medications   HYDROcodone -acetaminophen  (NORCO) 10-325 MG tablet    Sig: Take 1 tablet by mouth 2 (two) times daily.    Dispense:  60 tablet    Refill:  0   HYDROcodone -acetaminophen  (NORCO) 10-325 MG tablet    Sig: Take 1 tablet by mouth 2 (two) times daily.    Dispense:  60 tablet    Refill:  0   morphine  (MS CONTIN ) 60 MG 12 hr tablet    Sig: Take 1 tablet (60 mg total) by mouth 3 (three) times daily.    Dispense:  75 tablet    Refill:  0   morphine  (MS CONTIN ) 60 MG 12 hr tablet    Sig: Take 1 tablet (60 mg total) by mouth 3 (three) times daily.    Dispense:  75 tablet    Refill:  0   Patient's Medications  New Prescriptions   HYDROCODONE -ACETAMINOPHEN  (NORCO) 10-325 MG TABLET    Take 1 tablet by mouth 2 (two) times daily.  Previous Medications   AMLODIPINE  (NORVASC ) 10 MG TABLET       BENZONATATE  (TESSALON ) 100 MG CAPSULE    Take 1 capsule (100 mg total) by mouth 3 (three) times daily as needed for cough.   FLUTICASONE  (FLONASE ) 50 MCG/ACT NASAL SPRAY    Place 2 sprays into both nostrils daily.   FUROSEMIDE (LASIX) 40 MG TABLET    Take 40 mg by mouth daily.    HYDROCHLOROTHIAZIDE (HYDRODIURIL) 25 MG TABLET    Take 25 mg by mouth daily.   LISINOPRIL  (ZESTRIL ) 10 MG TABLET    TAKE 1 TABLET(10 MG) BY MOUTH DAILY   MORPHINE  (MS CONTIN ) 60 MG 12 HR TABLET    Take 1 tablet (60 mg total) by mouth 3 (three) times daily.   MORPHINE  (MS CONTIN ) 60 MG 12 HR TABLET    Take 1 tablet (60 mg total) by mouth 3 (three)  times daily.   MORPHINE  (MS CONTIN ) 60 MG 12 HR TABLET    Take 1 tablet (60 mg total) by mouth 3 (three) times daily.   NALOXONE  (NARCAN ) NASAL SPRAY 4 MG/0.1 ML    For excess sedation from opioids.   PROMETHAZINE -DEXTROMETHORPHAN (PROMETHAZINE -DM) 6.25-15 MG/5ML SYRUP    Take 5 mLs by mouth 4 (four) times daily as needed for cough.   TOLVAPTAN 90 & 30 MG TBPK    Take 1 tablet by mouth 2 (two) times daily. Takes 60 mg in the AM and 30 mg 8 hours later.  Modified Medications   Modified Medication Previous Medication   HYDROCODONE -ACETAMINOPHEN  (NORCO) 10-325 MG TABLET HYDROcodone -acetaminophen  (NORCO) 10-325 MG tablet      Take 1 tablet by mouth 2 (two) times daily.    Take 1 tablet by mouth 2 (two) times daily.   MORPHINE  (MS CONTIN ) 60 MG 12 HR TABLET morphine  (MS CONTIN ) 60 MG 12 hr tablet      Take 1 tablet (60 mg total) by mouth 3 (three) times daily.    Take 1 tablet (60 mg total) by mouth 3 (three) times daily.   MORPHINE  (MS CONTIN ) 60 MG 12 HR TABLET morphine  (MS CONTIN ) 60 MG 12 hr tablet      Take 1 tablet (60 mg total) by mouth 3 (three) times daily.    Take 1 tablet (60 mg total) by mouth 3 (three) times daily.  Discontinued Medications   No medications on file   ----------------------------------------------------------------------------------------------------------------------  Follow-up: Return in about 2 months (around 06/05/2024) for evaluation, med refill.  Continue follow-up with Megan Brock primary care physicians for baseline medical care.  Lynwood KANDICE Clause, MD

## 2024-06-05 ENCOUNTER — Telehealth: Admitting: Anesthesiology
